# Patient Record
Sex: Female | Born: 1945 | Race: Black or African American | Hispanic: No | Marital: Married | Smoking: Never smoker
Health system: Southern US, Community
[De-identification: ages and names within clinical notes are randomized; demographics above are authoritative.]

## PROBLEM LIST (undated history)

## (undated) DIAGNOSIS — I129 Hypertensive chronic kidney disease with stage 1 through stage 4 chronic kidney disease, or unspecified chronic kidney disease: Secondary | ICD-10-CM

## (undated) DIAGNOSIS — E119 Type 2 diabetes mellitus without complications: Secondary | ICD-10-CM

## (undated) DIAGNOSIS — E7800 Pure hypercholesterolemia, unspecified: Secondary | ICD-10-CM

## (undated) DIAGNOSIS — I10 Essential (primary) hypertension: Secondary | ICD-10-CM

## (undated) DIAGNOSIS — I8390 Asymptomatic varicose veins of unspecified lower extremity: Secondary | ICD-10-CM

## (undated) DIAGNOSIS — H8110 Benign paroxysmal vertigo, unspecified ear: Secondary | ICD-10-CM

## (undated) DIAGNOSIS — M1990 Unspecified osteoarthritis, unspecified site: Secondary | ICD-10-CM

## (undated) DIAGNOSIS — Z973 Presence of spectacles and contact lenses: Secondary | ICD-10-CM

## (undated) DIAGNOSIS — E785 Hyperlipidemia, unspecified: Secondary | ICD-10-CM

## (undated) DIAGNOSIS — N183 Chronic kidney disease, stage 3 unspecified: Secondary | ICD-10-CM

## (undated) DIAGNOSIS — N189 Chronic kidney disease, unspecified: Secondary | ICD-10-CM

## (undated) HISTORY — DX: Pure hypercholesterolemia, unspecified: E78.00

## (undated) HISTORY — DX: Essential (primary) hypertension: I10

## (undated) HISTORY — DX: Unspecified osteoarthritis, unspecified site: M19.90

## (undated) HISTORY — DX: Benign paroxysmal vertigo, unspecified ear: H81.10

## (undated) HISTORY — PX: VAGINAL HYSTERECTOMY: SUR661

## (undated) HISTORY — PX: ABDOMINAL HYSTERECTOMY: SHX81

## (undated) HISTORY — DX: Chronic kidney disease, unspecified: N18.9

## (undated) HISTORY — DX: Type 2 diabetes mellitus without complications: E11.9

## (undated) HISTORY — DX: Asymptomatic varicose veins of unspecified lower extremity: I83.90

---

## 1997-10-28 ENCOUNTER — Other Ambulatory Visit: Admission: RE | Admit: 1997-10-28 | Discharge: 1997-10-28 | Payer: Self-pay | Admitting: Gynecology

## 1999-06-30 ENCOUNTER — Encounter: Payer: Self-pay | Admitting: Gynecology

## 1999-06-30 ENCOUNTER — Encounter: Admission: RE | Admit: 1999-06-30 | Discharge: 1999-06-30 | Payer: Self-pay | Admitting: Gynecology

## 2002-08-20 ENCOUNTER — Emergency Department (HOSPITAL_COMMUNITY): Admission: EM | Admit: 2002-08-20 | Discharge: 2002-08-20 | Payer: Self-pay | Admitting: *Deleted

## 2002-08-20 ENCOUNTER — Encounter: Payer: Self-pay | Admitting: *Deleted

## 2005-01-05 ENCOUNTER — Encounter: Admission: RE | Admit: 2005-01-05 | Discharge: 2005-01-05 | Payer: Self-pay | Admitting: Internal Medicine

## 2005-01-14 ENCOUNTER — Encounter: Admission: RE | Admit: 2005-01-14 | Discharge: 2005-01-14 | Payer: Self-pay | Admitting: Internal Medicine

## 2012-09-01 DIAGNOSIS — M25559 Pain in unspecified hip: Secondary | ICD-10-CM | POA: Diagnosis not present

## 2012-09-01 DIAGNOSIS — Z79899 Other long term (current) drug therapy: Secondary | ICD-10-CM | POA: Diagnosis not present

## 2012-09-01 DIAGNOSIS — E559 Vitamin D deficiency, unspecified: Secondary | ICD-10-CM | POA: Diagnosis not present

## 2012-09-01 DIAGNOSIS — I10 Essential (primary) hypertension: Secondary | ICD-10-CM | POA: Diagnosis not present

## 2012-10-13 DIAGNOSIS — Z1231 Encounter for screening mammogram for malignant neoplasm of breast: Secondary | ICD-10-CM | POA: Diagnosis not present

## 2013-01-08 DIAGNOSIS — I10 Essential (primary) hypertension: Secondary | ICD-10-CM | POA: Diagnosis not present

## 2013-01-08 DIAGNOSIS — R209 Unspecified disturbances of skin sensation: Secondary | ICD-10-CM | POA: Diagnosis not present

## 2013-05-24 DIAGNOSIS — R42 Dizziness and giddiness: Secondary | ICD-10-CM | POA: Diagnosis not present

## 2013-05-24 DIAGNOSIS — E7800 Pure hypercholesterolemia, unspecified: Secondary | ICD-10-CM | POA: Diagnosis not present

## 2013-05-24 DIAGNOSIS — E559 Vitamin D deficiency, unspecified: Secondary | ICD-10-CM | POA: Diagnosis not present

## 2013-05-24 DIAGNOSIS — Z0000 Encounter for general adult medical examination without abnormal findings: Secondary | ICD-10-CM | POA: Diagnosis not present

## 2013-05-24 DIAGNOSIS — I10 Essential (primary) hypertension: Secondary | ICD-10-CM | POA: Diagnosis not present

## 2013-05-25 DIAGNOSIS — I10 Essential (primary) hypertension: Secondary | ICD-10-CM | POA: Diagnosis not present

## 2013-05-25 DIAGNOSIS — E7800 Pure hypercholesterolemia, unspecified: Secondary | ICD-10-CM | POA: Diagnosis not present

## 2013-05-25 DIAGNOSIS — Z006 Encounter for examination for normal comparison and control in clinical research program: Secondary | ICD-10-CM | POA: Diagnosis not present

## 2013-10-15 DIAGNOSIS — I129 Hypertensive chronic kidney disease with stage 1 through stage 4 chronic kidney disease, or unspecified chronic kidney disease: Secondary | ICD-10-CM | POA: Diagnosis not present

## 2013-10-15 DIAGNOSIS — M25559 Pain in unspecified hip: Secondary | ICD-10-CM | POA: Diagnosis not present

## 2013-10-15 DIAGNOSIS — E1129 Type 2 diabetes mellitus with other diabetic kidney complication: Secondary | ICD-10-CM | POA: Diagnosis not present

## 2013-10-15 DIAGNOSIS — N058 Unspecified nephritic syndrome with other morphologic changes: Secondary | ICD-10-CM | POA: Diagnosis not present

## 2013-10-15 DIAGNOSIS — N183 Chronic kidney disease, stage 3 unspecified: Secondary | ICD-10-CM | POA: Diagnosis not present

## 2013-10-16 DIAGNOSIS — Z1231 Encounter for screening mammogram for malignant neoplasm of breast: Secondary | ICD-10-CM | POA: Diagnosis not present

## 2013-11-06 DIAGNOSIS — M1610 Unilateral primary osteoarthritis, unspecified hip: Secondary | ICD-10-CM | POA: Diagnosis not present

## 2013-11-06 DIAGNOSIS — M169 Osteoarthritis of hip, unspecified: Secondary | ICD-10-CM | POA: Diagnosis not present

## 2013-11-13 DIAGNOSIS — E7800 Pure hypercholesterolemia, unspecified: Secondary | ICD-10-CM | POA: Diagnosis not present

## 2013-11-13 DIAGNOSIS — Z79899 Other long term (current) drug therapy: Secondary | ICD-10-CM | POA: Diagnosis not present

## 2013-11-19 DIAGNOSIS — R42 Dizziness and giddiness: Secondary | ICD-10-CM | POA: Diagnosis not present

## 2013-11-19 DIAGNOSIS — H9319 Tinnitus, unspecified ear: Secondary | ICD-10-CM | POA: Diagnosis not present

## 2013-11-19 DIAGNOSIS — H8110 Benign paroxysmal vertigo, unspecified ear: Secondary | ICD-10-CM | POA: Diagnosis not present

## 2014-02-14 DIAGNOSIS — N183 Chronic kidney disease, stage 3 unspecified: Secondary | ICD-10-CM | POA: Diagnosis not present

## 2014-02-14 DIAGNOSIS — E1129 Type 2 diabetes mellitus with other diabetic kidney complication: Secondary | ICD-10-CM | POA: Diagnosis not present

## 2014-02-14 DIAGNOSIS — N058 Unspecified nephritic syndrome with other morphologic changes: Secondary | ICD-10-CM | POA: Diagnosis not present

## 2014-02-14 DIAGNOSIS — E1165 Type 2 diabetes mellitus with hyperglycemia: Secondary | ICD-10-CM | POA: Diagnosis not present

## 2014-02-14 DIAGNOSIS — I129 Hypertensive chronic kidney disease with stage 1 through stage 4 chronic kidney disease, or unspecified chronic kidney disease: Secondary | ICD-10-CM | POA: Diagnosis not present

## 2014-05-15 DIAGNOSIS — E876 Hypokalemia: Secondary | ICD-10-CM | POA: Diagnosis not present

## 2014-07-08 DIAGNOSIS — M25561 Pain in right knee: Secondary | ICD-10-CM | POA: Diagnosis not present

## 2014-07-08 DIAGNOSIS — E1121 Type 2 diabetes mellitus with diabetic nephropathy: Secondary | ICD-10-CM | POA: Diagnosis not present

## 2014-07-08 DIAGNOSIS — N390 Urinary tract infection, site not specified: Secondary | ICD-10-CM | POA: Diagnosis not present

## 2014-07-08 DIAGNOSIS — I129 Hypertensive chronic kidney disease with stage 1 through stage 4 chronic kidney disease, or unspecified chronic kidney disease: Secondary | ICD-10-CM | POA: Diagnosis not present

## 2014-07-08 DIAGNOSIS — Z13820 Encounter for screening for osteoporosis: Secondary | ICD-10-CM | POA: Diagnosis not present

## 2014-07-08 DIAGNOSIS — Z0000 Encounter for general adult medical examination without abnormal findings: Secondary | ICD-10-CM | POA: Diagnosis not present

## 2014-07-08 DIAGNOSIS — N183 Chronic kidney disease, stage 3 (moderate): Secondary | ICD-10-CM | POA: Diagnosis not present

## 2014-07-08 DIAGNOSIS — M85852 Other specified disorders of bone density and structure, left thigh: Secondary | ICD-10-CM | POA: Diagnosis not present

## 2014-10-07 DIAGNOSIS — E1122 Type 2 diabetes mellitus with diabetic chronic kidney disease: Secondary | ICD-10-CM | POA: Diagnosis not present

## 2014-10-07 DIAGNOSIS — N08 Glomerular disorders in diseases classified elsewhere: Secondary | ICD-10-CM | POA: Diagnosis not present

## 2014-10-07 DIAGNOSIS — Z23 Encounter for immunization: Secondary | ICD-10-CM | POA: Diagnosis not present

## 2014-10-07 DIAGNOSIS — N183 Chronic kidney disease, stage 3 (moderate): Secondary | ICD-10-CM | POA: Diagnosis not present

## 2014-10-07 DIAGNOSIS — I129 Hypertensive chronic kidney disease with stage 1 through stage 4 chronic kidney disease, or unspecified chronic kidney disease: Secondary | ICD-10-CM | POA: Diagnosis not present

## 2014-10-18 DIAGNOSIS — Z1231 Encounter for screening mammogram for malignant neoplasm of breast: Secondary | ICD-10-CM | POA: Diagnosis not present

## 2015-02-05 DIAGNOSIS — E1122 Type 2 diabetes mellitus with diabetic chronic kidney disease: Secondary | ICD-10-CM | POA: Diagnosis not present

## 2015-02-05 DIAGNOSIS — I129 Hypertensive chronic kidney disease with stage 1 through stage 4 chronic kidney disease, or unspecified chronic kidney disease: Secondary | ICD-10-CM | POA: Diagnosis not present

## 2015-02-05 DIAGNOSIS — N183 Chronic kidney disease, stage 3 (moderate): Secondary | ICD-10-CM | POA: Diagnosis not present

## 2015-02-05 DIAGNOSIS — N08 Glomerular disorders in diseases classified elsewhere: Secondary | ICD-10-CM | POA: Diagnosis not present

## 2015-02-19 DIAGNOSIS — M1990 Unspecified osteoarthritis, unspecified site: Secondary | ICD-10-CM | POA: Diagnosis not present

## 2015-02-20 ENCOUNTER — Other Ambulatory Visit: Payer: Self-pay | Admitting: Internal Medicine

## 2015-02-20 DIAGNOSIS — R200 Anesthesia of skin: Secondary | ICD-10-CM

## 2015-02-26 ENCOUNTER — Other Ambulatory Visit: Payer: Self-pay | Admitting: *Deleted

## 2015-02-26 DIAGNOSIS — I83813 Varicose veins of bilateral lower extremities with pain: Secondary | ICD-10-CM

## 2015-02-27 ENCOUNTER — Encounter: Payer: Self-pay | Admitting: Vascular Surgery

## 2015-04-21 ENCOUNTER — Encounter: Payer: Self-pay | Admitting: Vascular Surgery

## 2015-04-23 ENCOUNTER — Ambulatory Visit (HOSPITAL_COMMUNITY)
Admission: RE | Admit: 2015-04-23 | Discharge: 2015-04-23 | Disposition: A | Discharge status: Home / Self Care | Payer: Medicare Other | Source: Ambulatory Visit | Attending: Vascular Surgery | Admitting: Vascular Surgery

## 2015-04-23 ENCOUNTER — Ambulatory Visit (INDEPENDENT_AMBULATORY_CARE_PROVIDER_SITE_OTHER): Payer: Medicare Other | Admitting: Vascular Surgery

## 2015-04-23 ENCOUNTER — Encounter: Payer: Self-pay | Admitting: Vascular Surgery

## 2015-04-23 VITALS — BP 121/70 | HR 61 | Temp 98.3°F | Resp 16 | Ht 62.0 in | Wt 163.0 lb

## 2015-04-23 DIAGNOSIS — E119 Type 2 diabetes mellitus without complications: Secondary | ICD-10-CM | POA: Insufficient documentation

## 2015-04-23 DIAGNOSIS — N189 Chronic kidney disease, unspecified: Secondary | ICD-10-CM | POA: Diagnosis not present

## 2015-04-23 DIAGNOSIS — E7800 Pure hypercholesterolemia, unspecified: Secondary | ICD-10-CM | POA: Diagnosis not present

## 2015-04-23 DIAGNOSIS — I129 Hypertensive chronic kidney disease with stage 1 through stage 4 chronic kidney disease, or unspecified chronic kidney disease: Secondary | ICD-10-CM | POA: Diagnosis not present

## 2015-04-23 DIAGNOSIS — I8390 Asymptomatic varicose veins of unspecified lower extremity: Secondary | ICD-10-CM

## 2015-04-23 DIAGNOSIS — M25562 Pain in left knee: Secondary | ICD-10-CM | POA: Diagnosis not present

## 2015-04-23 DIAGNOSIS — I83813 Varicose veins of bilateral lower extremities with pain: Secondary | ICD-10-CM | POA: Diagnosis not present

## 2015-04-23 DIAGNOSIS — M25561 Pain in right knee: Secondary | ICD-10-CM | POA: Diagnosis not present

## 2015-04-23 HISTORY — DX: Asymptomatic varicose veins of unspecified lower extremity: I83.90

## 2015-04-23 NOTE — Progress Notes (Signed)
[facility] [place]  Patient name: Betty Arroyo Lex MRN: [id number] DOB: 1945/08/01 Sex: female  REASON FOR CONSULT: Bilateral leg pain. Referred by Dr. Dorothyann Pengobyn Sanders.   HPI: Betty Arroyo [place] is a 69 y.o. female, who has had pain in both lower extremities for a couple of years now. She describes mostly pain around her right knee and on the lateral aspect of her right knee and also some in the right hip. She has similar symptoms on the left side but more significantly on the right side. She does have some spider veins in her medial distal right thigh and giving these findings was sent for vascular evaluation.  She denies any history of claudication, rest pain, or nonhealing ulcers. No previous history of DVT or phlebitis.  I have reviewed her records that were sent with her from [facility]. She does have a history of diabetes and hypertension. She also has stage III chronic kidney disease.   Labs show an HDL of 45 with an LDL of 131. This was on 02/05/2015. GFR is greater than 60.   Past Medical History  Diagnosis Date  . Hypertension   . Hypercholesteremia   . Chronic kidney disease   . Diabetes mellitus without complication (HCC)   . Arthritis   . Vertigo, benign paroxysmal   . 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 Diabetes Mother   . Hyperlipidemia Mother   . Hypertension Mother   . Varicose Veins Mother   . Hyperlipidemia Brother   . Hypertension Brother     SOCIAL HISTORY: Social History   Social History  . Marital Status: Married    Spouse Name: N/A  . Number of Children: N/A  . Years of Education: N/A   Occupational History  . Not on file.   Social History Main Topics  . Smoking status: Never Smoker   . Smokeless tobacco: Never Used  . Alcohol Use: No  . Drug Use: No  . Sexual Activity: No   Other Topics Concern  . Not on file   Social History  Narrative    No Known Allergies  Current Outpatient Prescriptions  Medication Sig Dispense Refill  . carvedilol (COREG) 6.25 MG tablet Take 6.25 mg by mouth 2 (two) times daily with a meal.    . Cholecalciferol 50000 UNITS capsule Take 50,000 Units by mouth daily.    . rosuvastatin (CRESTOR) 40 MG tablet Take 40 mg by mouth daily.    . saxagliptin HCl (ONGLYZA) 2.5 MG TABS tablet Take 2.5 mg by mouth daily.    . traMADol (ULTRAM) 50 MG tablet as needed.  0  . valsartan-hydrochlorothiazide (DIOVAN-HCT) 320-25 MG per tablet Take 1 tablet by mouth daily.     No current facility-administered medications for this visit.    REVIEW OF SYSTEMS:  [X]  denotes positive finding, [ ]  denotes negative finding Cardiac  Comments:  Chest pain or chest pressure:    Shortness of breath upon exertion:    Short of breath when lying flat:    Irregular heart rhythm:        Vascular    Pain in calf, thigh, or hip brought on by ambulation:    Pain in feet at night that wakes you up from your sleep:     Blood clot in your veins:    Leg swelling:  X  Pulmonary    Oxygen at home:    Productive cough:     Wheezing:         Neurologic    Sudden weakness in arms or legs:     Sudden numbness in arms or legs:     Sudden onset of difficulty speaking or slurred speech:    Temporary loss of vision in one eye:     Problems with dizziness:         Gastrointestinal    Blood in stool:     Vomited blood:         Genitourinary    Burning when urinating:     Blood in urine:        Psychiatric    Major depression:         Hematologic    Bleeding problems:    Problems with blood clotting too easily:        Skin    Rashes or ulcers:        Constitutional    Fever or chills:      PHYSICAL EXAM: Filed Vitals:   04/23/15 1002  BP: 121/70  Pulse: 61  Temp: 98.3 F (36.8 C)  TempSrc: Oral  Resp: 16  Height:  (1.575 m)  Weight: 163 lb (73.936 kg)  SpO2: 97%    GENERAL: The  patient is a well-nourished female, in no acute distress. The vital signs are documented above. CARDIAC: There is a regular rate and rhythm.  VASCULAR: I do not do detect carotid bruits. She has palpable femoral pulses dorsalis pedis pulses bilaterally. She has biphasic dorsalis pedis and posterior tibial signals with the Doppler bilaterally. PULMONARY: There is good air exchange bilaterally without wheezing or rales. ABDOMEN: Soft and non-tender with normal pitched bowel sounds.  MUSCULOSKELETAL: There are no major deformities or cyanosis. NEUROLOGIC: No focal weakness or paresthesias are detected. SKIN: There are no ulcers or rashes noted. She does have some telangiectasias along the distal medial aspect of her right thigh. PSYCHIATRIC: The patient has a normal affect.  DATA:  LOWER EXTREMITY VENOUS DUPLEX: I have independently interpreted her lower extremity venous duplex scan.  On the right side, there is no evidence of DVT. There is no evidence of deep vein reflux or superficial vein reflux.  On the left side, there is no evidence of DVT. There is no evidence of deep vein reflux or superficial vein reflux.  MEDICAL ISSUES:  LEG PAIN: Her pain is mostly on the right side and is mostly in the right knee and hip. I think this is related to arthritis. She has been evaluated for this and apparently was told that she has arthritis. I reassured her that she has palpable pedal pulses and normal Doppler signals and therefore has no evidence of significant arterial insufficiency. Fortunately she is not a smoker. In addition, her venous duplex scan shows no evidence of significant venous disease. There is no DVT, deep vein reflux, or reflux in the superficial system. I have explained that she could undergo sclerotherapy for her telangiectasias but this would not help her knee pain on the right. I'll be happy to see her back at any time if any vascular issues arise.   Waverly Ferrari [facility] [place] Beeper: [phone]

## 2015-04-24 DIAGNOSIS — E1122 Type 2 diabetes mellitus with diabetic chronic kidney disease: Secondary | ICD-10-CM | POA: Diagnosis not present

## 2015-04-24 DIAGNOSIS — I129 Hypertensive chronic kidney disease with stage 1 through stage 4 chronic kidney disease, or unspecified chronic kidney disease: Secondary | ICD-10-CM | POA: Diagnosis not present

## 2015-04-24 DIAGNOSIS — N08 Glomerular disorders in diseases classified elsewhere: Secondary | ICD-10-CM | POA: Diagnosis not present

## 2015-04-24 DIAGNOSIS — N183 Chronic kidney disease, stage 3 (moderate): Secondary | ICD-10-CM | POA: Diagnosis not present

## 2015-08-14 DIAGNOSIS — E1122 Type 2 diabetes mellitus with diabetic chronic kidney disease: Secondary | ICD-10-CM | POA: Diagnosis not present

## 2015-08-14 DIAGNOSIS — E559 Vitamin D deficiency, unspecified: Secondary | ICD-10-CM | POA: Diagnosis not present

## 2015-08-14 DIAGNOSIS — N183 Chronic kidney disease, stage 3 (moderate): Secondary | ICD-10-CM | POA: Diagnosis not present

## 2015-08-14 DIAGNOSIS — I129 Hypertensive chronic kidney disease with stage 1 through stage 4 chronic kidney disease, or unspecified chronic kidney disease: Secondary | ICD-10-CM | POA: Diagnosis not present

## 2015-08-14 DIAGNOSIS — N08 Glomerular disorders in diseases classified elsewhere: Secondary | ICD-10-CM | POA: Diagnosis not present

## 2015-08-14 DIAGNOSIS — Z0000 Encounter for general adult medical examination without abnormal findings: Secondary | ICD-10-CM | POA: Diagnosis not present

## 2015-10-20 DIAGNOSIS — Z1231 Encounter for screening mammogram for malignant neoplasm of breast: Secondary | ICD-10-CM | POA: Diagnosis not present

## 2015-11-19 DIAGNOSIS — N182 Chronic kidney disease, stage 2 (mild): Secondary | ICD-10-CM | POA: Diagnosis not present

## 2015-11-19 DIAGNOSIS — I129 Hypertensive chronic kidney disease with stage 1 through stage 4 chronic kidney disease, or unspecified chronic kidney disease: Secondary | ICD-10-CM | POA: Diagnosis not present

## 2015-11-19 DIAGNOSIS — E1122 Type 2 diabetes mellitus with diabetic chronic kidney disease: Secondary | ICD-10-CM | POA: Diagnosis not present

## 2015-11-19 DIAGNOSIS — N08 Glomerular disorders in diseases classified elsewhere: Secondary | ICD-10-CM | POA: Diagnosis not present

## 2015-12-12 DIAGNOSIS — M25561 Pain in right knee: Secondary | ICD-10-CM | POA: Diagnosis not present

## 2015-12-12 DIAGNOSIS — M25562 Pain in left knee: Secondary | ICD-10-CM | POA: Diagnosis not present

## 2015-12-12 DIAGNOSIS — M1712 Unilateral primary osteoarthritis, left knee: Secondary | ICD-10-CM | POA: Diagnosis not present

## 2015-12-12 DIAGNOSIS — M1711 Unilateral primary osteoarthritis, right knee: Secondary | ICD-10-CM | POA: Diagnosis not present

## 2016-01-02 ENCOUNTER — Ambulatory Visit (INDEPENDENT_AMBULATORY_CARE_PROVIDER_SITE_OTHER): Payer: Medicare Other

## 2016-01-02 ENCOUNTER — Ambulatory Visit (HOSPITAL_COMMUNITY)
Admission: EM | Admit: 2016-01-02 | Discharge: 2016-01-02 | Disposition: A | Discharge status: Home / Self Care | Payer: Medicare Other | Attending: Internal Medicine | Admitting: Internal Medicine

## 2016-01-02 ENCOUNTER — Encounter (HOSPITAL_COMMUNITY): Payer: Self-pay | Admitting: Emergency Medicine

## 2016-01-02 DIAGNOSIS — S62635A Displaced fracture of distal phalanx of left ring finger, initial encounter for closed fracture: Secondary | ICD-10-CM | POA: Diagnosis not present

## 2016-01-02 DIAGNOSIS — S62609A Fracture of unspecified phalanx of unspecified finger, initial encounter for closed fracture: Secondary | ICD-10-CM

## 2016-01-02 NOTE — ED Notes (Signed)
PT jammed her left ring finger last night. PT reports it is throbbing and she feels like her nail is going to fall off. PT is diabetic and her PCP wanted her to get it looked at.

## 2016-01-02 NOTE — ED Provider Notes (Signed)
CSN: [id number]     Arrival date & time 01/02/16  1804 History   First MD Initiated Contact with Patient 01/02/16 1937     Chief Complaint  Patient presents with  . Finger Injury   (Consider location/radiation/quality/duration/timing/severity/associated sxs/prior Treatment) HPI Comments: Patient presents with pain to left ring finger tip. She jammed it in the bed trying to make it. Feels like nail is loose. Mild swelling in the pad and painful at tip.   The history is provided by the patient.    Past Medical History  Diagnosis Date  . Hypertension   . Hypercholesteremia   . Chronic kidney disease   . Diabetes mellitus without complication (HCC)   . Arthritis   . Vertigo, benign paroxysmal   . 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 Abdominal hysterectomy     Family History  Problem Relation Age of Onset  . Diabetes Mother   . Hyperlipidemia Mother   . Hypertension Mother   . Varicose Veins Mother   . Hyperlipidemia Brother   . Hypertension Brother    Social History  Substance Use Topics  . Smoking status: Never Smoker   . Smokeless tobacco: Never Used  . Alcohol Use: No   OB History    No data available     Review of Systems  Allergies  Review of patient's allergies indicates no known allergies.  Home Medications   Prior to Admission medications   Medication Sig Start Date End Date Taking? Authorizing Provider  carvedilol (COREG) 6.25 MG tablet Take 6.25 mg by mouth 2 (two) times daily with a meal.    Historical Provider, MD  Cholecalciferol 50000 UNITS capsule Take 50,000 Units by mouth daily.    Historical Provider, MD  rosuvastatin (CRESTOR) 40 MG tablet Take 40 mg by mouth daily.    Historical Provider, MD  saxagliptin HCl (ONGLYZA) 2.5 MG TABS tablet Take 2.5 mg by mouth daily.    Historical Provider, MD  traMADol (ULTRAM) 50 MG tablet as needed. 02/19/15   Historical Provider, MD   valsartan-hydrochlorothiazide (DIOVAN-HCT) 320-25 MG per tablet Take 1 tablet by mouth daily.    Historical Provider, MD   Meds Ordered and Administered this Visit  Medications - No data to display  BP 141/62 mmHg  Pulse 56  Temp(Src) 98.4 F (36.9 C) (Oral)  Resp 16  SpO2 100% No data found.   Physical Exam  Constitutional: She is oriented to person, place, and time. She appears well-developed and well-nourished. No distress.  Musculoskeletal: She exhibits edema and tenderness.  Left ring finger with mild swelling in the finger pad, tenderness along the tuft. No nail injury and nail is mildly loose. Pain with ROM of the DIP. Full and non painful ROM of the PIP and MCP.  Neurological: She is alert and oriented to person, place, and time.  Skin: Skin is warm and dry. She is not diaphoretic.  Psychiatric: Her behavior is normal.  Nursing note and vitals reviewed.   ED Course  Procedures (including critical care time)  Labs Review Labs Reviewed - No data to display  Imaging Review Dg Hand Complete Left  01/02/2016  CLINICAL DATA:  Jammed ring finger in sheets trying to make bed. Nail is loose and painful. EXAM: LEFT HAND - COMPLETE 3+ VIEW COMPARISON:  None. FINDINGS: Three views study shows a transverse fracture through the mid aspect of the ring finger distal phalanx. No  other acute bony abnormality is evident. IMPRESSION: Transverse fracture involving the distal phalanx of the ring finger. Electronically Signed   By: Kennith Center M.D.   On: 01/02/2016 20:01     Visual Acuity Review  Right Eye Distance:   Left Eye Distance:   Bilateral Distance:    Right Eye Near:   Left Eye Near:    Bilateral Near:         MDM   1. Finger fracture, left, closed, initial encounter    Non-displaced. Applied stack splint for protection. May take off to shower. Keep in place up to 2 weeks or as needed. Suspect this will do fine, if issues then instructed to f/u with Ortho.     Riki Sheer, PA-C 01/02/16 2128

## 2016-01-02 NOTE — Discharge Instructions (Signed)
You have a transverse finger fracture. Should heal nicely without problem. Keep the splint in place with use, bedtime and for 2 weeks to ensure healing. Then if better may just use if needed for comfort and for protection. May take OTC Advil or Tylenol if needed for pain. F/U with Ortho if worsens.   Finger Fracture Fractures of fingers are breaks in the bones of the fingers. There are many types of fractures. There are different ways of treating these fractures. Your health care provider will discuss the best way to treat your fracture. CAUSES Traumatic injury is the main cause of broken fingers. These include:  Injuries while playing sports.  Workplace injuries.  Falls. RISK FACTORS Activities that can increase your risk of finger fractures include:  Sports.  Workplace activities that involve machinery.  A condition called osteoporosis, which can make your bones less dense and cause them to fracture more easily. SIGNS AND SYMPTOMS The main symptoms of a broken finger are pain and swelling within 15 minutes after the injury. Other symptoms include:  Bruising of your finger.  Stiffness of your finger.  Numbness of your finger.  Exposed bones (compound fracture) if the fracture is severe. DIAGNOSIS  The best way to diagnose a broken bone is with X-ray imaging. Additionally, your health care provider will use this X-ray image to evaluate the position of the broken finger bones.  TREATMENT  Finger fractures can be treated with:   Nonreduction--This means the bones are in place. The finger is splinted without changing the positions of the bone pieces. The splint is usually left on for about a week to 10 days. This will depend on your fracture and what your health care provider thinks.  Closed reduction--The bones are put back into position without using surgery. The finger is then splinted.  Open reduction and internal fixation--The fracture site is opened. Then the bone pieces are  fixed into place with pins or some type of hardware. This is seldom required. It depends on the severity of the fracture. HOME CARE INSTRUCTIONS   Follow your health care provider's instructions regarding activities, exercises, and physical therapy.  Only take over-the-counter or prescription medicines for pain, discomfort, or fever as directed by your health care provider. SEEK MEDICAL CARE IF: You have pain or swelling that limits the motion or use of your fingers. SEEK IMMEDIATE MEDICAL CARE IF:  Your finger becomes numb. MAKE SURE YOU:   Understand these instructions.  Will watch your condition.  Will get help right away if you are not doing well or get worse.   This information is not intended to replace advice given to you by your health care provider. Make sure you discuss any questions you have with your health care provider.   Document Released: 09/19/2000 Document Revised: 03/28/2013 Document Reviewed: 01/17/2013 Elsevier Interactive Patient Education [employer]2016 Elsevier Inc.

## 2016-02-05 DIAGNOSIS — I129 Hypertensive chronic kidney disease with stage 1 through stage 4 chronic kidney disease, or unspecified chronic kidney disease: Secondary | ICD-10-CM | POA: Diagnosis not present

## 2016-02-05 DIAGNOSIS — N08 Glomerular disorders in diseases classified elsewhere: Secondary | ICD-10-CM | POA: Diagnosis not present

## 2016-02-05 DIAGNOSIS — N182 Chronic kidney disease, stage 2 (mild): Secondary | ICD-10-CM | POA: Diagnosis not present

## 2016-02-05 DIAGNOSIS — E1122 Type 2 diabetes mellitus with diabetic chronic kidney disease: Secondary | ICD-10-CM | POA: Diagnosis not present

## 2016-04-01 DIAGNOSIS — M25561 Pain in right knee: Secondary | ICD-10-CM | POA: Diagnosis not present

## 2016-04-01 DIAGNOSIS — M25562 Pain in left knee: Secondary | ICD-10-CM | POA: Diagnosis not present

## 2016-04-01 DIAGNOSIS — M170 Bilateral primary osteoarthritis of knee: Secondary | ICD-10-CM | POA: Diagnosis not present

## 2016-04-01 DIAGNOSIS — R262 Difficulty in walking, not elsewhere classified: Secondary | ICD-10-CM | POA: Diagnosis not present

## 2016-04-08 DIAGNOSIS — M25562 Pain in left knee: Secondary | ICD-10-CM | POA: Diagnosis not present

## 2016-04-08 DIAGNOSIS — M170 Bilateral primary osteoarthritis of knee: Secondary | ICD-10-CM | POA: Diagnosis not present

## 2016-04-08 DIAGNOSIS — M25561 Pain in right knee: Secondary | ICD-10-CM | POA: Diagnosis not present

## 2016-04-08 DIAGNOSIS — R262 Difficulty in walking, not elsewhere classified: Secondary | ICD-10-CM | POA: Diagnosis not present

## 2016-04-13 DIAGNOSIS — M1712 Unilateral primary osteoarthritis, left knee: Secondary | ICD-10-CM | POA: Diagnosis not present

## 2016-04-13 DIAGNOSIS — M25562 Pain in left knee: Secondary | ICD-10-CM | POA: Diagnosis not present

## 2016-04-15 DIAGNOSIS — M25562 Pain in left knee: Secondary | ICD-10-CM | POA: Diagnosis not present

## 2016-04-15 DIAGNOSIS — M1712 Unilateral primary osteoarthritis, left knee: Secondary | ICD-10-CM | POA: Diagnosis not present

## 2016-04-20 DIAGNOSIS — M1712 Unilateral primary osteoarthritis, left knee: Secondary | ICD-10-CM | POA: Diagnosis not present

## 2016-04-20 DIAGNOSIS — M25562 Pain in left knee: Secondary | ICD-10-CM | POA: Diagnosis not present

## 2016-04-22 DIAGNOSIS — M1711 Unilateral primary osteoarthritis, right knee: Secondary | ICD-10-CM | POA: Diagnosis not present

## 2016-04-22 DIAGNOSIS — M25561 Pain in right knee: Secondary | ICD-10-CM | POA: Diagnosis not present

## 2016-04-27 DIAGNOSIS — M25562 Pain in left knee: Secondary | ICD-10-CM | POA: Diagnosis not present

## 2016-04-27 DIAGNOSIS — M1712 Unilateral primary osteoarthritis, left knee: Secondary | ICD-10-CM | POA: Diagnosis not present

## 2016-04-29 DIAGNOSIS — M25561 Pain in right knee: Secondary | ICD-10-CM | POA: Diagnosis not present

## 2016-04-29 DIAGNOSIS — M1711 Unilateral primary osteoarthritis, right knee: Secondary | ICD-10-CM | POA: Diagnosis not present

## 2016-05-04 DIAGNOSIS — M170 Bilateral primary osteoarthritis of knee: Secondary | ICD-10-CM | POA: Diagnosis not present

## 2016-05-04 DIAGNOSIS — M25561 Pain in right knee: Secondary | ICD-10-CM | POA: Diagnosis not present

## 2016-05-04 DIAGNOSIS — M25562 Pain in left knee: Secondary | ICD-10-CM | POA: Diagnosis not present

## 2016-05-27 DIAGNOSIS — N08 Glomerular disorders in diseases classified elsewhere: Secondary | ICD-10-CM | POA: Diagnosis not present

## 2016-05-27 DIAGNOSIS — E1122 Type 2 diabetes mellitus with diabetic chronic kidney disease: Secondary | ICD-10-CM | POA: Diagnosis not present

## 2016-05-27 DIAGNOSIS — I129 Hypertensive chronic kidney disease with stage 1 through stage 4 chronic kidney disease, or unspecified chronic kidney disease: Secondary | ICD-10-CM | POA: Diagnosis not present

## 2016-05-27 DIAGNOSIS — N182 Chronic kidney disease, stage 2 (mild): Secondary | ICD-10-CM | POA: Diagnosis not present

## 2017-06-21 HISTORY — PX: COLONOSCOPY: SHX174

## 2017-12-26 ENCOUNTER — Other Ambulatory Visit: Payer: Self-pay | Admitting: Internal Medicine

## 2017-12-26 DIAGNOSIS — N183 Chronic kidney disease, stage 3 unspecified: Secondary | ICD-10-CM

## 2017-12-26 DIAGNOSIS — R809 Proteinuria, unspecified: Secondary | ICD-10-CM

## 2017-12-26 DIAGNOSIS — N179 Acute kidney failure, unspecified: Secondary | ICD-10-CM

## 2018-01-03 ENCOUNTER — Ambulatory Visit
Admission: RE | Admit: 2018-01-03 | Discharge: 2018-01-03 | Disposition: A | Discharge status: Home / Self Care | Payer: Medicare Other | Source: Ambulatory Visit | Attending: Internal Medicine | Admitting: Internal Medicine

## 2018-01-03 DIAGNOSIS — N183 Chronic kidney disease, stage 3 unspecified: Secondary | ICD-10-CM

## 2018-01-03 DIAGNOSIS — N179 Acute kidney failure, unspecified: Secondary | ICD-10-CM

## 2018-01-03 DIAGNOSIS — R809 Proteinuria, unspecified: Secondary | ICD-10-CM

## 2018-01-09 DIAGNOSIS — N183 Chronic kidney disease, stage 3 (moderate): Secondary | ICD-10-CM | POA: Diagnosis not present

## 2018-03-15 DIAGNOSIS — N183 Chronic kidney disease, stage 3 (moderate): Secondary | ICD-10-CM | POA: Diagnosis not present

## 2018-03-15 DIAGNOSIS — N08 Glomerular disorders in diseases classified elsewhere: Secondary | ICD-10-CM

## 2018-03-15 DIAGNOSIS — E1122 Type 2 diabetes mellitus with diabetic chronic kidney disease: Secondary | ICD-10-CM | POA: Diagnosis not present

## 2018-03-15 DIAGNOSIS — I129 Hypertensive chronic kidney disease with stage 1 through stage 4 chronic kidney disease, or unspecified chronic kidney disease: Secondary | ICD-10-CM | POA: Diagnosis not present

## 2018-03-15 DIAGNOSIS — Z79899 Other long term (current) drug therapy: Secondary | ICD-10-CM

## 2018-03-15 DIAGNOSIS — Z86010 Personal history of colonic polyps: Secondary | ICD-10-CM

## 2018-03-27 ENCOUNTER — Other Ambulatory Visit: Payer: Self-pay

## 2018-03-27 MED ORDER — ROSUVASTATIN CALCIUM 40 MG PO TABS: 40.0000 mg | ORAL_TABLET | Freq: Every day | ORAL | 0 refills | Status: DC

## 2018-03-31 ENCOUNTER — Other Ambulatory Visit: Payer: Self-pay

## 2018-03-31 MED ORDER — ROSUVASTATIN CALCIUM 40 MG PO TABS: 40.0000 mg | ORAL_TABLET | Freq: Every day | ORAL | 0 refills | Status: DC

## 2018-04-06 ENCOUNTER — Other Ambulatory Visit: Payer: Self-pay

## 2018-04-06 NOTE — Patient Outreach (Signed)
[facility]) Care Management  04/06/2018  ENYLA LISBON 02-07-1946 [id number]   Medication Adherence call to Mrs. Julieanna Geraci left a message for patient to call back patient is due on  Telmisartan/ HCTZ 80/25 mg under [facility] Ins.   Lillia Abed CPhT Pharmacy Technician [facility] Management Direct Dial [phone]  Fax [phone] [email or web address]

## 2018-04-16 ENCOUNTER — Other Ambulatory Visit: Payer: Self-pay | Admitting: Internal Medicine

## 2018-05-03 ENCOUNTER — Other Ambulatory Visit: Payer: Self-pay

## 2018-05-03 ENCOUNTER — Telehealth: Payer: Self-pay

## 2018-05-03 MED ORDER — MECLIZINE HCL 25 MG PO TABS: 25.0000 mg | ORAL_TABLET | Freq: Three times a day (TID) | ORAL | 0 refills | Status: DC | PRN

## 2018-05-03 NOTE — Telephone Encounter (Signed)
The pt was notified that Dr. Allyne GeeSanders sent in her refill of meclizine for the pt's vertigo.

## 2018-06-21 ENCOUNTER — Other Ambulatory Visit: Payer: Self-pay | Admitting: Internal Medicine

## 2018-07-19 ENCOUNTER — Ambulatory Visit: Payer: Self-pay | Admitting: Internal Medicine

## 2018-07-24 ENCOUNTER — Encounter: Payer: Self-pay | Admitting: Internal Medicine

## 2018-07-24 ENCOUNTER — Ambulatory Visit: Payer: Medicare Other | Admitting: Internal Medicine

## 2018-07-24 VITALS — BP 128/74 | HR 64 | Temp 97.7°F | Ht 61.0 in | Wt 157.2 lb

## 2018-07-24 DIAGNOSIS — N760 Acute vaginitis: Secondary | ICD-10-CM

## 2018-07-24 DIAGNOSIS — E7800 Pure hypercholesterolemia, unspecified: Secondary | ICD-10-CM | POA: Diagnosis not present

## 2018-07-24 DIAGNOSIS — N183 Chronic kidney disease, stage 3 unspecified: Secondary | ICD-10-CM | POA: Insufficient documentation

## 2018-07-24 DIAGNOSIS — Z23 Encounter for immunization: Secondary | ICD-10-CM | POA: Diagnosis not present

## 2018-07-24 DIAGNOSIS — I129 Hypertensive chronic kidney disease with stage 1 through stage 4 chronic kidney disease, or unspecified chronic kidney disease: Secondary | ICD-10-CM | POA: Diagnosis not present

## 2018-07-24 DIAGNOSIS — E1122 Type 2 diabetes mellitus with diabetic chronic kidney disease: Secondary | ICD-10-CM

## 2018-07-24 DIAGNOSIS — Z6829 Body mass index (BMI) 29.0-29.9, adult: Secondary | ICD-10-CM

## 2018-07-24 NOTE — Progress Notes (Signed)
Subjective:     Patient ID: Betty Arroyo , female    DOB: 1946-05-25 , 73 y.o.   MRN: [id number]   Chief Complaint  Patient presents with  . Diabetes  . Hypertension    HPI  Diabetes  She presents for her follow-up diabetic visit. She has type 2 diabetes mellitus. Her disease course has been stable. There are no hypoglycemic associated symptoms. Pertinent negatives for diabetes include no blurred vision, no chest pain and no fatigue. There are no hypoglycemic complications. Diabetic complications include nephropathy. Risk factors for coronary artery disease include diabetes mellitus, dyslipidemia, hypertension and post-menopausal. Her weight is stable. Her breakfast blood glucose is taken between 8-9 am. Her breakfast blood glucose range is generally 90-110 mg/dl. Her dinner blood glucose is taken between 5-6 pm. Her dinner blood glucose range is generally 130-140 mg/dl. An ACE inhibitor/angiotensin II receptor blocker is being taken.  Hypertension  This is a chronic problem. The current episode started more than 1 year ago. The problem has been gradually improving since onset. The problem is controlled. Pertinent negatives include no blurred vision or chest pain.   She reports compliance with meds.   Past Medical History:  Diagnosis Date  . Arthritis   . Chronic kidney disease   . Diabetes mellitus without complication (HCC)   . Hypercholesteremia   . Hypertension   . 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 Vertigo, benign paroxysmal      Family History  Problem Relation Age of Onset  . Diabetes Mother   . Hyperlipidemia Mother   . Hypertension Mother   . Varicose Veins Mother   . Hyperlipidemia Brother   . Hypertension Brother      Current Outpatient Medications:  .  carvedilol (COREG) 6.25 MG tablet, TAKE 2 TABLETS BY MOUTH TWICE DAILY WITH FOOD, Disp: 120 tablet, Rfl: 0 .  Cholecalciferol (VITAMIN D3) 50 MCG (2000 UT) capsule, Take 2,000 Units by mouth daily.,  Disp: , Rfl:  .  JANUVIA 100 MG tablet, Take 100 mg by mouth 3 (three) times a week. , Disp: , Rfl:  .  telmisartan-hydrochlorothiazide (MICARDIS HCT) 80-25 MG tablet, Take 1 tablet by mouth daily., Disp: , Rfl:  .  meclizine (ANTIVERT) 25 MG tablet, Take 1 tablet (25 mg total) by mouth 3 (three) times daily as needed for dizziness. (Patient not taking: Reported on 07/24/2018), Disp: 30 tablet, Rfl: 0 .  traMADol (ULTRAM) 50 MG tablet, as needed., Disp: , Rfl: 0   No Known Allergies   Review of Systems  Constitutional: Negative.  Negative for fatigue.  Eyes: Negative for blurred vision.  Respiratory: Negative.   Cardiovascular: Negative.  Negative for chest pain.  Gastrointestinal: Negative.   Genitourinary: Positive for vaginal discharge (she c/o white, odorless vaginal d/c. not sure what may have triggered her sx. first noticed a couple of weeks ago. no vaginal itching).  Neurological: Negative.   Psychiatric/Behavioral: Negative.      Today's Vitals   07/24/18 1144  BP: 128/74  Pulse: 64  Temp: 97.7 F (36.5 C)  TempSrc: Oral  Weight: 157 lb 3.2 oz (71.3 kg)  Height: 5\' 1"  (1.549 m)   Body mass index is 29.7 kg/m.   Objective:  Physical Exam Vitals signs and nursing note reviewed.  Constitutional:      Appearance: Normal appearance.  HENT:     Head: Normocephalic and atraumatic.  Cardiovascular:     Rate and Rhythm: Normal rate and regular rhythm.  Heart sounds: Normal heart sounds.  Pulmonary:     Effort: Pulmonary effort is normal.     Breath sounds: Normal breath sounds.  Genitourinary:    General: Normal vulva.     Exam position: Lithotomy position.     Vagina: Vaginal discharge present.     Uterus: Absent.      Comments: Whitish, cottage cheese d/c present. No odor.  Skin:    General: Skin is warm.  Neurological:     General: No focal deficit present.     Mental Status: She is alert and oriented to person, place, and time.  Psychiatric:        Mood  and Affect: Mood normal.         Assessment And Plan:     1. Type 2 diabetes mellitus with stage 3 chronic kidney disease, without long-term current use of insulin (HCC)  She reports recently having bloodwork drawn at her nephrologist's office. I will request these records. She reports hba1c was 6.1. She will rto in June 2020 for a physical examination. She was advised that I will check CBC, CMET, hba1c and LIPID at that time.   2. Parenchymal renal hypertension, stage 1 through stage 4 or unspecified chronic kidney disease  Well controlled. She will continue with current meds. She is encouraged to avoid adding salt to her foods.  3. Pure hypercholesterolemia  I will check fasting lipid panel at her next visit. She is encouraged to avoid fried foods and to exercise no less than 150 minutes per week.  4. Acute vaginitis  I will check Nuswab today. I will make further recommendations once her labs are available for review.   - NuSwab Vaginitis Plus (VG+)  5. Body mass index (BMI) of 29.0 to 29.9 in adult  She is encouraged to exercise no less than five days weekly for at least 30 minutes.  She should strive for BMI less than 27 to decrease cardiac risk.   6. Flu vaccine need   - Flu vaccine HIGH DOSE PF (Fluzone High dose)        [name], MD

## 2018-07-27 ENCOUNTER — Other Ambulatory Visit: Payer: Self-pay

## 2018-07-27 ENCOUNTER — Telehealth: Payer: Self-pay

## 2018-07-27 LAB — NUSWAB VAGINITIS PLUS (VG+)
Candida albicans, NAA: POSITIVE — AB
Candida glabrata, NAA: NEGATIVE
Chlamydia trachomatis, NAA: NEGATIVE
Neisseria gonorrhoeae, NAA: NEGATIVE
Trich vag by NAA: NEGATIVE

## 2018-07-27 MED ORDER — FLUCONAZOLE 150 MG PO TABS: 150.0000 mg | ORAL_TABLET | Freq: Once | ORAL | 0 refills | Status: AC

## 2018-07-27 NOTE — Telephone Encounter (Signed)
Left a message for the pt to call back for lab results. 

## 2018-07-27 NOTE — Telephone Encounter (Signed)
-----   Message from Dorothyann Peng, MD sent at 07/27/2018  7:44 AM EST ----- She has a yeast infection. Kw-pls send in diflucan 150mg  x 1. Advise her to stop chol meds for now. Resume on Sunday.

## 2018-07-31 ENCOUNTER — Other Ambulatory Visit: Payer: Self-pay | Admitting: Internal Medicine

## 2018-08-01 ENCOUNTER — Other Ambulatory Visit: Payer: Self-pay | Admitting: Internal Medicine

## 2018-08-24 ENCOUNTER — Encounter: Payer: Self-pay | Admitting: Internal Medicine

## 2018-08-25 ENCOUNTER — Other Ambulatory Visit: Payer: Self-pay

## 2018-08-25 NOTE — Patient Outreach (Signed)
[facility]) Care Management  08/25/2018  Betty Arroyo 19-Mar-1946 [id number]   Medication Adherence call to Mrs. Leafy Half HIPPA Compliant Voice message left with a call back number.Telephone call to Patient regarding Medication Adherence. [name] is showing past due under [facility] Ins. On Jarnuvia 100 mg.   Lillia Abed CPhT Pharmacy Technician [facility] Care Management Direct Dial [phone]  Fax [phone] [email or web address]

## 2018-10-05 ENCOUNTER — Telehealth: Payer: Self-pay

## 2018-10-05 NOTE — Telephone Encounter (Signed)
Patient called stating she is having some wrist and thumb pain wants to know what should she do.  RETURNED HER CALL AND SCHEDULED APPT. YRL,RMA

## 2018-10-09 ENCOUNTER — Telehealth: Payer: Self-pay

## 2018-10-09 NOTE — Telephone Encounter (Signed)
Patient called to cancel her appointment she stated her thumb and wrist felt better. YRL,RMA

## 2018-10-10 ENCOUNTER — Ambulatory Visit: Payer: Self-pay | Admitting: Internal Medicine

## 2018-12-01 LAB — HM MAMMOGRAPHY: HM Mammogram: NORMAL (ref 0–4)

## 2018-12-06 ENCOUNTER — Encounter: Payer: Self-pay | Admitting: Internal Medicine

## 2018-12-06 ENCOUNTER — Ambulatory Visit (INDEPENDENT_AMBULATORY_CARE_PROVIDER_SITE_OTHER): Payer: Medicare Other | Admitting: Internal Medicine

## 2018-12-06 ENCOUNTER — Ambulatory Visit: Payer: Medicare Other

## 2018-12-06 ENCOUNTER — Other Ambulatory Visit: Payer: Self-pay

## 2018-12-06 VITALS — BP 142/78 | HR 76 | Temp 98.0°F | Ht 61.0 in | Wt 159.8 lb

## 2018-12-06 DIAGNOSIS — Z0000 Encounter for general adult medical examination without abnormal findings: Secondary | ICD-10-CM | POA: Diagnosis not present

## 2018-12-06 DIAGNOSIS — N183 Chronic kidney disease, stage 3 (moderate): Secondary | ICD-10-CM | POA: Diagnosis not present

## 2018-12-06 DIAGNOSIS — E1122 Type 2 diabetes mellitus with diabetic chronic kidney disease: Secondary | ICD-10-CM

## 2018-12-06 DIAGNOSIS — I129 Hypertensive chronic kidney disease with stage 1 through stage 4 chronic kidney disease, or unspecified chronic kidney disease: Secondary | ICD-10-CM | POA: Diagnosis not present

## 2018-12-06 LAB — POCT URINALYSIS DIPSTICK
Bilirubin, UA: NEGATIVE
Glucose, UA: NEGATIVE
Ketones, UA: NEGATIVE
Leukocytes, UA: NEGATIVE
Nitrite, UA: NEGATIVE
Protein, UA: POSITIVE — AB
Spec Grav, UA: 1.02 (ref 1.010–1.025)
Urobilinogen, UA: 0.2 E.U./dL
pH, UA: 5.5 (ref 5.0–8.0)

## 2018-12-06 LAB — POCT UA - MICROALBUMIN
Creatinine, POC: 300 mg/dL
Microalbumin Ur, POC: 80 mg/L

## 2018-12-06 NOTE — Progress Notes (Signed)
Subjective:   Betty Arroyo is a 73 y.o. female who presents for Medicare Annual (Subsequent) preventive examination.  Review of Systems:  n/a Cardiac Risk Factors include: advanced age (>3155men, 36>65 women);diabetes mellitus;hypertension;sedentary lifestyle;obesity (BMI >30kg/m2)     Objective:     Vitals: BP (!) 142/78 (BP Location: Left Arm, Patient Position: Sitting, Cuff Size: Normal)   Pulse 76   Temp 98 F (36.7 C) (Oral)   Ht 5\' 1"  (1.549 m)   Wt 159 lb 12.8 oz (72.5 kg)   BMI 30.19 kg/m   Body mass index is 30.19 kg/m.  Advanced Directives 12/06/2018 04/23/2015  Does Patient Have a Medical Advance Directive? Yes Yes  Type of Estate agentAdvance Directive Healthcare Power of [place];Living will Healthcare Power of [place];Living will  Does patient want to make changes to medical advance directive? - No - Patient declined  Copy of Healthcare Power of Attorney in Chart? No - copy requested No - copy requested    Tobacco Social History   Tobacco Use  Smoking Status Never Smoker  Smokeless Tobacco Never Used     Counseling given: Not Answered   Clinical Intake:  Pre-visit preparation completed: Yes  Pain : No/denies pain Pain Score: 0-No pain     Nutritional Status: BMI > 30  Obese Nutritional Risks: None Diabetes: Yes CBG done?: No Did pt. bring in CBG monitor from home?: No  How often do you need to have someone help you when you read instructions, pamphlets, or other written materials from your doctor or pharmacy?: 1 - Never What is the last grade level you completed in school?: master's degree  Interpreter Needed?: No  Information entered by :: NAllen LPN  Past Medical History:  Diagnosis Date  . Arthritis   . Chronic kidney disease   . Diabetes mellitus without complication (HCC)   . Hypercholesteremia   . Hypertension   . 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 Vertigo, benign paroxysmal    Past Surgical History:  Procedure Laterality  Date  . ABDOMINAL HYSTERECTOMY    . TONSILLECTOMY  1984   Family History  Problem Relation Age of Onset  . Diabetes Mother   . Hyperlipidemia Mother   . Hypertension Mother   . Varicose Veins Mother   . Hyperlipidemia Brother   . Hypertension Brother    Social History   Socioeconomic History  . Marital status: Married    Spouse name: Not on file  . Number of children: Not on file  . Years of education: Not on file  . Highest education level: Not on file  Occupational History  . Occupation: retired  Engineer, productionocial Needs  . Financial resource strain: Not hard at all  . Food insecurity    Worry: Never true    Inability: Never true  . Transportation needs    Medical: No    Non-medical: No  Tobacco Use  . Smoking status: Never Smoker  . Smokeless tobacco: Never Used  Substance and Sexual Activity  . Alcohol use: No  . Drug use: No  . Sexual activity: Yes    Birth control/protection: Post-menopausal  Lifestyle  . Physical activity    Days per week: 0 days    Minutes per session: 0 min  . Stress: Not at all  Relationships  . Social Musicianconnections    Talks on phone: Not on file    Gets together: Not on file    Attends religious service: Not on file  Active member of club or organization: Not on file    Attends meetings of clubs or organizations: Not on file    Relationship status: Not on file  Other Topics Concern  . Not on file  Social History Narrative  . Not on file    Outpatient Encounter Medications as of 12/06/2018  Medication Sig  . carvedilol (COREG) 6.25 MG tablet TAKE 2 TABLETS BY MOUTH TWICE DAILY WITH FOOD  . Cholecalciferol (VITAMIN D3) 50 MCG (2000 UT) capsule Take 2,000 Units by mouth daily.  Marland Kitchen JANUVIA 100 MG tablet Take 100 mg by mouth 3 (three) times a week.   . meclizine (ANTIVERT) 25 MG tablet Take 1 tablet (25 mg total) by mouth 3 (three) times daily as needed for dizziness. (Patient not taking: Reported on 07/24/2018)  . telmisartan-hydrochlorothiazide  (MICARDIS HCT) 80-25 MG tablet Take 1 tablet by mouth daily.  . traMADol (ULTRAM) 50 MG tablet as needed.   No facility-administered encounter medications on file as of 12/06/2018.     Activities of Daily Living In your present state of health, do you have any difficulty performing the following activities: 12/06/2018  Hearing? N  Vision? N  Difficulty concentrating or making decisions? N  Dressing or bathing? N  Doing errands, shopping? N  Preparing Food and eating ? N  Using the Toilet? N  In the past six months, have you accidently leaked urine? N  Do you have problems with loss of bowel control? N  Managing your Medications? N  Managing your Finances? N  Housekeeping or managing your Housekeeping? N  Some recent data might be hidden    Patient Care Team: Glendale Chard, MD as PCP - General (Internal Medicine)    Assessment:   This is a routine wellness examination for [name].  Exercise Activities and Dietary recommendations Current Exercise Habits: The patient does not participate in regular exercise at present  Goals    . Patient Stated     Wants to get back to exercising       Fall Risk Fall Risk  12/06/2018 07/24/2018  Falls in the past year? 0 0  Risk for fall due to : Medication side effect -  Follow up Education provided;Falls prevention discussed -   Is the patient's home free of loose throw rugs in walkways, pet beds, electrical cords, etc?   yes      Grab bars in the bathroom? no      Handrails on the stairs?   yes      Adequate lighting?   yes  Timed Get Up and Go performed: n/a  Depression Screen PHQ 2/9 Scores 12/06/2018 07/24/2018  PHQ - 2 Score 0 0  PHQ- 9 Score 0 -     Cognitive Function     6CIT Screen 12/06/2018  What Year? 0 points  What month? 0 points  What time? 0 points  Count back from 20 0 points  Months in reverse 0 points  Repeat phrase 0 points  Total Score 0    Immunization History  Administered Date(s) Administered  .  Influenza, High Dose Seasonal PF 07/24/2018  . Pneumococcal Conjugate-13 10/07/2014    Qualifies for Shingles Vaccine? yes  Screening Tests Health Maintenance  Topic Date Due  . OPHTHALMOLOGY EXAM  09/07/1955  . PNA vac Low Risk Adult (1 of 2 - PCV13) 09/07/2012  . INFLUENZA VACCINE  01/20/2019  . HEMOGLOBIN A1C  06/07/2019  . FOOT EXAM  12/06/2019  . MAMMOGRAM  11/30/2020  .  TETANUS/TDAP  05/04/2022  . COLONOSCOPY  04/14/2028  . DEXA SCAN  Completed  . Hepatitis C Screening  Completed    Cancer Screenings: Lung: Low Dose CT Chest recommended if Age 9-80 years, 30 pack-year currently smoking OR have quit w/in 15years. Patient does not qualify. Breast:  Up to date on Mammogram? Yes   Up to date of Bone Density/Dexa? Yes Colorectal: up to date  Additional Screenings: : Hepatitis C Screening: ordered     Plan:    Wants to get back to exercise.   I have personally reviewed and noted the following in the patient's chart:   . Medical and social history . Use of alcohol, tobacco or illicit drugs  . Current medications and supplements . Functional ability and status . Nutritional status . Physical activity . Advanced directives . List of other physicians . Hospitalizations, surgeries, and ER visits in previous 12 months . Vitals . Screenings to include cognitive, depression, and falls . Referrals and appointments  In addition, I have reviewed and discussed with patient certain preventive protocols, quality metrics, and best practice recommendations. A written personalized care plan for preventive services as well as general preventive health recommendations were provided to patient.     Barb Merinoickeah E Davon Abdelaziz, LPN  [id number]/17/2020

## 2018-12-06 NOTE — Patient Instructions (Signed)
Betty Arroyo , Thank you for taking time to come for your Medicare Wellness Visit. I appreciate your ongoing commitment to your health goals. Please review the following plan we discussed and let me know if I can assist you in the future.   Screening recommendations/referrals: Colonoscopy: 03/2018 Mammogram: 11/2018 Bone Density: 02/2017 Recommended yearly ophthalmology/optometry visit for glaucoma screening and checkup Recommended yearly dental visit for hygiene and checkup  Vaccinations: Influenza vaccine: 07/2018 Pneumococcal vaccine: 09/2014 Tdap vaccine: 04/2012 Shingles vaccine: discussed    Advanced directives: Please bring a copy of your POA (Power of [place]) and/or Living Will to your next appointment.   Conditions/risks identified: obesity  Next appointment: 03/01/2019 at 11:15   Preventive Care 32 Years and Older, Female Preventive care refers to lifestyle choices and visits with your health care provider that can promote health and wellness. What does preventive care include?  A yearly physical exam. This is also called an annual well check.  Dental exams once or twice a year.  Routine eye exams. Ask your health care provider how often you should have your eyes checked.  Personal lifestyle choices, including:  Daily care of your teeth and gums.  Regular physical activity.  Eating a healthy diet.  Avoiding tobacco and drug use.  Limiting alcohol use.  Practicing safe sex.  Taking low-dose aspirin every day.  Taking vitamin and mineral supplements as recommended by your health care provider. What happens during an annual well check? The services and screenings done by your health care provider during your annual well check will depend on your age, overall health, lifestyle risk factors, and family history of disease. Counseling  Your health care provider may ask you questions about your:  Alcohol use.  Tobacco use.  Drug use.  Emotional well-being.   Home and relationship well-being.  Sexual activity.  Eating habits.  History of falls.  Memory and ability to understand (cognition).  Work and work Statistician.  Reproductive health. Screening  You may have the following tests or measurements:  Height, weight, and BMI.  Blood pressure.  Lipid and cholesterol levels. These may be checked every 5 years, or more frequently if you are over 87 years old.  Skin check.  Lung cancer screening. You may have this screening every year starting at age 59 if you have a 30-pack-year history of smoking and currently smoke or have quit within the past 15 years.  Fecal occult blood test (FOBT) of the stool. You may have this test every year starting at age 64.  Flexible sigmoidoscopy or colonoscopy. You may have a sigmoidoscopy every 5 years or a colonoscopy every 10 years starting at age 43.  Hepatitis C blood test.  Hepatitis B blood test.  Sexually transmitted disease (STD) testing.  Diabetes screening. This is done by checking your blood sugar (glucose) after you have not eaten for a while (fasting). You may have this done every 1-3 years.  Bone density scan. This is done to screen for osteoporosis. You may have this done starting at age 20.  Mammogram. This may be done every 1-2 years. Talk to your health care provider about how often you should have regular mammograms. Talk with your health care provider about your test results, treatment options, and if necessary, the need for more tests. Vaccines  Your health care provider may recommend certain vaccines, such as:  Influenza vaccine. This is recommended every year.  Tetanus, diphtheria, and acellular pertussis (Tdap, Td) vaccine. You may need a Td booster every  10 years.  Zoster vaccine. You may need this after age 70.  Pneumococcal 13-valent conjugate (PCV13) vaccine. One dose is recommended after age 67.  Pneumococcal polysaccharide (PPSV23) vaccine. One dose is  recommended after age 6. Talk to your health care provider about which screenings and vaccines you need and how often you need them. This information is not intended to replace advice given to you by your health care provider. Make sure you discuss any questions you have with your health care provider. Document Released: 07/04/2015 Document Revised: 02/25/2016 Document Reviewed: 04/08/2015 Elsevier Interactive Patient Education  2017 [place] Prevention in the Home Falls can cause injuries. They can happen to people of all ages. There are many things you can do to make your home safe and to help prevent falls. What can I do on the outside of my home?  Regularly fix the edges of walkways and driveways and fix any cracks.  Remove anything that might make you trip as you walk through a door, such as a raised step or threshold.  Trim any bushes or trees on the path to your home.  Use bright outdoor lighting.  Clear any walking paths of anything that might make someone trip, such as rocks or tools.  Regularly check to see if handrails are loose or broken. Make sure that both sides of any steps have handrails.  Any raised decks and porches should have guardrails on the edges.  Have any leaves, snow, or ice cleared regularly.  Use sand or salt on walking paths during winter.  Clean up any spills in your garage right away. This includes oil or grease spills. What can I do in the bathroom?  Use night lights.  Install grab bars by the toilet and in the tub and shower. Do not use towel bars as grab bars.  Use non-skid mats or decals in the tub or shower.  If you need to sit down in the shower, use a plastic, non-slip stool.  Keep the floor dry. Clean up any water that spills on the floor as soon as it happens.  Remove soap buildup in the tub or shower regularly.  Attach bath mats securely with double-sided non-slip rug tape.  Do not have throw rugs and other things on  the floor that can make you trip. What can I do in the bedroom?  Use night lights.  Make sure that you have a light by your bed that is easy to reach.  Do not use any sheets or blankets that are too big for your bed. They should not hang down onto the floor.  Have a firm chair that has side arms. You can use this for support while you get dressed.  Do not have throw rugs and other things on the floor that can make you trip. What can I do in the kitchen?  Clean up any spills right away.  Avoid walking on wet floors.  Keep items that you use a lot in easy-to-reach places.  If you need to reach something above you, use a strong step stool that has a grab bar.  Keep electrical cords out of the way.  Do not use floor polish or wax that makes floors slippery. If you must use wax, use non-skid floor wax.  Do not have throw rugs and other things on the floor that can make you trip. What can I do with my stairs?  Do not leave any items on the stairs.  Make sure  that there are handrails on both sides of the stairs and use them. Fix handrails that are broken or loose. Make sure that handrails are as long as the stairways.  Check any carpeting to make sure that it is firmly attached to the stairs. Fix any carpet that is loose or worn.  Avoid having throw rugs at the top or bottom of the stairs. If you do have throw rugs, attach them to the floor with carpet tape.  Make sure that you have a light switch at the top of the stairs and the bottom of the stairs. If you do not have them, ask someone to add them for you. What else can I do to help prevent falls?  Wear shoes that:  Do not have high heels.  Have rubber bottoms.  Are comfortable and fit you well.  Are closed at the toe. Do not wear sandals.  If you use a stepladder:  Make sure that it is fully opened. Do not climb a closed stepladder.  Make sure that both sides of the stepladder are locked into place.  Ask someone to  hold it for you, if possible.  Clearly mark and make sure that you can see:  Any grab bars or handrails.  First and last steps.  Where the edge of each step is.  Use tools that help you move around (mobility aids) if they are needed. These include:  Canes.  Walkers.  Scooters.  Crutches.  Turn on the lights when you go into a dark area. Replace any light bulbs as soon as they burn out.  Set up your furniture so you have a clear path. Avoid moving your furniture around.  If any of your floors are uneven, fix them.  If there are any pets around you, be aware of where they are.  Review your medicines with your doctor. Some medicines can make you feel dizzy. This can increase your chance of falling. Ask your doctor what other things that you can do to help prevent falls. This information is not intended to replace advice given to you by your health care provider. Make sure you discuss any questions you have with your health care provider. Document Released: 04/03/2009 Document Revised: 11/13/2015 Document Reviewed: 07/12/2014 Elsevier Interactive Patient Education  2017 [employer].

## 2018-12-06 NOTE — Patient Instructions (Signed)

## 2018-12-07 LAB — CMP14+EGFR
ALT: 15 IU/L (ref 0–32)
AST: 16 IU/L (ref 0–40)
Albumin/Globulin Ratio: 1.9 (ref 1.2–2.2)
Albumin: 4.7 g/dL (ref 3.7–4.7)
Alkaline Phosphatase: 109 IU/L (ref 39–117)
BUN/Creatinine Ratio: 16 (ref 12–28)
BUN: 17 mg/dL (ref 8–27)
Bilirubin Total: 0.7 mg/dL (ref 0.0–1.2)
CO2: 26 mmol/L (ref 20–29)
Calcium: 10.2 mg/dL (ref 8.7–10.3)
Chloride: 99 mmol/L (ref 96–106)
Creatinine, Ser: 1.08 mg/dL — ABNORMAL HIGH (ref 0.57–1.00)
GFR calc Af Amer: 60 mL/min/{1.73_m2} (ref >59)
GFR calc non Af Amer: 52 mL/min/{1.73_m2} — ABNORMAL LOW (ref >59)
Globulin, Total: 2.5 g/dL (ref 1.5–4.5)
Glucose: 105 mg/dL — ABNORMAL HIGH (ref 65–99)
Potassium: 4 mmol/L (ref 3.5–5.2)
Sodium: 142 mmol/L (ref 134–144)
Total Protein: 7.2 g/dL (ref 6.0–8.5)

## 2018-12-07 LAB — CBC
Hematocrit: 45.6 % (ref 34.0–46.6)
Hemoglobin: 14.7 g/dL (ref 11.1–15.9)
MCH: 28.5 pg (ref 26.6–33.0)
MCHC: 32.2 g/dL (ref 31.5–35.7)
MCV: 89 fL (ref 79–97)
Platelets: 251 10*3/uL (ref 150–450)
RBC: 5.15 x10E6/uL (ref 3.77–5.28)
RDW: 13.9 % (ref 11.7–15.4)
WBC: 5.4 10*3/uL (ref 3.4–10.8)

## 2018-12-07 LAB — LIPID PANEL
Chol/HDL Ratio: 4.8 ratio — ABNORMAL HIGH (ref 0.0–4.4)
Cholesterol, Total: 219 mg/dL — ABNORMAL HIGH (ref 100–199)
HDL: 46 mg/dL (ref >39)
LDL Calculated: 151 mg/dL — ABNORMAL HIGH (ref 0–99)
Triglycerides: 108 mg/dL (ref 0–149)
VLDL Cholesterol Cal: 22 mg/dL (ref 5–40)

## 2018-12-07 LAB — HEMOGLOBIN A1C
Est. average glucose Bld gHb Est-mCnc: 137 mg/dL
Hgb A1c MFr Bld: 6.4 % — ABNORMAL HIGH (ref 4.8–5.6)

## 2018-12-08 ENCOUNTER — Telehealth: Payer: Self-pay

## 2018-12-08 NOTE — Telephone Encounter (Signed)
Left the patient a message to call back for lab results. 

## 2018-12-08 NOTE — Telephone Encounter (Signed)
-----   Message from Glendale Chard, MD sent at 12/08/2018  8:59 AM EDT ----- Blood count is nl. Liver and kidney fxn are stable. Your LDL, bad chol is 151. Ideally, this should be less than 70 as a diabetic. I know that you have not tolerated crestor taken daily. Are you willing to take once weekly - so we can decrease plaque burden in arteries - but without causing you to have bad muscle aches?

## 2018-12-09 NOTE — Progress Notes (Signed)
Subjective:     Patient ID: Betty Arroyo , female    DOB: Jul 19, 1945 , 73 y.o.   MRN: [id number]   Chief Complaint  Patient presents with  . Annual Exam  . Diabetes  . Hypertension    HPI  She is here today for a full physical examination.  She denies having any specific concerns at this time.   Diabetes She presents for her follow-up diabetic visit. She has type 2 diabetes mellitus. Her disease course has been stable. There are no hypoglycemic associated symptoms. Pertinent negatives for diabetes include no blurred vision, no chest pain and no fatigue. There are no hypoglycemic complications. Diabetic complications include nephropathy. Risk factors for coronary artery disease include diabetes mellitus, dyslipidemia, hypertension and post-menopausal. Current diabetic treatment includes oral agent (monotherapy). Her weight is stable. She is following a diabetic diet. She participates in exercise intermittently. Her breakfast blood glucose is taken between 8-9 am. Her breakfast blood glucose range is generally 90-110 mg/dl. Her dinner blood glucose is taken between 5-6 pm. Her dinner blood glucose range is generally 130-140 mg/dl. An ACE inhibitor/angiotensin II receptor blocker is being taken. Eye exam is current.  Hypertension This is a chronic problem. The current episode started more than 1 year ago. The problem has been gradually improving since onset. The problem is controlled. Pertinent negatives include no blurred vision or chest pain. Past treatments include ACE inhibitors, diuretics and angiotensin blockers.     Past Medical History:  Diagnosis Date  . Arthritis   . Chronic kidney disease   . Diabetes mellitus without complication ([place])   . Hypercholesteremia   . Hypertension   . 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 Vertigo, benign paroxysmal      Family History  Problem Relation Age of Onset  . Diabetes Mother   . Hyperlipidemia Mother   . Hypertension Mother    . Varicose Veins Mother   . Hyperlipidemia Brother   . Hypertension Brother      Current Outpatient Medications:  .  carvedilol (COREG) 6.25 MG tablet, TAKE 2 TABLETS BY MOUTH TWICE DAILY WITH FOOD, Disp: 120 tablet, Rfl: 0 .  Cholecalciferol (VITAMIN D3) 50 MCG (2000 UT) capsule, Take 2,000 Units by mouth daily., Disp: , Rfl:  .  JANUVIA 100 MG tablet, Take 100 mg by mouth 3 (three) times a week. , Disp: , Rfl:  .  meclizine (ANTIVERT) 25 MG tablet, Take 1 tablet (25 mg total) by mouth 3 (three) times daily as needed for dizziness. (Patient not taking: Reported on 07/24/2018), Disp: 30 tablet, Rfl: 0 .  telmisartan-hydrochlorothiazide (MICARDIS HCT) 80-25 MG tablet, Take 1 tablet by mouth daily., Disp: , Rfl:  .  traMADol (ULTRAM) 50 MG tablet, as needed., Disp: , Rfl: 0   No Known Allergies    The patient states she uses post menopausal status for birth control. Last LMP was No LMP recorded. Patient has had a hysterectomy.. Negative for Dysmenorrhea _0 @. Negative for: breast discharge, breast lump(s), breast pain and breast self exam. Associated symptoms include abnormal vaginal bleeding. Pertinent negatives include abnormal bleeding (hematology), anxiety, decreased libido, depression, difficulty falling sleep, dyspareunia, history of infertility, nocturia, sexual dysfunction, sleep disturbances, urinary incontinence, urinary urgency, vaginal discharge and vaginal itching. Diet regular.The patient states her exercise level is  intermittent.  . The patient's tobacco use is:  Social History   Tobacco Use  Smoking Status Never Smoker  Smokeless Tobacco Never Used  . She has  been exposed to passive smoke. The patient's alcohol use is:  Social History   Substance and Sexual Activity  Alcohol Use No  .   Review of Systems  Constitutional: Negative.  Negative for fatigue.  HENT: Negative.   Eyes: Negative.  Negative for blurred vision.  Respiratory: Negative.    Cardiovascular: Negative.  Negative for chest pain.  Gastrointestinal: Negative.   Endocrine: Negative.   Genitourinary: Negative.   Musculoskeletal: Negative.   Skin: Negative.   Allergic/Immunologic: Negative.   Neurological: Negative.   Psychiatric/Behavioral: Negative.      Today's Vitals   12/06/18 1053  BP: (!) 142/78  Pulse: 76  Temp: 98 F (36.7 C)  TempSrc: Oral  Weight: 159 lb 12.8 oz (72.5 kg)  Height: 5' 1" (1.549 m)  PainSc: 0-No pain   Body mass index is 30.19 kg/m.   Objective:  Physical Exam Vitals signs and nursing note reviewed.  Constitutional:      Appearance: Normal appearance. She is obese.  HENT:     Head: Normocephalic and atraumatic.     Right Ear: Tympanic membrane, ear canal and external ear normal.     Left Ear: Tympanic membrane, ear canal and external ear normal.     Nose: Nose normal.     Mouth/Throat:     Mouth: Mucous membranes are moist.     Pharynx: Oropharynx is clear.  Eyes:     Extraocular Movements: Extraocular movements intact.     Conjunctiva/sclera: Conjunctivae normal.     Pupils: Pupils are equal, round, and reactive to light.  Neck:     Musculoskeletal: Normal range of motion and neck supple.  Cardiovascular:     Rate and Rhythm: Normal rate and regular rhythm.     Pulses: Normal pulses.          Dorsalis pedis pulses are 2+ on the right side and 2+ on the left side.     Heart sounds: Normal heart sounds.  Pulmonary:     Effort: Pulmonary effort is normal.     Breath sounds: Normal breath sounds.  Chest:     Breasts: Tanner Score is 5.        Right: Normal. No swelling, bleeding, inverted nipple, mass, nipple discharge or skin change.        Left: Normal. No swelling, bleeding, inverted nipple, mass, nipple discharge or skin change.  Abdominal:     General: Abdomen is flat. Bowel sounds are normal.     Palpations: Abdomen is soft.  Genitourinary:    Comments: deferred Musculoskeletal: Normal range of motion.      Right foot: Bunion present.     Left foot: Bunion present.  Feet:     Right foot:     Protective Sensation: 5 sites tested. 5 sites sensed.     Skin integrity: Skin integrity normal.     Toenail Condition: Right toenails are normal.     Left foot:     Protective Sensation: 5 sites tested. 5 sites sensed.     Skin integrity: Skin integrity normal.     Toenail Condition: Left toenails are normal.  Skin:    General: Skin is warm and dry.  Neurological:     General: No focal deficit present.     Mental Status: She is alert and oriented to person, place, and time.  Psychiatric:        Mood and Affect: Mood normal.        Behavior: Behavior normal.           Assessment And Plan:     1. Routine general medical examination at health care facility  A full exam was performed. Importance of monthly self breast exams was discussed with the patient.  PATIENT HAS BEEN ADVISED TO GET 30-45 MINUTES REGULAR EXERCISE NO LESS THAN FOUR TO FIVE DAYS PER WEEK - BOTH WEIGHTBEARING EXERCISES AND AEROBIC ARE RECOMMENDED.  SHE IS ADVISED TO FOLLOW A HEALTHY DIET WITH AT LEAST SIX FRUITS/VEGGIES PER DAY, DECREASE INTAKE OF RED MEAT, AND TO INCREASE FISH INTAKE TO TWO DAYS PER WEEK.  MEATS/FISH SHOULD NOT BE FRIED, BAKED OR BROILED IS PREFERABLE.  I SUGGEST WEARING SPF 50 SUNSCREEN ON EXPOSED PARTS AND ESPECIALLY WHEN IN THE DIRECT SUNLIGHT FOR AN EXTENDED PERIOD OF TIME.  PLEASE AVOID FAST FOOD RESTAURANTS AND INCREASE YOUR WATER INTAKE.   2. Type 2 diabetes mellitus with stage 3 chronic kidney disease, without long-term current use of insulin ([facility])  Diabetic foot exam was performed.  I DISCUSSED WITH THE PATIENT AT LENGTH REGARDING THE GOALS OF GLYCEMIC CONTROL AND POSSIBLE LONG-TERM COMPLICATIONS.  I  ALSO STRESSED THE IMPORTANCE OF COMPLIANCE WITH HOME GLUCOSE MONITORING, DIETARY RESTRICTIONS INCLUDING AVOIDANCE OF SUGARY DRINKS/PROCESSED FOODS,  ALONG WITH REGULAR EXERCISE.  I  ALSO STRESSED THE  IMPORTANCE OF ANNUAL EYE EXAMS, SELF FOOT CARE AND COMPLIANCE WITH OFFICE VISITS.  - CMP14+EGFR - CBC - Lipid panel - Hemoglobin A1c  3. Parenchymal renal hypertension, stage 1 through stage 4 or unspecified chronic kidney disease  Fair control. She will continue with current meds for now. She is encouraged to avoid adding salt to her foods.  I will re-evaluate at her next visit. She is aware optimal bp is less than 120/80.  She is encouraged to avoid packaged foods as well which tend to be high in sodium. EKG performed, no new changes noted.   - EKG 12-Lead        Robyn N Sanders, MD    THE PATIENT IS ENCOURAGED TO PRACTICE SOCIAL DISTANCING DUE TO THE COVID-19 PANDEMIC.   

## 2018-12-11 LAB — HM DIABETES EYE EXAM

## 2018-12-14 ENCOUNTER — Other Ambulatory Visit: Payer: Self-pay | Admitting: Internal Medicine

## 2018-12-15 ENCOUNTER — Other Ambulatory Visit: Payer: Self-pay | Admitting: Internal Medicine

## 2018-12-18 ENCOUNTER — Encounter: Payer: Self-pay | Admitting: Internal Medicine

## 2018-12-19 ENCOUNTER — Telehealth: Payer: Self-pay

## 2018-12-19 NOTE — Telephone Encounter (Signed)
The pt called and wanted to check to see if she is going to start the Crestor once weekly.

## 2018-12-19 NOTE — Telephone Encounter (Signed)
Yes. She can start now. Okay to send in refill

## 2018-12-21 ENCOUNTER — Other Ambulatory Visit: Payer: Self-pay | Admitting: Orthopedic Surgery

## 2018-12-27 ENCOUNTER — Other Ambulatory Visit: Payer: Self-pay

## 2018-12-27 MED ORDER — ROSUVASTATIN CALCIUM 40 MG PO TABS: ORAL_TABLET | ORAL | 1 refills | Status: DC

## 2018-12-27 NOTE — Telephone Encounter (Signed)
The pt was notified that her once weekly crestor has been faxed to her pharmacy.

## 2018-12-28 ENCOUNTER — Telehealth: Payer: Self-pay

## 2018-12-28 NOTE — Telephone Encounter (Signed)
The pt called and said that she is already taking Crestor weekly and that she has Rosuvastatin that she takes once a week already.  The pt said she didn't know that Crestor was the same thing.  I told the pt that it wasn't active because she has said that she didn't take it.  The pt said that she wasn't familiar and that she now knows because both names are on the recent prescription that she got from the pharmacy.  I told the pt that I would let Dr. Baird Cancer know.    Notes recorded by Michelle Nasuti, CMA on 12/08/2018 at 11:18 AM EDT  The pt said yes she is willing to take crestor once weekly.  ------   Notes recorded by Glendale Chard, MD on 12/08/2018 at 8:59 AM EDT  a1c is 6.5 which is great!  ------   Notes recorded by Glendale Chard, MD on 12/08/2018 at 8:59 AM EDT  Blood count is nl. Liver and kidney fxn are stable. Your LDL, bad chol is 151. Ideally, this should be less than 70 as a diabetic. I know that you have not tolerated crestor taken daily. Are you willing to take once weekly - so we can decrease plaque burden in arteries - but without causing you to have bad muscle aches?

## 2018-12-30 IMAGING — US US RENAL
1 series · 14 of 25 positions shown · non-contrast
Comparison: None.

CLINICAL DATA: 72-year-old female with chronic kidney disease and
acute kidney insufficiency. Proteinuria. Diabetes and hypertension.
Initial encounter.

EXAM:
RENAL / URINARY TRACT ULTRASOUND COMPLETE

[Series 1: us renal · 0.20mm/px · 14 of 38 slices shown]
[im 1/38]
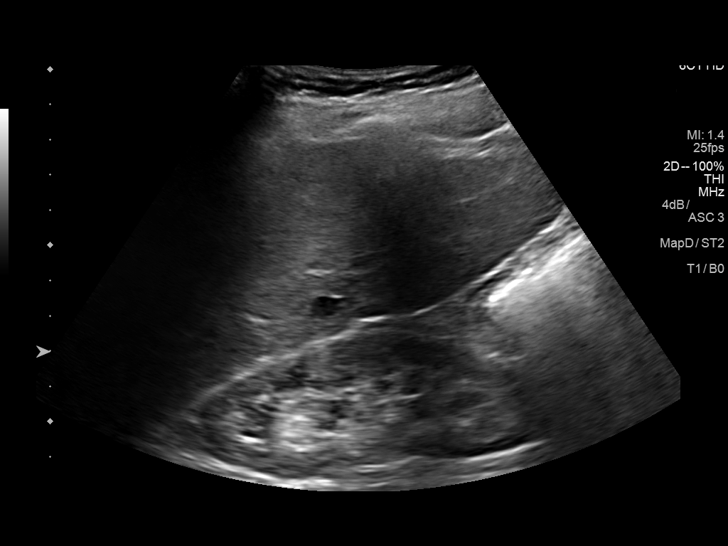
[im 4/38]
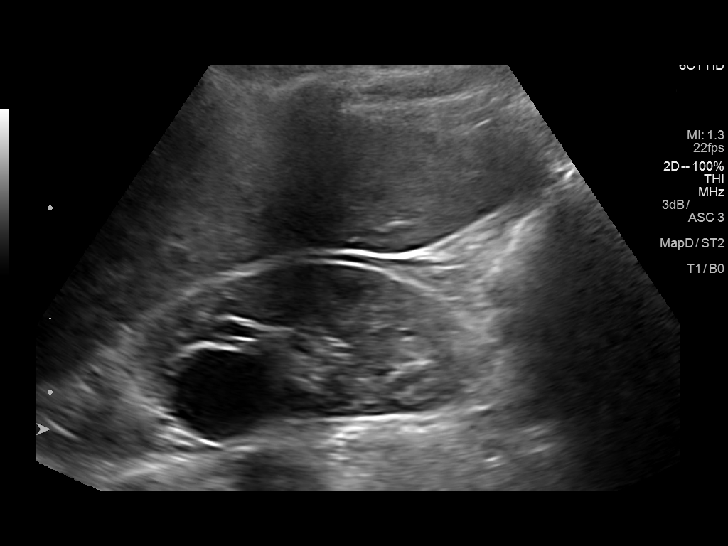
[im 7/38]
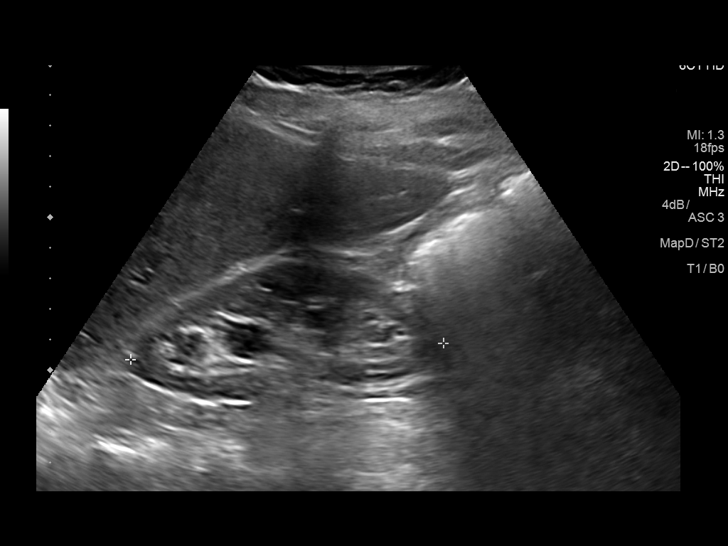
[im 10/38]
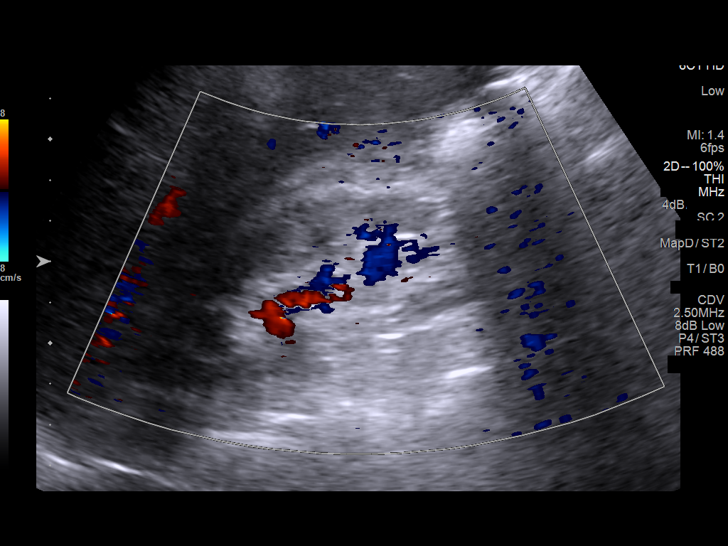
[im 13/38]
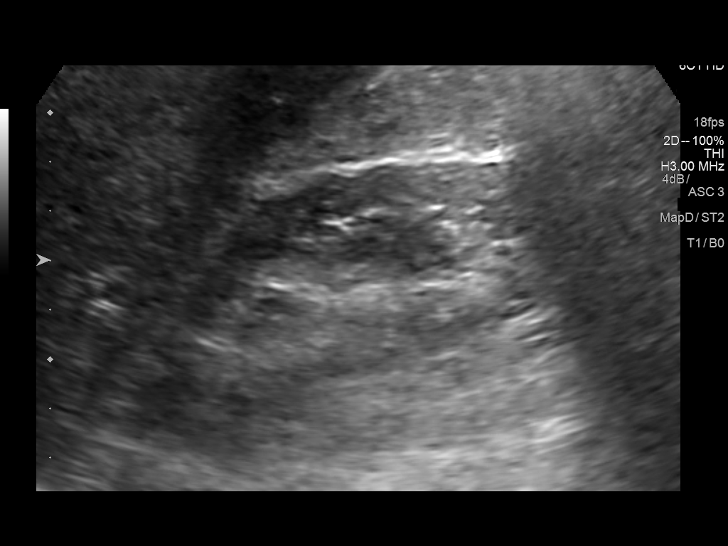
[im 14/38]
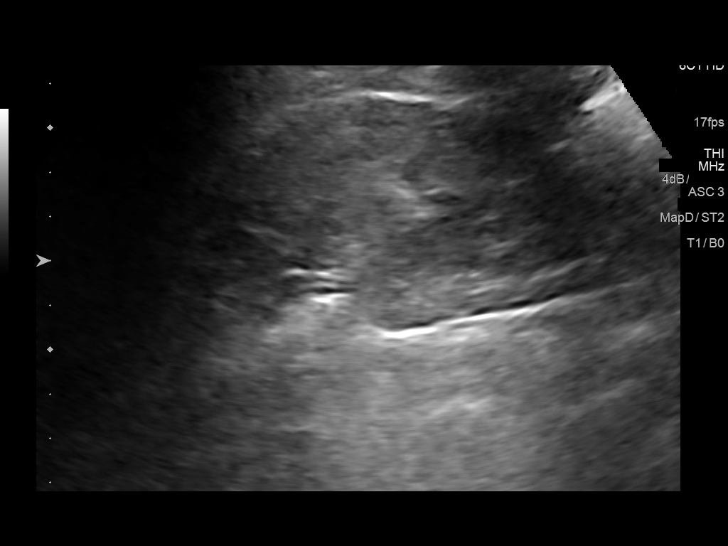
[im 17/38]
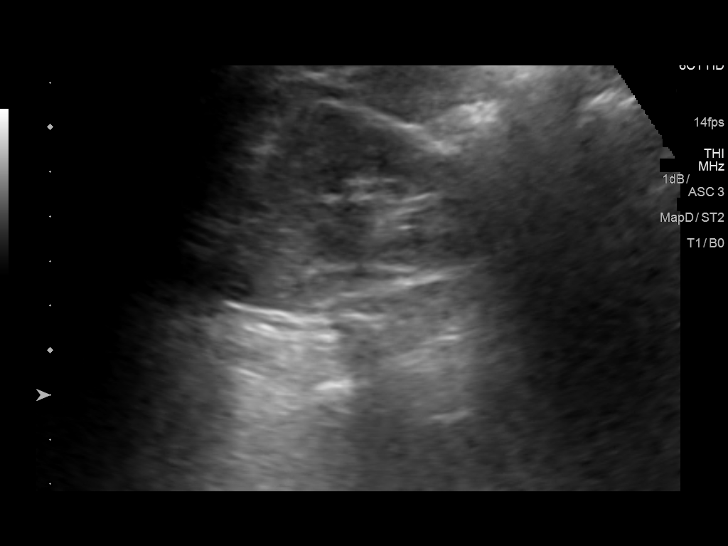
[im 21/38]
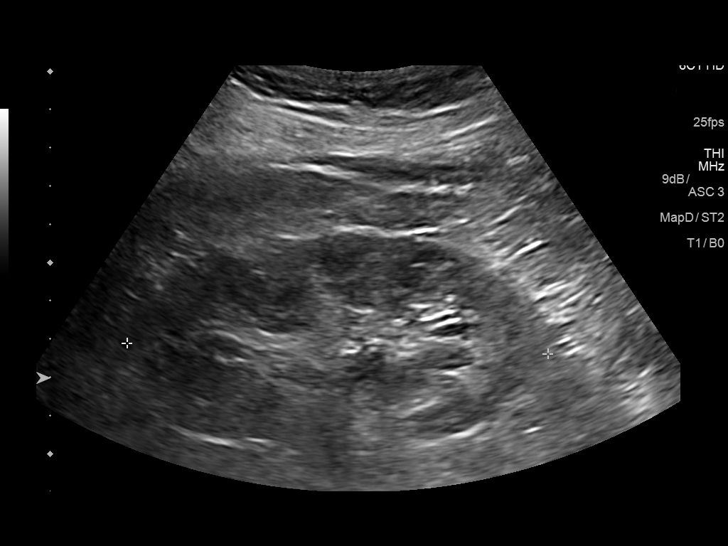
[im 24/38]
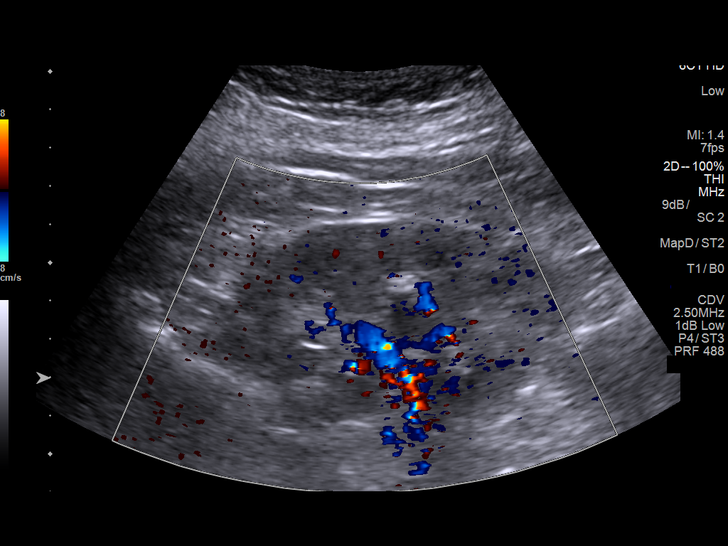
[im 25/38]
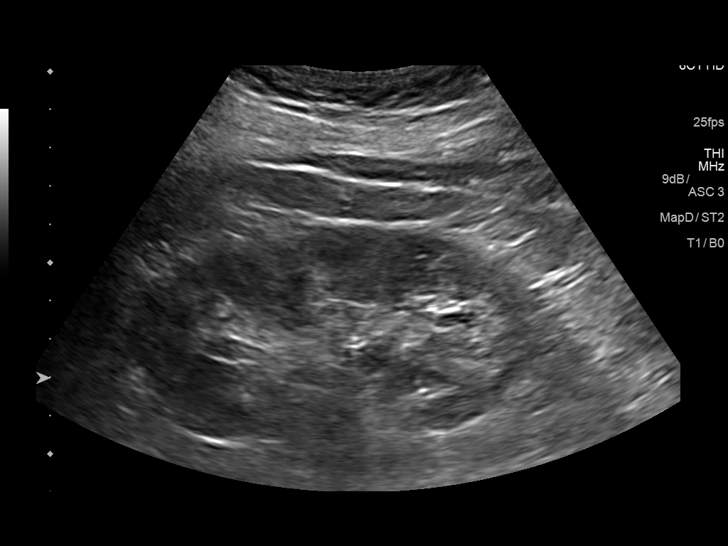
[im 28/38]
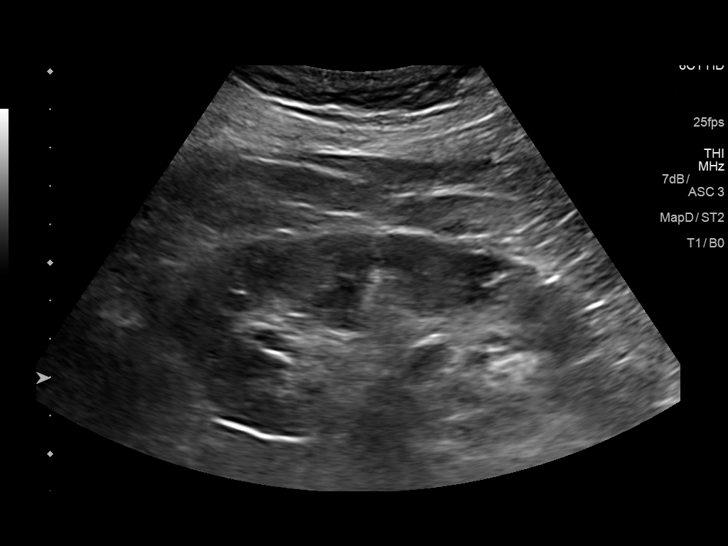
[im 31/38]
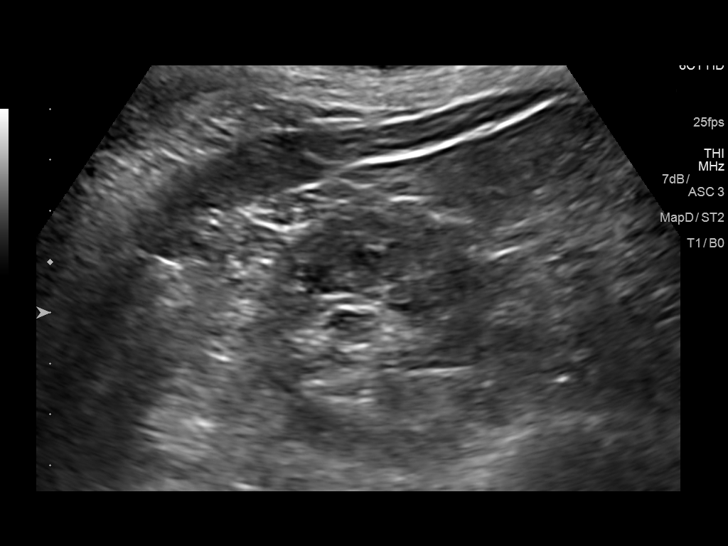
[im 34/38]
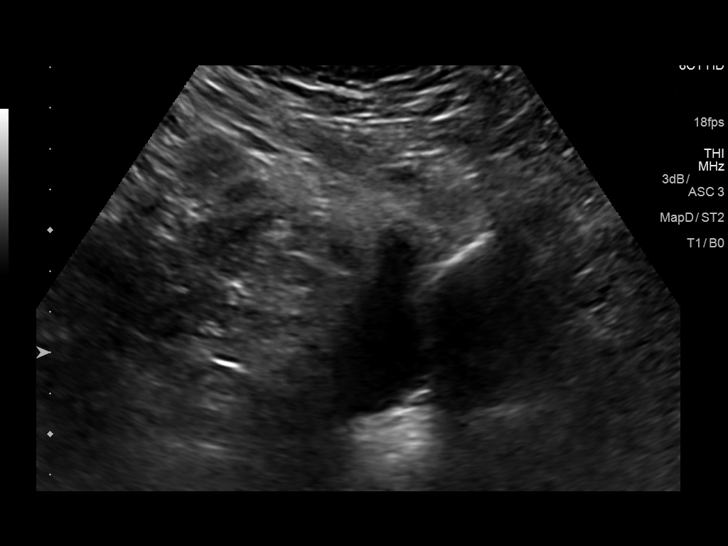
[im 38/38]
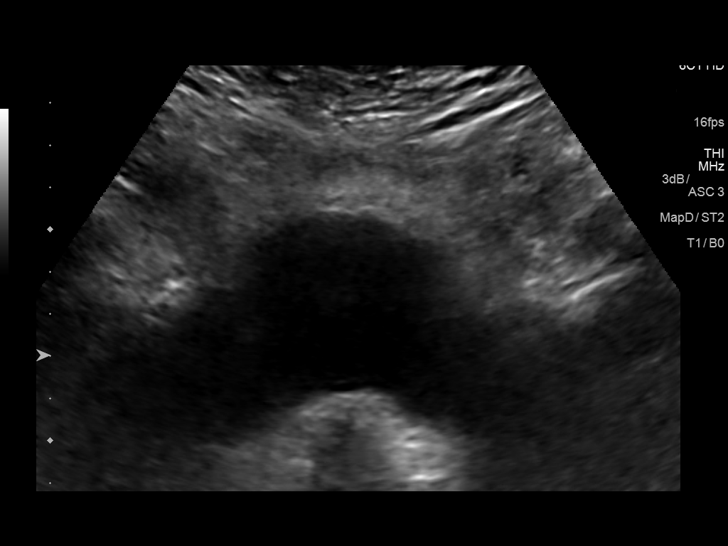

[14 of 25 positions shown; findings below may reference images not displayed]

FINDINGS: Right Kidney:

Length: 10.2 cm.. Increased echogenicity. No hydronephrosis. Upper
pole 2.9 x 2.5 x 2.3 cm cyst.

Left Kidney:

Length: 11.1 cm.. Increased echogenicity. No hydronephrosis or mass.

Bladder:

Partially decompressed.. Appears normal for degree of bladder
distention.
IMPRESSION: Increased renal parenchyma echogenicity consistent with changes of
medical renal disease.

No hydronephrosis.

Right upper pole 2.9 cm cyst.

## 2019-01-02 ENCOUNTER — Other Ambulatory Visit: Payer: Self-pay | Admitting: Internal Medicine

## 2019-01-23 ENCOUNTER — Telehealth: Payer: Self-pay

## 2019-01-23 NOTE — Telephone Encounter (Signed)
The pt was asked if she has ever had a stress test before and the pt said no.  The pt was asked because the pt needs surgical clearance for left total knee arthroscopy.

## 2019-01-24 ENCOUNTER — Other Ambulatory Visit: Payer: Self-pay

## 2019-01-24 ENCOUNTER — Telehealth: Payer: Self-pay

## 2019-01-24 DIAGNOSIS — Z01818 Encounter for other preprocedural examination: Secondary | ICD-10-CM

## 2019-01-24 NOTE — Telephone Encounter (Signed)
The patient was notified that Dr. Baird Cancer is referring the pt for a stress test because the pt needs surgical clearance for total left knee arthroscopy.

## 2019-01-25 ENCOUNTER — Other Ambulatory Visit: Payer: Self-pay

## 2019-01-25 ENCOUNTER — Ambulatory Visit (INDEPENDENT_AMBULATORY_CARE_PROVIDER_SITE_OTHER): Payer: Medicare Other | Admitting: Nurse Practitioner

## 2019-01-25 ENCOUNTER — Encounter: Payer: Self-pay | Admitting: Nurse Practitioner

## 2019-01-25 VITALS — BP 132/80 | HR 68 | Temp 98.3°F | Ht 60.6 in | Wt 161.8 lb

## 2019-01-25 DIAGNOSIS — Z01818 Encounter for other preprocedural examination: Secondary | ICD-10-CM | POA: Diagnosis not present

## 2019-01-25 DIAGNOSIS — G8929 Other chronic pain: Secondary | ICD-10-CM

## 2019-01-25 DIAGNOSIS — E119 Type 2 diabetes mellitus without complications: Secondary | ICD-10-CM

## 2019-01-25 DIAGNOSIS — M25562 Pain in left knee: Secondary | ICD-10-CM

## 2019-01-25 NOTE — Progress Notes (Signed)
Subjective:     Patient ID: Betty Arroyo , female    DOB: 11-Oct-1945 , 73 y.o.   MRN: [id number]   Chief Complaint  Patient presents with  . SURGICAL CLEARANCE    patient states she is supposed to have total knee surgery on the 28th of this month    HPI  She is here for surgical clearance of her left knee.  Scheduled for Aug 28th.  Was having pain to her knee which she says she is no longer having.  Denies having any falls. Last blood sugar was 120.  She is able to walk without difficulty.  She was having severe pain at night.    Diabetes She presents for her follow-up diabetic visit. She has type 2 diabetes mellitus. Pertinent negatives for hypoglycemia include no dizziness or headaches.     Past Medical History:  Diagnosis Date  . Arthritis   . Chronic kidney disease   . Diabetes mellitus without complication ([place])   . Hypercholesteremia   . Hypertension   . 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 Vertigo, benign paroxysmal      Family History  Problem Relation Age of Onset  . Diabetes Mother   . Hyperlipidemia Mother   . Hypertension Mother   . Varicose Veins Mother   . Hyperlipidemia Brother   . Hypertension Brother      Current Outpatient Medications:  .  carvedilol (COREG) 6.25 MG tablet, TAKE 2 TABLETS BY MOUTH TWICE DAILY WITH FOOD, Disp: 120 tablet, Rfl: 1 .  Cholecalciferol (VITAMIN D3) 50 MCG (2000 UT) capsule, Take 2,000 Units by mouth daily., Disp: , Rfl:  .  JANUVIA 100 MG tablet, Take 100 mg by mouth 3 (three) times a week. , Disp: , Rfl:  .  meclizine (ANTIVERT) 25 MG tablet, Take 1 tablet (25 mg total) by mouth 3 (three) times daily as needed for dizziness. (Patient taking differently: Take 25 mg by mouth as needed for dizziness. ), Disp: 30 tablet, Rfl: 0 .  rosuvastatin (CRESTOR) 40 MG tablet, Take 1 tablet by mouth once daily, Disp: 90 tablet, Rfl: 0 .  traMADol (ULTRAM) 50 MG tablet, as needed., Disp: , Rfl: 0   No Known Allergies    Review of Systems  Constitutional: Negative.   Respiratory: Negative.   Cardiovascular: Negative.   Musculoskeletal: Positive for joint swelling.       No current pain  Neurological: Negative.  Negative for dizziness and headaches.     Today's Vitals   01/25/19 1459  BP: 132/80  Pulse: 68  Temp: 98.3 F (36.8 C)  TempSrc: Oral  Weight: 161 lb 12.8 oz (73.4 kg)  Height: 5' 0.6" (1.539 m)  PainSc: 0-No pain   Body mass index is 30.98 kg/m.   Objective:  Physical Exam Vitals signs reviewed.  Constitutional:      Appearance: Normal appearance.  Cardiovascular:     Rate and Rhythm: Normal rate and regular rhythm.     Pulses: Normal pulses.     Heart sounds: Normal heart sounds. No murmur.  Pulmonary:     Effort: Pulmonary effort is normal.     Breath sounds: Normal breath sounds.  Musculoskeletal: Normal range of motion.        General: No swelling or tenderness.     Comments: Negative drawer test  Skin:    General: Skin is warm and dry.     Capillary Refill: Capillary refill takes less than 2 seconds.  Neurological:     General: No focal deficit present.     Mental Status: She is alert and oriented to person, place, and time.  Psychiatric:        Mood and Affect: Mood normal.        Behavior: Behavior normal.        Thought Content: Thought content normal.        Judgment: Judgment normal.         Assessment And Plan:     1. Pre-operative clearance  She is cleared medically for knee surgery if she is still interested  She is planning to see the cardiologist for a stress test  2. Chronic pain of left knee  Reports her knee is better today  She is planning to have surgery of her left knee   Arnette FeltsJanece Tidus Upchurch, FNP    THE PATIENT IS ENCOURAGED TO PRACTICE SOCIAL DISTANCING DUE TO THE COVID-19 PANDEMIC.

## 2019-02-09 ENCOUNTER — Encounter (HOSPITAL_COMMUNITY): Payer: Self-pay

## 2019-02-09 NOTE — Patient Instructions (Addendum)
DUE TO COVID-19 ONLY ONE VISITOR IS ALLOWED TO COME WITH YOU AND STAY IN THE WAITING ROOM ONLY DURING PRE OP AND PROCEDURE. THE ONE VISITOR MAY VISIT WITH YOU IN YOUR PRIVATE ROOM DURING VISITING HOURS ONLY!!   COVID SWAB TESTING MUST BE COMPLETED ON: Today, Immediately following pre op appointment. [address], [place] [facility] pre surgical testing line (Must self quarantine after testing. Follow instructions on handout.)             Your procedure is scheduled on: Friday, Aug. 28, 2020   [facility]    Report to admitting at 7:00 AM   Call this number if you have problems the morning of surgery [phone]   Do not eat food:After Midnight.   May have liquids until 6:30AM day of surgery   CLEAR LIQUID DIET  Foods Allowed                                                                     Foods Excluded  Water, Black Coffee and tea, regular and decaf                             liquids that you cannot  Plain Jell-O in any flavor  (No red)                                           see through such as: Fruit ices (not with fruit pulp)                                     milk, soups, orange juice  Iced Popsicles (No red)                                    All solid food Carbonated beverages, regular and diet                                    Apple juices Sports drinks like Gatorade (No red) Lightly seasoned clear broth or consume(fat free) Sugar, honey syrup  Sample Menu Breakfast                                Lunch                                     Supper Cranberry juice                    Beef broth                            Chicken broth Jell-O  Grape juice                           Apple juice Coffee or tea                        Jell-O                                      Popsicle                                                Coffee or tea                         Coffee or tea   Complete one G2 drink the morning of surgery at 6:30AM the day of surgery.   Brush your teeth the morning of surgery.   Do NOT smoke after Midnight   Take these medicines the morning of surgery with A SIP OF WATER: Carvedilol, Rosuvastatin  DO NOT TAKE ANY DIABETIC MEDICATIONS DAY OF YOUR SURGERY                               You may not have any metal on your body including hair pins, jewelry, and body piercings             Do not wear make-up, lotions, powders, perfumes/cologne, or deodorant             Do not wear nail polish.  Do not shave  48 hours prior to surgery.               Do not bring valuables to the hospital. [facility] IS NOT             RESPONSIBLE   FOR VALUABLES.   Contacts, dentures or bridgework may not be worn into surgery.   Bring small overnight bag day of surgery.    Special Instructions: Bring a copy of your healthcare power of attorney and living will documents         the day of surgery if you haven't scanned them in before.              Please read over the following fact sheets you were given:  How to Manage Your Diabetes Before and After Surgery  Why is it important to control my blood sugar before and after surgery? . Improving blood sugar levels before and after surgery helps healing and can limit problems. . A way of improving blood sugar control is eating a healthy diet by: o  Eating less sugar and carbohydrates o  Increasing activity/exercise o  Talking with your doctor about reaching your blood sugar goals . High blood sugars (greater than 180 mg/dL) can raise your risk of infections and slow your recovery, so you will need to focus on controlling your diabetes during the weeks before surgery. . Make sure that the doctor who takes care of your diabetes knows about your planned surgery including the date and location.  How do I manage my blood sugar before surgery? . Check your blood sugar at least 4 times a day,  starting 2  days before surgery, to make sure that the level is not too high or low. o Check your blood sugar the morning of your surgery when you wake up and every 2 hours until you get to the Short Stay unit. . If your blood sugar is less than 70 mg/dL, you will need to treat for low blood sugar: o Do not take insulin. o Treat a low blood sugar (less than 70 mg/dL) with  cup of clear juice (cranberry or apple), 4 glucose tablets, OR glucose gel. o Recheck blood sugar in 15 minutes after treatment (to make sure it is greater than 70 mg/dL). If your blood sugar is not greater than 70 mg/dL on recheck, call [phone] for further instructions. . Report your blood sugar to the short stay nurse when you get to Short Stay.  . If you are admitted to the hospital after surgery: o Your blood sugar will be checked by the staff and you will probably be given insulin after surgery (instead of oral diabetes medicines) to make sure you have good blood sugar levels. o The goal for blood sugar control after surgery is 80-180 mg/dL.  [facility] - Preparing for Surgery Before surgery, you can play an important role.  Because skin is not sterile, your skin needs to be as free of germs as possible.  You can reduce the number of germs on your skin by washing with CHG (chlorahexidine gluconate) soap before surgery.  CHG is an antiseptic cleaner which kills germs and bonds with the skin to continue killing germs even after washing. Please DO NOT use if you have an allergy to CHG or antibacterial soaps.  If your skin becomes reddened/irritated stop using the CHG and inform your nurse when you arrive at Short Stay. Do not shave (including legs and underarms) for at least 48 hours prior to the first CHG shower.  You may shave your face/neck.  Please follow these instructions carefully:  1.  Shower with CHG Soap the night before surgery and the  morning of surgery.  2.  If you choose to wash your hair, wash your hair first as  usual with your normal  shampoo.  3.  After you shampoo, rinse your hair and body thoroughly to remove the shampoo.                             4.  Use CHG as you would any other liquid soap.  You can apply chg directly to the skin and wash.  Gently with a scrungie or clean washcloth.  5.  Apply the CHG Soap to your body ONLY FROM THE NECK DOWN.   Do   not use on face/ open                           Wound or open sores. Avoid contact with eyes, ears mouth and   genitals (private parts).                       Wash face,  Genitals (private parts) with your normal soap.             6.  Wash thoroughly, paying special attention to the area where your    surgery  will be performed.  7.  Thoroughly rinse your body with warm water from the neck down.  8.  DO NOT  shower/wash with your normal soap after using and rinsing off the CHG Soap.                9.  Pat yourself dry with a clean towel.            10.  Wear clean pajamas.            11.  Place clean sheets on your bed the night of your first shower and do not  sleep with pets. Day of Surgery : Do not apply any lotions/deodorants the morning of surgery.  Please wear clean clothes to the hospital/surgery center.  FAILURE TO FOLLOW THESE INSTRUCTIONS MAY RESULT IN THE CANCELLATION OF YOUR SURGERY  PATIENT SIGNATURE_________________________________  NURSE SIGNATURE__________________________________  ________________________________________________________________________   Rogelia MireIncentive Spirometer  An incentive spirometer is a tool that can help keep your lungs clear and active. This tool measures how well you are filling your lungs with each breath. Taking long deep breaths may help reverse or decrease the chance of developing breathing (pulmonary) problems (especially infection) following:  A long period of time when you are unable to move or be active. BEFORE THE PROCEDURE   If the spirometer includes an indicator to show your best effort,  your nurse or respiratory therapist will set it to a desired goal.  If possible, sit up straight or lean slightly forward. Try not to slouch.  Hold the incentive spirometer in an upright position. INSTRUCTIONS FOR USE  1. Sit on the edge of your bed if possible, or sit up as far as you can in bed or on a chair. 2. Hold the incentive spirometer in an upright position. 3. Breathe out normally. 4. Place the mouthpiece in your mouth and seal your lips tightly around it. 5. Breathe in slowly and as deeply as possible, raising the piston or the ball toward the top of the column. 6. Hold your breath for 3-5 seconds or for as long as possible. Allow the piston or ball to fall to the bottom of the column. 7. Remove the mouthpiece from your mouth and breathe out normally. 8. Rest for a few seconds and repeat Steps 1 through 7 at least 10 times every 1-2 hours when you are awake. Take your time and take a few normal breaths between deep breaths. 9. The spirometer may include an indicator to show your best effort. Use the indicator as a goal to work toward during each repetition. 10. After each set of 10 deep breaths, practice coughing to be sure your lungs are clear. If you have an incision (the cut made at the time of surgery), support your incision when coughing by placing a pillow or rolled up towels firmly against it. Once you are able to get out of bed, walk around indoors and cough well. You may stop using the incentive spirometer when instructed by your caregiver.  RISKS AND COMPLICATIONS  Take your time so you do not get dizzy or light-headed.  If you are in pain, you may need to take or ask for pain medication before doing incentive spirometry. It is harder to take a deep breath if you are having pain. AFTER USE  Rest and breathe slowly and easily.  It can be helpful to keep track of a log of your progress. Your caregiver can provide you with a simple table to help with this. If you are  using the spirometer at home, follow these instructions: SEEK MEDICAL CARE IF:   You are having difficultly  using the spirometer.  You have trouble using the spirometer as often as instructed.  Your pain medication is not giving enough relief while using the spirometer.  You develop fever of 100.5 F (38.1 C) or higher. SEEK IMMEDIATE MEDICAL CARE IF:   You cough up bloody sputum that had not been present before.  You develop fever of 102 F (38.9 C) or greater.  You develop worsening pain at or near the incision site. MAKE SURE YOU:   Understand these instructions.  Will watch your condition.  Will get help right away if you are not doing well or get worse. Document Released: 10/18/2006 Document Revised: 08/30/2011 Document Reviewed: 12/19/2006 [facility] Patient Information 2014 [place], [place].   ________________________________________________________________________

## 2019-02-13 ENCOUNTER — Encounter (HOSPITAL_COMMUNITY)
Admission: RE | Admit: 2019-02-13 | Discharge: 2019-02-13 | Disposition: A | Discharge status: Home / Self Care | Payer: Medicare Other | Source: Ambulatory Visit | Attending: Orthopedic Surgery | Admitting: Orthopedic Surgery

## 2019-02-13 ENCOUNTER — Other Ambulatory Visit (HOSPITAL_COMMUNITY)
Admission: RE | Admit: 2019-02-13 | Discharge: 2019-02-13 | Disposition: A | Discharge status: Home / Self Care | Payer: Medicare Other | Source: Ambulatory Visit | Attending: Orthopedic Surgery | Admitting: Orthopedic Surgery

## 2019-02-13 ENCOUNTER — Ambulatory Visit (HOSPITAL_COMMUNITY)
Admission: RE | Admit: 2019-02-13 | Discharge: 2019-02-13 | Disposition: A | Discharge status: Home / Self Care | Payer: Medicare Other | Source: Ambulatory Visit | Attending: Orthopedic Surgery | Admitting: Orthopedic Surgery

## 2019-02-13 ENCOUNTER — Encounter (HOSPITAL_COMMUNITY): Payer: Self-pay

## 2019-02-13 ENCOUNTER — Other Ambulatory Visit: Payer: Self-pay

## 2019-02-13 DIAGNOSIS — M1712 Unilateral primary osteoarthritis, left knee: Secondary | ICD-10-CM | POA: Diagnosis not present

## 2019-02-13 DIAGNOSIS — E1122 Type 2 diabetes mellitus with diabetic chronic kidney disease: Secondary | ICD-10-CM | POA: Diagnosis not present

## 2019-02-13 DIAGNOSIS — N183 Chronic kidney disease, stage 3 (moderate): Secondary | ICD-10-CM | POA: Diagnosis not present

## 2019-02-13 DIAGNOSIS — Z01818 Encounter for other preprocedural examination: Secondary | ICD-10-CM | POA: Insufficient documentation

## 2019-02-13 DIAGNOSIS — E7800 Pure hypercholesterolemia, unspecified: Secondary | ICD-10-CM | POA: Insufficient documentation

## 2019-02-13 DIAGNOSIS — Z7984 Long term (current) use of oral hypoglycemic drugs: Secondary | ICD-10-CM | POA: Insufficient documentation

## 2019-02-13 DIAGNOSIS — Z20828 Contact with and (suspected) exposure to other viral communicable diseases: Secondary | ICD-10-CM | POA: Insufficient documentation

## 2019-02-13 DIAGNOSIS — Z79899 Other long term (current) drug therapy: Secondary | ICD-10-CM | POA: Insufficient documentation

## 2019-02-13 DIAGNOSIS — Z01811 Encounter for preprocedural respiratory examination: Secondary | ICD-10-CM

## 2019-02-13 DIAGNOSIS — I129 Hypertensive chronic kidney disease with stage 1 through stage 4 chronic kidney disease, or unspecified chronic kidney disease: Secondary | ICD-10-CM | POA: Diagnosis not present

## 2019-02-13 LAB — CBC WITH DIFFERENTIAL/PLATELET
Abs Immature Granulocytes: 0.01 10*3/uL (ref 0.00–0.07)
Basophils Absolute: 0 10*3/uL (ref 0.0–0.1)
Basophils Relative: 0 %
Eosinophils Absolute: 0.1 10*3/uL (ref 0.0–0.5)
Eosinophils Relative: 2 %
HCT: 44.2 % (ref 36.0–46.0)
Hemoglobin: 14.1 g/dL (ref 12.0–15.0)
Immature Granulocytes: 0 %
Lymphocytes Relative: 22 %
Lymphs Abs: 1.3 10*3/uL (ref 0.7–4.0)
MCH: 28.5 pg (ref 26.0–34.0)
MCHC: 31.9 g/dL (ref 30.0–36.0)
MCV: 89.3 fL (ref 80.0–100.0)
Monocytes Absolute: 0.6 10*3/uL (ref 0.1–1.0)
Monocytes Relative: 11 %
Neutro Abs: 3.7 10*3/uL (ref 1.7–7.7)
Neutrophils Relative %: 65 %
Platelets: 224 10*3/uL (ref 150–400)
RBC: 4.95 MIL/uL (ref 3.87–5.11)
RDW: 14.3 % (ref 11.5–15.5)
WBC: 5.8 10*3/uL (ref 4.0–10.5)
nRBC: 0 % (ref 0.0–0.2)

## 2019-02-13 LAB — SARS CORONAVIRUS 2 (TAT 6-24 HRS): SARS Coronavirus 2: NEGATIVE

## 2019-02-13 LAB — COMPREHENSIVE METABOLIC PANEL
ALT: 16 U/L (ref 0–44)
AST: 15 U/L (ref 15–41)
Albumin: 4.1 g/dL (ref 3.5–5.0)
Alkaline Phosphatase: 93 U/L (ref 38–126)
Anion gap: 8 (ref 5–15)
BUN: 22 mg/dL (ref 8–23)
CO2: 28 mmol/L (ref 22–32)
Calcium: 9.5 mg/dL (ref 8.9–10.3)
Chloride: 107 mmol/L (ref 98–111)
Creatinine, Ser: 1.16 mg/dL — ABNORMAL HIGH (ref 0.44–1.00)
GFR calc Af Amer: 55 mL/min — ABNORMAL LOW (ref >60)
GFR calc non Af Amer: 47 mL/min — ABNORMAL LOW (ref >60)
Glucose, Bld: 97 mg/dL (ref 70–99)
Potassium: 3.7 mmol/L (ref 3.5–5.1)
Sodium: 143 mmol/L (ref 135–145)
Total Bilirubin: 0.8 mg/dL (ref 0.3–1.2)
Total Protein: 7.7 g/dL (ref 6.5–8.1)

## 2019-02-13 LAB — APTT: aPTT: 26 seconds (ref 24–36)

## 2019-02-13 LAB — URINALYSIS, ROUTINE W REFLEX MICROSCOPIC
Bilirubin Urine: NEGATIVE
Glucose, UA: NEGATIVE mg/dL
Hgb urine dipstick: NEGATIVE
Ketones, ur: NEGATIVE mg/dL
Leukocytes,Ua: NEGATIVE
Nitrite: NEGATIVE
Protein, ur: 100 mg/dL — AB
Specific Gravity, Urine: 1.023 (ref 1.005–1.030)
pH: 5 (ref 5.0–8.0)

## 2019-02-13 LAB — SURGICAL PCR SCREEN
MRSA, PCR: NEGATIVE
Staphylococcus aureus: NEGATIVE

## 2019-02-13 LAB — GLUCOSE, CAPILLARY: Glucose-Capillary: 94 mg/dL (ref 70–99)

## 2019-02-13 LAB — PROTIME-INR
INR: 0.9 (ref 0.8–1.2)
Prothrombin Time: 12.2 seconds (ref 11.4–15.2)

## 2019-02-13 NOTE — Progress Notes (Signed)
SPOKE W/  Yeslin     SCREENING SYMPTOMS OF COVID 19:   COUGH--NO  RUNNY NOSE--- NO  SORE THROAT---NO  NASAL CONGESTION----NO  SNEEZING----NO  SHORTNESS OF BREATH---NO  DIFFICULTY BREATHING---NO  TEMP >100.0 -----NO  UNEXPLAINED BODY ACHES------NO  CHILLS -------- NO  HEADACHES ---------NO  LOSS OF SMELL/ TASTE --------NO    HAVE YOU OR ANY FAMILY MEMBER TRAVELLED PAST 14 DAYS OUT OF THE   COUNTY---NO STATE----NO COUNTRY----NO  HAVE YOU OR ANY FAMILY MEMBER BEEN EXPOSED TO ANYONE WITH COVID 19? NO    

## 2019-02-15 MED ORDER — BUPIVACAINE LIPOSOME 1.3 % IJ SUSP
20.0000 mL | Freq: Once | INTRAMUSCULAR | Status: DC
  Filled 2019-02-15: qty 20

## 2019-02-16 ENCOUNTER — Encounter (HOSPITAL_COMMUNITY)
Admission: RE | Disposition: A | Discharge status: Home Health | Payer: Self-pay | Source: Home / Self Care | Attending: Orthopedic Surgery

## 2019-02-16 ENCOUNTER — Ambulatory Visit (HOSPITAL_COMMUNITY): Payer: Medicare Other | Admitting: Anesthesiology

## 2019-02-16 ENCOUNTER — Other Ambulatory Visit: Payer: Self-pay

## 2019-02-16 ENCOUNTER — Ambulatory Visit (HOSPITAL_COMMUNITY): Payer: Medicare Other | Admitting: Physician Assistant

## 2019-02-16 ENCOUNTER — Encounter (HOSPITAL_COMMUNITY): Payer: Self-pay | Admitting: *Deleted

## 2019-02-16 ENCOUNTER — Ambulatory Visit (HOSPITAL_COMMUNITY)
Admission: RE | Admit: 2019-02-16 | Discharge: 2019-02-17 | Disposition: A | Discharge status: Home Health | Payer: Medicare Other | Attending: Orthopedic Surgery | Admitting: Orthopedic Surgery

## 2019-02-16 DIAGNOSIS — E7800 Pure hypercholesterolemia, unspecified: Secondary | ICD-10-CM | POA: Insufficient documentation

## 2019-02-16 DIAGNOSIS — E669 Obesity, unspecified: Secondary | ICD-10-CM | POA: Insufficient documentation

## 2019-02-16 DIAGNOSIS — Z8249 Family history of ischemic heart disease and other diseases of the circulatory system: Secondary | ICD-10-CM | POA: Insufficient documentation

## 2019-02-16 DIAGNOSIS — I129 Hypertensive chronic kidney disease with stage 1 through stage 4 chronic kidney disease, or unspecified chronic kidney disease: Secondary | ICD-10-CM | POA: Insufficient documentation

## 2019-02-16 DIAGNOSIS — Z833 Family history of diabetes mellitus: Secondary | ICD-10-CM | POA: Diagnosis not present

## 2019-02-16 DIAGNOSIS — I8393 Asymptomatic varicose veins of bilateral lower extremities: Secondary | ICD-10-CM | POA: Diagnosis not present

## 2019-02-16 DIAGNOSIS — M1712 Unilateral primary osteoarthritis, left knee: Secondary | ICD-10-CM | POA: Diagnosis present

## 2019-02-16 DIAGNOSIS — M170 Bilateral primary osteoarthritis of knee: Secondary | ICD-10-CM | POA: Insufficient documentation

## 2019-02-16 DIAGNOSIS — H8110 Benign paroxysmal vertigo, unspecified ear: Secondary | ICD-10-CM | POA: Diagnosis not present

## 2019-02-16 DIAGNOSIS — N183 Chronic kidney disease, stage 3 (moderate): Secondary | ICD-10-CM | POA: Diagnosis not present

## 2019-02-16 DIAGNOSIS — Z6830 Body mass index (BMI) 30.0-30.9, adult: Secondary | ICD-10-CM | POA: Diagnosis not present

## 2019-02-16 DIAGNOSIS — Z7984 Long term (current) use of oral hypoglycemic drugs: Secondary | ICD-10-CM | POA: Insufficient documentation

## 2019-02-16 DIAGNOSIS — Z90710 Acquired absence of both cervix and uterus: Secondary | ICD-10-CM | POA: Diagnosis not present

## 2019-02-16 DIAGNOSIS — E1122 Type 2 diabetes mellitus with diabetic chronic kidney disease: Secondary | ICD-10-CM | POA: Insufficient documentation

## 2019-02-16 DIAGNOSIS — Z79899 Other long term (current) drug therapy: Secondary | ICD-10-CM | POA: Insufficient documentation

## 2019-02-16 HISTORY — PX: TOTAL KNEE ARTHROPLASTY: SHX125

## 2019-02-16 LAB — GLUCOSE, CAPILLARY
Glucose-Capillary: 149 mg/dL — ABNORMAL HIGH (ref 70–99)
Glucose-Capillary: 124 mg/dL — ABNORMAL HIGH (ref 70–99)
Glucose-Capillary: 160 mg/dL — ABNORMAL HIGH (ref 70–99)
Glucose-Capillary: 149 mg/dL — ABNORMAL HIGH (ref 70–99)

## 2019-02-16 SURGERY — ARTHROPLASTY, KNEE, TOTAL
Anesthesia: Spinal | Site: Knee | Laterality: Left

## 2019-02-16 MED ORDER — DEXAMETHASONE SODIUM PHOSPHATE 10 MG/ML IJ SOLN
10.0000 mg | Freq: Two times a day (BID) | INTRAMUSCULAR | Status: DC
  Administered 2019-02-17: 10 mg via INTRAVENOUS
  Filled 2019-02-16: qty 1

## 2019-02-16 MED ORDER — METHOCARBAMOL 500 MG PO TABS
500.0000 mg | ORAL_TABLET | Freq: Four times a day (QID) | ORAL | Status: DC | PRN
  Administered 2019-02-16 (×2): 500 mg via ORAL
  Filled 2019-02-16 (×2): qty 1

## 2019-02-16 MED ORDER — ONDANSETRON HCL 4 MG PO TABS: 4.0000 mg | ORAL_TABLET | Freq: Four times a day (QID) | ORAL | Status: DC | PRN

## 2019-02-16 MED ORDER — ACETAMINOPHEN 160 MG/5ML PO SOLN: 325.0000 mg | Freq: Once | ORAL | Status: DC | PRN

## 2019-02-16 MED ORDER — ACETAMINOPHEN 10 MG/ML IV SOLN: 1000.0000 mg | Freq: Once | INTRAVENOUS | Status: DC | PRN

## 2019-02-16 MED ORDER — ACETAMINOPHEN 325 MG PO TABS: 325.0000 mg | ORAL_TABLET | Freq: Once | ORAL | Status: DC | PRN

## 2019-02-16 MED ORDER — CEFAZOLIN SODIUM-DEXTROSE 2-4 GM/100ML-% IV SOLN
2.0000 g | INTRAVENOUS | Status: AC
  Administered 2019-02-16: 09:00:00 2 g via INTRAVENOUS
  Filled 2019-02-16: qty 100

## 2019-02-16 MED ORDER — POLYETHYLENE GLYCOL 3350 17 G PO PACK: 17.0000 g | PACK | Freq: Every day | ORAL | Status: DC | PRN

## 2019-02-16 MED ORDER — OXYCODONE-ACETAMINOPHEN 5-325 MG PO TABS: 1.0000 | ORAL_TABLET | Freq: Four times a day (QID) | ORAL | 0 refills | Status: DC | PRN

## 2019-02-16 MED ORDER — DIPHENHYDRAMINE HCL 12.5 MG/5ML PO ELIX: 12.5000 mg | ORAL_SOLUTION | ORAL | Status: DC | PRN

## 2019-02-16 MED ORDER — MIDAZOLAM HCL 2 MG/2ML IJ SOLN
1.0000 mg | Freq: Once | INTRAMUSCULAR | Status: DC
  Filled 2019-02-16: qty 2

## 2019-02-16 MED ORDER — ONDANSETRON HCL 4 MG/2ML IJ SOLN
INTRAMUSCULAR | Status: AC
  Filled 2019-02-16: qty 2

## 2019-02-16 MED ORDER — TRANEXAMIC ACID-NACL 1000-0.7 MG/100ML-% IV SOLN
1000.0000 mg | INTRAVENOUS | Status: AC
  Administered 2019-02-16: 09:00:00 1000 mg via INTRAVENOUS
  Filled 2019-02-16: qty 100

## 2019-02-16 MED ORDER — ALUM & MAG HYDROXIDE-SIMETH 200-200-20 MG/5ML PO SUSP: 30.0000 mL | ORAL | Status: DC | PRN

## 2019-02-16 MED ORDER — ACETAMINOPHEN 325 MG PO TABS: 325.0000 mg | ORAL_TABLET | Freq: Four times a day (QID) | ORAL | Status: DC | PRN

## 2019-02-16 MED ORDER — BUPIVACAINE-EPINEPHRINE 0.5% -1:200000 IJ SOLN
INTRAMUSCULAR | Status: DC | PRN
  Administered 2019-02-16: 30 mL

## 2019-02-16 MED ORDER — PHENYLEPHRINE HCL (PRESSORS) 10 MG/ML IV SOLN
INTRAVENOUS | Status: AC
  Filled 2019-02-16: qty 1

## 2019-02-16 MED ORDER — DOCUSATE SODIUM 100 MG PO CAPS
100.0000 mg | ORAL_CAPSULE | Freq: Two times a day (BID) | ORAL | Status: DC
  Administered 2019-02-16 – 2019-02-17 (×3): 100 mg via ORAL
  Filled 2019-02-16 (×3): qty 1

## 2019-02-16 MED ORDER — ASPIRIN EC 325 MG PO TBEC: 325.0000 mg | DELAYED_RELEASE_TABLET | Freq: Two times a day (BID) | ORAL | 0 refills | Status: DC

## 2019-02-16 MED ORDER — ONDANSETRON HCL 4 MG/2ML IJ SOLN: 4.0000 mg | Freq: Four times a day (QID) | INTRAMUSCULAR | Status: DC | PRN

## 2019-02-16 MED ORDER — BISACODYL 5 MG PO TBEC: 5.0000 mg | DELAYED_RELEASE_TABLET | Freq: Every day | ORAL | Status: DC | PRN

## 2019-02-16 MED ORDER — MIDAZOLAM HCL 5 MG/5ML IJ SOLN
INTRAMUSCULAR | Status: DC | PRN
  Administered 2019-02-16: 2 mg via INTRAVENOUS

## 2019-02-16 MED ORDER — HYDROMORPHONE HCL 1 MG/ML IJ SOLN: 0.5000 mg | INTRAMUSCULAR | Status: DC | PRN

## 2019-02-16 MED ORDER — CELECOXIB 200 MG PO CAPS
200.0000 mg | ORAL_CAPSULE | Freq: Two times a day (BID) | ORAL | Status: DC
  Administered 2019-02-16 – 2019-02-17 (×2): 200 mg via ORAL
  Filled 2019-02-16 (×2): qty 1

## 2019-02-16 MED ORDER — FENTANYL CITRATE (PF) 100 MCG/2ML IJ SOLN
50.0000 ug | Freq: Once | INTRAMUSCULAR | Status: AC
  Administered 2019-02-16: 25 ug via INTRAVENOUS
  Filled 2019-02-16: qty 2

## 2019-02-16 MED ORDER — TRANEXAMIC ACID-NACL 1000-0.7 MG/100ML-% IV SOLN
1000.0000 mg | Freq: Once | INTRAVENOUS | Status: AC
  Administered 2019-02-16: 13:00:00 1000 mg via INTRAVENOUS
  Filled 2019-02-16: qty 100

## 2019-02-16 MED ORDER — DEXAMETHASONE SODIUM PHOSPHATE 10 MG/ML IJ SOLN
INTRAMUSCULAR | Status: AC
  Filled 2019-02-16: qty 1

## 2019-02-16 MED ORDER — 0.9 % SODIUM CHLORIDE (POUR BTL) OPTIME
TOPICAL | Status: DC | PRN
  Administered 2019-02-16: 09:00:00 1000 mL

## 2019-02-16 MED ORDER — GABAPENTIN 300 MG PO CAPS
300.0000 mg | ORAL_CAPSULE | Freq: Two times a day (BID) | ORAL | Status: DC
  Administered 2019-02-16 – 2019-02-17 (×3): 300 mg via ORAL
  Filled 2019-02-16 (×3): qty 1

## 2019-02-16 MED ORDER — ONDANSETRON HCL 4 MG/2ML IJ SOLN
INTRAMUSCULAR | Status: DC | PRN
  Administered 2019-02-16: 4 mg via INTRAVENOUS

## 2019-02-16 MED ORDER — SODIUM CHLORIDE 0.9 % IV SOLN
INTRAVENOUS | Status: DC | PRN
  Administered 2019-02-16: 10:00:00 25 ug/min via INTRAVENOUS

## 2019-02-16 MED ORDER — SODIUM CHLORIDE 0.9 % IV SOLN
INTRAVENOUS | Status: DC
  Administered 2019-02-16: 13:00:00 100 mL/h via INTRAVENOUS
  Administered 2019-02-17: 01:00:00 via INTRAVENOUS

## 2019-02-16 MED ORDER — OXYCODONE HCL 5 MG PO TABS
5.0000 mg | ORAL_TABLET | ORAL | Status: DC | PRN
  Administered 2019-02-16: 13:00:00 5 mg via ORAL
  Administered 2019-02-16 – 2019-02-17 (×3): 10 mg via ORAL
  Filled 2019-02-16 (×3): qty 2
  Filled 2019-02-16: qty 1

## 2019-02-16 MED ORDER — PROPOFOL 500 MG/50ML IV EMUL
INTRAVENOUS | Status: DC | PRN
  Administered 2019-02-16: 20 mg via INTRAVENOUS

## 2019-02-16 MED ORDER — PROMETHAZINE HCL 25 MG/ML IJ SOLN: 6.2500 mg | INTRAMUSCULAR | Status: DC | PRN

## 2019-02-16 MED ORDER — ASPIRIN EC 325 MG PO TBEC
325.0000 mg | DELAYED_RELEASE_TABLET | Freq: Two times a day (BID) | ORAL | Status: DC
  Administered 2019-02-17: 09:00:00 325 mg via ORAL
  Filled 2019-02-16: qty 1

## 2019-02-16 MED ORDER — BUPIVACAINE IN DEXTROSE 0.75-8.25 % IT SOLN
INTRATHECAL | Status: DC | PRN
  Administered 2019-02-16: 1.6 mL via INTRATHECAL

## 2019-02-16 MED ORDER — SODIUM CHLORIDE 0.9 % IR SOLN
Status: DC | PRN
  Administered 2019-02-16: 1000 mL

## 2019-02-16 MED ORDER — WATER FOR IRRIGATION, STERILE IR SOLN
Status: DC | PRN
  Administered 2019-02-16: 2000 mL

## 2019-02-16 MED ORDER — LACTATED RINGERS IV SOLN: INTRAVENOUS | Status: DC

## 2019-02-16 MED ORDER — BUPIVACAINE LIPOSOME 1.3 % IJ SUSP
INTRAMUSCULAR | Status: DC | PRN
  Administered 2019-02-16: 20 mL

## 2019-02-16 MED ORDER — POVIDONE-IODINE 10 % EX SWAB
2.0000 "application " | Freq: Once | CUTANEOUS | Status: AC
  Administered 2019-02-16: 2 via TOPICAL

## 2019-02-16 MED ORDER — CEFAZOLIN SODIUM-DEXTROSE 2-4 GM/100ML-% IV SOLN
2.0000 g | Freq: Four times a day (QID) | INTRAVENOUS | Status: AC
  Administered 2019-02-16 (×2): 2 g via INTRAVENOUS
  Filled 2019-02-16 (×2): qty 100

## 2019-02-16 MED ORDER — DEXAMETHASONE SODIUM PHOSPHATE 10 MG/ML IJ SOLN
INTRAMUSCULAR | Status: DC | PRN
  Administered 2019-02-16: 10 mg via INTRAVENOUS

## 2019-02-16 MED ORDER — ROPIVACAINE HCL 7.5 MG/ML IJ SOLN
INTRAMUSCULAR | Status: DC | PRN
  Administered 2019-02-16: 20 mL via PERINEURAL

## 2019-02-16 MED ORDER — CARVEDILOL 12.5 MG PO TABS
12.5000 mg | ORAL_TABLET | Freq: Two times a day (BID) | ORAL | Status: DC
  Administered 2019-02-16 – 2019-02-17 (×2): 12.5 mg via ORAL
  Filled 2019-02-16 (×2): qty 1

## 2019-02-16 MED ORDER — BUPIVACAINE-EPINEPHRINE (PF) 0.5% -1:200000 IJ SOLN
INTRAMUSCULAR | Status: AC
  Filled 2019-02-16: qty 30

## 2019-02-16 MED ORDER — SODIUM CHLORIDE 0.9% FLUSH
INTRAVENOUS | Status: DC | PRN
  Administered 2019-02-16: 50 mL

## 2019-02-16 MED ORDER — TIZANIDINE HCL 2 MG PO TABS: 2.0000 mg | ORAL_TABLET | Freq: Three times a day (TID) | ORAL | 0 refills | Status: DC | PRN

## 2019-02-16 MED ORDER — DOCUSATE SODIUM 100 MG PO CAPS: 100.0000 mg | ORAL_CAPSULE | Freq: Two times a day (BID) | ORAL | 0 refills | Status: DC

## 2019-02-16 MED ORDER — MAGNESIUM CITRATE PO SOLN: 1.0000 | Freq: Once | ORAL | Status: DC | PRN

## 2019-02-16 MED ORDER — SODIUM CHLORIDE (PF) 0.9 % IJ SOLN
INTRAMUSCULAR | Status: AC
  Filled 2019-02-16: qty 50

## 2019-02-16 MED ORDER — INSULIN ASPART 100 UNIT/ML ~~LOC~~ SOLN
0.0000 [IU] | Freq: Three times a day (TID) | SUBCUTANEOUS | Status: DC
  Administered 2019-02-16: 20:00:00 3 [IU] via SUBCUTANEOUS

## 2019-02-16 MED ORDER — PROPOFOL 10 MG/ML IV BOLUS
INTRAVENOUS | Status: AC
  Filled 2019-02-16: qty 40

## 2019-02-16 MED ORDER — CHLORHEXIDINE GLUCONATE 4 % EX LIQD: 60.0000 mL | Freq: Once | CUTANEOUS | Status: DC

## 2019-02-16 MED ORDER — LACTATED RINGERS IV SOLN
INTRAVENOUS | Status: DC
  Administered 2019-02-16 (×2): via INTRAVENOUS

## 2019-02-16 MED ORDER — MEPERIDINE HCL 50 MG/ML IJ SOLN: 6.2500 mg | INTRAMUSCULAR | Status: DC | PRN

## 2019-02-16 MED ORDER — PROPOFOL 500 MG/50ML IV EMUL
INTRAVENOUS | Status: DC | PRN
  Administered 2019-02-16: 75 ug/kg/min via INTRAVENOUS

## 2019-02-16 MED ORDER — METHOCARBAMOL 500 MG IVPB - SIMPLE MED
500.0000 mg | Freq: Four times a day (QID) | INTRAVENOUS | Status: DC | PRN
  Filled 2019-02-16: qty 50

## 2019-02-16 MED ORDER — HYDROMORPHONE HCL 1 MG/ML IJ SOLN: 0.2500 mg | INTRAMUSCULAR | Status: DC | PRN

## 2019-02-16 MED ORDER — MIDAZOLAM HCL 2 MG/2ML IJ SOLN
INTRAMUSCULAR | Status: AC
  Filled 2019-02-16: qty 2

## 2019-02-16 MED ORDER — PROPOFOL 10 MG/ML IV BOLUS
INTRAVENOUS | Status: AC
  Filled 2019-02-16: qty 20

## 2019-02-16 MED ORDER — BUPIVACAINE HCL (PF) 0.25 % IJ SOLN
INTRAMUSCULAR | Status: AC
  Filled 2019-02-16: qty 30

## 2019-02-16 SURGICAL SUPPLY — 56 items
ATTUNE PS FEM LT SZ 4 CEM KNEE (Femur) ×2 IMPLANT
ATTUNE PSRP INSR SZ4 8 KNEE (Insert) ×1 IMPLANT
ATTUNE PSRP INSR SZ4 8MM KNEE (Insert) ×1 IMPLANT
BAG ZIPLOCK 12X15 (MISCELLANEOUS) ×3 IMPLANT
BASE TIBIAL ROT PLAT SZ 5 KNEE (Knees) IMPLANT
BENZOIN TINCTURE PRP APPL 2/3 (GAUZE/BANDAGES/DRESSINGS) ×3 IMPLANT
BLADE SAGITTAL 25.0X1.19X90 (BLADE) ×2 IMPLANT
BLADE SAGITTAL 25.0X1.19X90MM (BLADE) ×1
BLADE SAW SGTL 11.0X1.19X90.0M (BLADE) ×2 IMPLANT
BLADE SAW SGTL 13.0X1.19X90.0M (BLADE) ×3 IMPLANT
BLADE SURG SZ10 CARB STEEL (BLADE) ×6 IMPLANT
BNDG ELASTIC 6X5.8 VLCR STR LF (GAUZE/BANDAGES/DRESSINGS) ×3 IMPLANT
BOOTIES KNEE HIGH SLOAN (MISCELLANEOUS) ×3 IMPLANT
BOWL SMART MIX CTS (DISPOSABLE) ×3 IMPLANT
CEMENT HV SMART SET (Cement) ×6 IMPLANT
CLOSURE WOUND 1/2 X4 (GAUZE/BANDAGES/DRESSINGS) ×1
COVER SURGICAL LIGHT HANDLE (MISCELLANEOUS) ×3 IMPLANT
COVER WAND RF STERILE (DRAPES) IMPLANT
CUFF TOURN SGL QUICK 34 (TOURNIQUET CUFF) ×2
CUFF TRNQT CYL 34X4.125X (TOURNIQUET CUFF) ×1 IMPLANT
DECANTER SPIKE VIAL GLASS SM (MISCELLANEOUS) ×4 IMPLANT
DRAPE U-SHAPE 47X51 STRL (DRAPES) ×3 IMPLANT
DRSG AQUACEL AG ADV 3.5X10 (GAUZE/BANDAGES/DRESSINGS) ×3 IMPLANT
DURAPREP 26ML APPLICATOR (WOUND CARE) ×3 IMPLANT
ELECT REM PT RETURN 15FT ADLT (MISCELLANEOUS) ×3 IMPLANT
GLOVE BIOGEL PI IND STRL 8 (GLOVE) ×2 IMPLANT
GLOVE BIOGEL PI INDICATOR 8 (GLOVE) ×4
GLOVE ECLIPSE 7.5 STRL STRAW (GLOVE) ×6 IMPLANT
GOWN STRL REUS W/TWL XL LVL3 (GOWN DISPOSABLE) ×6 IMPLANT
HANDPIECE INTERPULSE COAX TIP (DISPOSABLE) ×2
HOLDER FOLEY CATH W/STRAP (MISCELLANEOUS) IMPLANT
HOOD PEEL AWAY FLYTE STAYCOOL (MISCELLANEOUS) ×9 IMPLANT
KIT TURNOVER KIT A (KITS) IMPLANT
MANIFOLD NEPTUNE II (INSTRUMENTS) ×3 IMPLANT
NEEDLE HYPO 22GX1.5 SAFETY (NEEDLE) ×3 IMPLANT
NS IRRIG 1000ML POUR BTL (IV SOLUTION) ×3 IMPLANT
PACK ICE MAXI GEL EZY WRAP (MISCELLANEOUS) ×3 IMPLANT
PACK TOTAL KNEE CUSTOM (KITS) ×3 IMPLANT
PADDING CAST ABS 6INX4YD NS (CAST SUPPLIES) ×2
PADDING CAST ABS COTTON 6X4 NS (CAST SUPPLIES) IMPLANT
PADDING CAST COTTON 6X4 STRL (CAST SUPPLIES) ×3 IMPLANT
PATELLA MEDIAL ATTUN 35MM KNEE (Knees) ×2 IMPLANT
PIN DRILL FIX HALF THREAD (BIT) ×2 IMPLANT
PIN STEINMAN FIXATION KNEE (PIN) ×2 IMPLANT
PROTECTOR NERVE ULNAR (MISCELLANEOUS) ×3 IMPLANT
SET HNDPC FAN SPRY TIP SCT (DISPOSABLE) ×1 IMPLANT
STRIP CLOSURE SKIN 1/2X4 (GAUZE/BANDAGES/DRESSINGS) ×1 IMPLANT
SUT MNCRL AB 3-0 PS2 18 (SUTURE) ×3 IMPLANT
SUT VIC AB 0 CT1 36 (SUTURE) ×3 IMPLANT
SUT VIC AB 1 CT1 36 (SUTURE) ×6 IMPLANT
SYR CONTROL 10ML LL (SYRINGE) ×6 IMPLANT
TIBIAL BASE ROT PLAT SZ 5 KNEE (Knees) ×3 IMPLANT
TRAY FOLEY MTR SLVR 14FR STAT (SET/KITS/TRAYS/PACK) ×2 IMPLANT
WATER STERILE IRR 1000ML POUR (IV SOLUTION) ×6 IMPLANT
WRAP KNEE MAXI GEL POST OP (GAUZE/BANDAGES/DRESSINGS) ×2 IMPLANT
YANKAUER SUCT BULB TIP 10FT TU (MISCELLANEOUS) ×3 IMPLANT

## 2019-02-16 NOTE — Anesthesia Postprocedure Evaluation (Signed)
Anesthesia Post Note  Patient: Betty Arroyo  Procedure(s) Performed: TOTAL KNEE ARTHROPLASTY (Left Knee)     Patient location during evaluation: PACU Anesthesia Type: Spinal Level of consciousness: oriented and awake and alert Pain management: pain level controlled Vital Signs Assessment: post-procedure vital signs reviewed and stable Respiratory status: spontaneous breathing, respiratory function stable and patient connected to nasal cannula oxygen Cardiovascular status: blood pressure returned to baseline and stable Postop Assessment: no headache, no backache, no apparent nausea or vomiting and spinal receding Anesthetic complications: no    Last Vitals:  Vitals:   02/16/19 1130 02/16/19 1140  BP: (!) 130/57 134/66  Pulse: (!) 49 (!) 48  Resp:  15  Temp: (!) 36.4 C (!) 36.3 C  SpO2: 96% 97%    Last Pain:  Vitals:   02/16/19 1140  TempSrc: Axillary  PainSc:                  Effie Berkshire

## 2019-02-16 NOTE — Anesthesia Procedure Notes (Signed)
Spinal  Patient location during procedure: OR Start time: 02/16/2019 8:48 AM End time: 02/16/2019 8:51 AM Staffing Anesthesiologist: Effie Berkshire, MD Resident/CRNA: Glory Buff, CRNA Performed: resident/CRNA  Preanesthetic Checklist Completed: patient identified, site marked, surgical consent, pre-op evaluation, timeout performed, IV checked, risks and benefits discussed and monitors and equipment checked Spinal Block Patient position: sitting Prep: DuraPrep Patient monitoring: heart rate, cardiac monitor, continuous pulse ox and blood pressure Approach: midline Location: L3-4 Injection technique: single-shot Needle Needle type: Pencan  Needle gauge: 24 G Needle length: 9 cm Assessment Sensory level: T4 Additional Notes Kit expiration date checked and verified.  Sterile prep and drape, skin local with 1% lidocaine, - heme, - paraesthesia, + CSF pre and post injection, patient tolerated procedure well.

## 2019-02-16 NOTE — Transfer of Care (Signed)
Immediate Anesthesia Transfer of Care Note  Patient: Betty Arroyo  Procedure(s) Performed: TOTAL KNEE ARTHROPLASTY (Left Knee)  Patient Location: PACU  Anesthesia Type:MAC and Spinal  Level of Consciousness: awake, alert  and oriented  Airway & Oxygen Therapy: Patient Spontanous Breathing and Patient connected to nasal cannula oxygen  Post-op Assessment: Report given to RN and Post -op Vital signs reviewed and stable  Post vital signs: Reviewed and stable  Last Vitals:  Vitals Value Taken Time  BP    Temp    Pulse    Resp    SpO2      Last Pain:  Vitals:   02/16/19 0722  TempSrc: Oral         Complications: No apparent anesthesia complications

## 2019-02-16 NOTE — Evaluation (Signed)
Physical Therapy Evaluation Patient Details Name: Betty Arroyo MRN: [id number] DOB: 19-May-1946 Today's Date: 02/16/2019   History of Present Illness  73 yo female s/p L TKR on 02/16/19. PMH includes HTN, DM, CKD III, vertigo.  Clinical Impression  Pt presents with L knee pain, decreased L knee ROM, increased time and effort to perform mobility tasks, and decreased activity tolerance. Pt to benefit from acute PT to address deficits. Pt ambulated 70 ft with min guard assist, verbal cuing for form and safety provided throughout. Pt educated on ankle pumps (20/hour) to perform this afternoon/evening to increase circulation, to pt's tolerance and limited by pain. PT to progress mobility as tolerated, and will continue to follow acutely.        Follow Up Recommendations Follow surgeon's recommendation for DC plan and follow-up therapies;Supervision for mobility/OOB    Equipment Recommendations  None recommended by PT(RW and 3in1 delivered to pt room during PT session)    Recommendations for Other Services       Precautions / Restrictions Precautions Precautions: Fall Restrictions Weight Bearing Restrictions: No Other Position/Activity Restrictions: WBAT      Mobility  Bed Mobility Overal bed mobility: Needs Assistance Bed Mobility: Supine to Sit     Supine to sit: Min guard;HOB elevated     General bed mobility comments: Min guard for safety, verbal cuing for sequencing.  Transfers Overall transfer level: Needs assistance Equipment used: Rolling walker (2 wheeled) Transfers: Sit to/from Stand Sit to Stand: Min guard;From elevated surface         General transfer comment: Min guard for safety, verbal cuing for hand placement when rising.  Ambulation/Gait Ambulation/Gait assistance: Min guard;Min assist;+2 safety/equipment Gait Distance (Feet): 70 Feet Assistive device: Rolling walker (2 wheeled) Gait Pattern/deviations: Step-to pattern;Decreased step length -  right;Decreased step length - left;Antalgic;Decreased weight shift to left;Decreased stance time - left Gait velocity: decr   General Gait Details: Min guard for safety. Verbal cuing for sequencing, placement in RW, turning with RW, decreased WB on LLE during ambulation to prevent buckling.  Stairs            Wheelchair Mobility    Modified Rankin (Stroke Patients Only)       Balance Overall balance assessment: Mild deficits observed, not formally tested                                           Pertinent Vitals/Pain Pain Assessment: 0-10 Pain Score: 3  Pain Location: L knee Pain Descriptors / Indicators: Sore Pain Intervention(s): Limited activity within patient's tolerance;Monitored during session;Premedicated before session;Repositioned;Ice applied    Home Living Family/patient expects to be discharged to:: Private residence Living Arrangements: Spouse/significant other Available Help at Discharge: Family Type of Home: House Home Access: Stairs to enter Entrance Stairs-Rails: Doctor, general practiceight;Left Entrance Stairs-Number of Steps: 7 Home Layout: Multi-level Home Equipment: Shower seat - built in      Prior Function Level of Independence: Independent               Hand Dominance   Dominant Hand: Right    Extremity/Trunk Assessment   Upper Extremity Assessment Upper Extremity Assessment: Overall WFL for tasks assessed    Lower Extremity Assessment Lower Extremity Assessment: Overall WFL for tasks assessed;LLE deficits/detail LLE Deficits / Details: suspected post-surgical weakness; able to perform ankle pumps, quad set, heel slide to 90*, SLR without lift assist or quad  lag LLE Sensation: WNL    Cervical / Trunk Assessment Cervical / Trunk Assessment: Normal  Communication   Communication: No difficulties  Cognition Arousal/Alertness: Awake/alert Behavior During Therapy: WFL for tasks assessed/performed Overall Cognitive Status: Within  Functional Limits for tasks assessed                                        General Comments      Exercises     Assessment/Plan    PT Assessment Patient needs continued PT services  PT Problem List Decreased strength;Decreased mobility;Decreased range of motion;Decreased activity tolerance;Decreased balance;Decreased knowledge of use of DME;Pain       PT Treatment Interventions DME instruction;Therapeutic activities;Gait training;Therapeutic exercise;Patient/family education;Balance training;Stair training;Functional mobility training    PT Goals (Current goals can be found in the Care Plan section)  Acute Rehab PT Goals Patient Stated Goal: go home tomorrow PT Goal Formulation: With patient Time For Goal Achievement: 02/23/19 Potential to Achieve Goals: Good    Frequency 7X/week   Barriers to discharge        Co-evaluation               AM-PAC PT "6 Clicks" Mobility  Outcome Measure Help needed turning from your back to your side while in a flat bed without using bedrails?: A Little Help needed moving from lying on your back to sitting on the side of a flat bed without using bedrails?: A Little Help needed moving to and from a bed to a chair (including a wheelchair)?: A Little Help needed standing up from a chair using your arms (e.g., wheelchair or bedside chair)?: A Little Help needed to walk in hospital room?: A Little Help needed climbing 3-5 steps with a railing? : A Lot 6 Click Score: 17    End of Session Equipment Utilized During Treatment: Gait belt Activity Tolerance: Patient tolerated treatment well Patient left: in chair;with family/visitor present;with call bell/phone within reach;with SCD's reapplied(pt verbalizes she will not get back to bed without pressing call button and waiting for assist) Nurse Communication: Mobility status PT Visit Diagnosis: Other abnormalities of gait and mobility (R26.89);Difficulty in walking, not  elsewhere classified (R26.2)    Time: 1500-1520 PT Time Calculation (min) (ACUTE ONLY): 20 min   Charges:   PT Evaluation $PT Eval Low Complexity: 1 Low          Darely Becknell Conception Chancy, PT Acute Rehabilitation Services Pager [phone]  Office [phone]  Roxine Caddy D Elonda Husky 02/16/2019, 3:46 PM

## 2019-02-16 NOTE — H&P (Signed)
TOTAL KNEE ADMISSION H&P  Patient is being admitted for left total knee arthroplasty.  Subjective:  Chief Complaint:left knee pain.  HPI: Betty Arroyo, 73 y.o. female, has a history of pain and functional disability in the left knee due to arthritis and has failed non-surgical conservative treatments for greater than 12 weeks to includeNSAID's and/or analgesics, corticosteriod injections, viscosupplementation injections, weight reduction as appropriate and activity modification.  Onset of symptoms was gradual, starting 4 years ago with gradually worsening course since that time. The patient noted no past surgery on the left knee(s).  Patient currently rates pain in the left knee(s) at 8 out of 10 with activity. Patient has night pain, worsening of pain with activity and weight bearing, pain that interferes with activities of daily living, pain with passive range of motion and joint swelling.  Patient has evidence of periarticular osteophytes and joint space narrowing by imaging studies. This patient has had failure of all reasonable conservative care. There is no active infection.  Patient Active Problem List   Diagnosis Date Noted  . 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 Type 2 diabetes mellitus with stage 3 chronic kidney disease, without long-term current use of insulin ([facility]) 07/24/2018  . Pure hypercholesterolemia 07/24/2018  . Body mass index (BMI) of 29.0 to 29.9 in adult 07/24/2018   Past Medical History:  Diagnosis Date  . Arthritis   . Chronic kidney disease   . Diabetes mellitus without complication ([place])   . Hypercholesteremia   . Hypertension   . 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 Vertigo, benign paroxysmal     Past Surgical History:  Procedure Laterality Date  . ABDOMINAL HYSTERECTOMY    . COLONOSCOPY  2019  . TONSILLECTOMY  1984    Current Facility-Administered Medications  Medication Dose Route Frequency Provider Last Rate Last Dose  .  bupivacaine liposome (EXPAREL) 1.3 % injection 266 mg  20 mL Other Once Dorna Leitz, MD       Current Outpatient Medications  Medication Sig Dispense Refill Last Dose  . acetaminophen (TYLENOL) 500 MG tablet Take 500 mg by mouth every 8 (eight) hours as needed for moderate pain.     . carvedilol (COREG) 6.25 MG tablet TAKE 2 TABLETS BY MOUTH TWICE DAILY WITH FOOD (Patient taking differently: Take 12.5 mg by mouth 2 (two) times daily with a meal. ) 120 tablet 1   . Cholecalciferol (VITAMIN D3) 50 MCG (2000 UT) capsule Take 2,000 Units by mouth daily.     Marland Kitchen JANUVIA 100 MG tablet Take 100 mg by mouth every Monday, Wednesday, and Friday.      . meclizine (ANTIVERT) 25 MG tablet Take 1 tablet (25 mg total) by mouth 3 (three) times daily as needed for dizziness. 30 tablet 0   . naproxen sodium (ALEVE) 220 MG tablet Take 440 mg by mouth 2 (two) times daily as needed (pain).     . rosuvastatin (CRESTOR) 40 MG tablet Take 1 tablet by mouth once daily 90 tablet 0   . vitamin C (ASCORBIC ACID) 500 MG tablet Take 500 mg by mouth daily.      No Known Allergies  Social History   Tobacco Use  . Smoking status: Never Smoker  . Smokeless tobacco: Never Used  Substance Use Topics  . Alcohol use: No    Family History  Problem Relation Age of Onset  . Diabetes Mother   . Hyperlipidemia Mother   . Hypertension Mother   . Varicose  Veins Mother   . Hyperlipidemia Brother   . Hypertension Brother      ROS ROS: I have reviewed the patient's review of systems thoroughly and there are no positive responses as relates to the HPI. Objective:  Physical Exam  Vital signs in last 24 hours:   Well-developed well-nourished patient in no acute distress. Alert and oriented x3 HEENT:within normal limits Cardiac: Regular rate and rhythm Pulmonary: Lungs clear to auscultation Abdomen: Soft and nontender.  Normal active bowel sounds  Musculoskeletal: (left knee: Painful range of motion.  Limited range of  motion.  No instability.  Trace effusion.  Neurovascular intact distally. Labs: Recent Results (from the past 2160 hour(s))  HM MAMMOGRAPHY     Status: None   Collection Time: 12/01/18 12:00 AM  Result Value Ref Range   HM Mammogram Self Reported Normal 0-4 Bi-Rad, Self Reported Normal  POCT UA - Microalbumin     Status: Abnormal   Collection Time: 12/06/18 11:37 AM  Result Value Ref Range   Microalbumin Ur, POC 80 mg/L   Creatinine, POC 300 mg/dL   Albumin/Creatinine Ratio, Urine, POC 30-300   POCT Urinalysis Dipstick (14481)     Status: Abnormal   Collection Time: 12/06/18 11:38 AM  Result Value Ref Range   Color, UA yellow    Clarity, UA clear    Glucose, UA Negative Negative   Bilirubin, UA negative    Ketones, UA negative    Spec Grav, UA 1.020 1.010 - 1.025   Blood, UA trace-lysed    pH, UA 5.5 5.0 - 8.0   Protein, UA Positive (A) Negative    Comment: trace   Urobilinogen, UA 0.2 0.2 or 1.0 E.U./dL   Nitrite, UA negative    Leukocytes, UA Negative Negative   Appearance clear    Odor none   CMP14+EGFR     Status: Abnormal   Collection Time: 12/06/18 11:41 AM  Result Value Ref Range   Glucose 105 (H) 65 - 99 mg/dL   BUN 17 8 - 27 mg/dL   Creatinine, Ser 1.08 (H) 0.57 - 1.00 mg/dL   GFR calc non Af Amer 52 (L) >59 mL/min/1.73   GFR calc Af Amer 60 >59 mL/min/1.73   BUN/Creatinine Ratio 16 12 - 28   Sodium 142 134 - 144 mmol/L   Potassium 4.0 3.5 - 5.2 mmol/L   Chloride 99 96 - 106 mmol/L   CO2 26 20 - 29 mmol/L   Calcium 10.2 8.7 - 10.3 mg/dL   Total Protein 7.2 6.0 - 8.5 g/dL   Albumin 4.7 3.7 - 4.7 g/dL   Globulin, Total 2.5 1.5 - 4.5 g/dL   Albumin/Globulin Ratio 1.9 1.2 - 2.2   Bilirubin Total 0.7 0.0 - 1.2 mg/dL   Alkaline Phosphatase 109 39 - 117 IU/L   AST 16 0 - 40 IU/L   ALT 15 0 - 32 IU/L  CBC     Status: None   Collection Time: 12/06/18 11:41 AM  Result Value Ref Range   WBC 5.4 3.4 - 10.8 x10E3/uL   RBC 5.15 3.77 - 5.28 x10E6/uL   Hemoglobin  14.7 11.1 - 15.9 g/dL   Hematocrit 45.6 34.0 - 46.6 %   MCV 89 79 - 97 fL   MCH 28.5 26.6 - 33.0 pg   MCHC 32.2 31.5 - 35.7 g/dL   RDW 13.9 11.7 - 15.4 %   Platelets 251 150 - 450 x10E3/uL  Lipid panel     Status: Abnormal  Collection Time: 12/06/18 11:41 AM  Result Value Ref Range   Cholesterol, Total 219 (H) 100 - 199 mg/dL   Triglycerides 108 0 - 149 mg/dL   HDL 46 >39 mg/dL   VLDL Cholesterol Cal 22 5 - 40 mg/dL   LDL Calculated 151 (H) 0 - 99 mg/dL   Chol/HDL Ratio 4.8 (H) 0.0 - 4.4 ratio    Comment:                                   T. Chol/HDL Ratio                                             Men  Women                               1/2 Avg.Risk  3.4    3.3                                   Avg.Risk  5.0    4.4                                2X Avg.Risk  9.6    7.1                                3X Avg.Risk 23.4   11.0   Hemoglobin A1c     Status: Abnormal   Collection Time: 12/06/18 11:41 AM  Result Value Ref Range   Hgb A1c MFr Bld 6.4 (H) 4.8 - 5.6 %    Comment:          Prediabetes: 5.7 - 6.4          Diabetes: >6.4          Glycemic control for adults with diabetes: <7.0    Est. average glucose Bld gHb Est-mCnc 137 mg/dL  HM DIABETES EYE EXAM     Status: None   Collection Time: 12/11/18 12:00 AM  Result Value Ref Range   HM Diabetic Eye Exam No Retinopathy No Retinopathy  Glucose, capillary     Status: None   Collection Time: 02/13/19  1:55 PM  Result Value Ref Range   Glucose-Capillary 94 70 - 99 mg/dL  APTT     Status: None   Collection Time: 02/13/19  2:25 PM  Result Value Ref Range   aPTT 26 24 - 36 seconds    Comment: Performed at [facility], [place] [address], [place], [place] [address]  CBC WITH DIFFERENTIAL     Status: None   Collection Time: 02/13/19  2:25 PM  Result Value Ref Range   WBC 5.8 4.0 - 10.5 K/uL   RBC 4.95 3.87 - 5.11 MIL/uL   Hemoglobin 14.1 12.0 - 15.0 g/dL   HCT 44.2 36.0 - 46.0 %   MCV 89.3 80.0 - 100.0 fL    MCH 28.5 26.0 - 34.0 pg   MCHC 31.9 30.0 - 36.0 g/dL   RDW 14.3 11.5 - 15.5 %   Platelets 224 150 - 400  K/uL   nRBC 0.0 0.0 - 0.2 %   Neutrophils Relative % 65 %   Neutro Abs 3.7 1.7 - 7.7 K/uL   Lymphocytes Relative 22 %   Lymphs Abs 1.3 0.7 - 4.0 K/uL   Monocytes Relative 11 %   Monocytes Absolute 0.6 0.1 - 1.0 K/uL   Eosinophils Relative 2 %   Eosinophils Absolute 0.1 0.0 - 0.5 K/uL   Basophils Relative 0 %   Basophils Absolute 0.0 0.0 - 0.1 K/uL   Immature Granulocytes 0 %   Abs Immature Granulocytes 0.01 0.00 - 0.07 K/uL    Comment: Performed at [facility], [place] [address] [place], [place] [address]  Comprehensive metabolic panel     Status: Abnormal   Collection Time: 02/13/19  2:25 PM  Result Value Ref Range   Sodium 143 135 - 145 mmol/L   Potassium 3.7 3.5 - 5.1 mmol/L   Chloride 107 98 - 111 mmol/L   CO2 28 22 - 32 mmol/L   Glucose, Bld 97 70 - 99 mg/dL   BUN 22 8 - 23 mg/dL   Creatinine, Ser 1.16 (H) 0.44 - 1.00 mg/dL   Calcium 9.5 8.9 - 10.3 mg/dL   Total Protein 7.7 6.5 - 8.1 g/dL   Albumin 4.1 3.5 - 5.0 g/dL   AST 15 15 - 41 U/L   ALT 16 0 - 44 U/L   Alkaline Phosphatase 93 38 - 126 U/L   Total Bilirubin 0.8 0.3 - 1.2 mg/dL   GFR calc non Af Amer 47 (L) >60 mL/min   GFR calc Af Amer 55 (L) >60 mL/min   Anion gap 8 5 - 15    Comment: Performed at [facility], [place] [address], [place], [place] [address]  Protime-INR     Status: None   Collection Time: 02/13/19  2:25 PM  Result Value Ref Range   Prothrombin Time 12.2 11.4 - 15.2 seconds   INR 0.9 0.8 - 1.2    Comment: (NOTE) INR goal varies based on device and disease states. Performed at [facility], [place] [address] [place], [place] [address]   Urinalysis, Routine w reflex microscopic     Status: Abnormal   Collection Time: 02/13/19  2:25 PM  Result Value Ref Range   Color, Urine YELLOW YELLOW   APPearance HAZY (A) CLEAR   Specific Gravity,  Urine 1.023 1.005 - 1.030   pH 5.0 5.0 - 8.0   Glucose, UA NEGATIVE NEGATIVE mg/dL   Hgb urine dipstick NEGATIVE NEGATIVE   Bilirubin Urine NEGATIVE NEGATIVE   Ketones, ur NEGATIVE NEGATIVE mg/dL   Protein, ur 100 (A) NEGATIVE mg/dL   Nitrite NEGATIVE NEGATIVE   Leukocytes,Ua NEGATIVE NEGATIVE   RBC / HPF 0-5 0 - 5 RBC/hpf   WBC, UA 0-5 0 - 5 WBC/hpf   Bacteria, UA RARE (A) NONE SEEN   Squamous Epithelial / LPF 6-10 0 - 5   Mucus PRESENT    Hyaline Casts, UA PRESENT     Comment: Performed at [facility], [place] [address] [place], [place] [address]  Surgical pcr screen     Status: None   Collection Time: 02/13/19  2:25 PM   Specimen: Nasal Mucosa; Nasal Swab  Result Value Ref Range   MRSA, PCR NEGATIVE NEGATIVE   Staphylococcus aureus NEGATIVE NEGATIVE    Comment: (NOTE) The Xpert SA Assay (FDA approved for NASAL specimens in patients 53 years of age and older), is  one component of a comprehensive surveillance program. It is not intended to diagnose infection nor to guide or monitor treatment. Performed at [facility], [place] [address] [place], [place] [address]   SARS CORONAVIRUS 2 (TAT 6-12 HRS) Nasal Swab Aptima Multi Swab     Status: None   Collection Time: 02/13/19  3:06 PM   Specimen: Aptima Multi Swab; Nasal Swab  Result Value Ref Range   SARS Coronavirus 2 NEGATIVE NEGATIVE    Comment: (NOTE) SARS-CoV-2 target nucleic acids are NOT DETECTED. The SARS-CoV-2 RNA is generally detectable in upper and lower respiratory specimens during the acute phase of infection. Negative results do not preclude SARS-CoV-2 infection, do not rule out co-infections with other pathogens, and should not be used as the sole basis for treatment or other patient management decisions. Negative results must be combined with clinical observations, patient history, and epidemiological information. The expected result is Negative. Fact Sheet for  Patients: SugarRoll.be Fact Sheet for Healthcare Providers: https://www.woods-mathews.com/ This test is not yet approved or cleared by the [place] FDA and  has been authorized for detection and/or diagnosis of SARS-CoV-2 by FDA under an Emergency Use Authorization (EUA). This EUA will remain  in effect (meaning this test can be used) for the duration of the COVID-19 declaration under Section 56 4(b)(1) of the Act, 21 U.S.C. section 360bbb-3(b)(1), unless the authorization is terminated or revoked sooner. Performed at [facility], [place] [address], [place], [place] [address]     Estimated body mass index is 30.61 kg/m as calculated from the following:   Height as of 02/13/19: 5' 1" (1.549 m).   Weight as of 02/13/19: 73.5 kg.   Imaging Review Plain radiographs demonstrate severe degenerative joint disease of the left knee(s). The overall alignment ismild varus. The bone quality appears to be fair for age and reported activity level.      Assessment/Plan:  End stage arthritis, left knee   The patient history, physical examination, clinical judgment of the provider and imaging studies are consistent with end stage degenerative joint disease of the left knee(s) and total knee arthroplasty is deemed medically necessary. The treatment options including medical management, injection therapy arthroscopy and arthroplasty were discussed at length. The risks and benefits of total knee arthroplasty were presented and reviewed. The risks due to aseptic loosening, infection, stiffness, patella tracking problems, thromboembolic complications and other imponderables were discussed. The patient acknowledged the explanation, agreed to proceed with the plan and consent was signed. Patient is being admitted for inpatient treatment for surgery, pain control, PT, OT, prophylactic antibiotics, VTE prophylaxis, progressive ambulation and ADL's and discharge  planning. The patient is planning to be discharged home with home health services     Patient's anticipated LOS is less than 2 midnights, meeting these requirements: - Younger than 41 - Lives within 1 hour of care - Has a competent adult at home to recover with post-op recover - NO history of  - Chronic pain requiring opiods  - Diabetes  - Coronary Artery Disease  - Heart failure  - Heart attack  - Stroke  - DVT/VTE  - Cardiac arrhythmia  - Respiratory Failure/COPD  - Renal failure  - Anemia  - Advanced Liver disease

## 2019-02-16 NOTE — Anesthesia Preprocedure Evaluation (Addendum)
Anesthesia Evaluation  Patient identified by MRN, date of birth, ID band Patient awake    Reviewed: Allergy & Precautions, NPO status , Patient's Chart, lab work & pertinent test results  Airway Mallampati: III  TM Distance: >3 FB Neck ROM: Full    Dental  (+) Teeth Intact, Dental Advisory Given   Pulmonary neg pulmonary ROS,    breath sounds clear to auscultation       Cardiovascular hypertension, Pt. on home beta blockers  Rhythm:Regular Rate:Normal     Neuro/Psych negative neurological ROS  negative psych ROS   GI/Hepatic negative GI ROS, Neg liver ROS,   Endo/Other  diabetes, Type 2, Oral Hypoglycemic Agents  Renal/GU Renal InsufficiencyRenal disease     Musculoskeletal  (+) Arthritis ,   Abdominal (+) + obese,   Peds  Hematology negative hematology ROS (+)   Anesthesia Other Findings   Reproductive/Obstetrics                            Anesthesia Physical Anesthesia Plan  ASA: II  Anesthesia Plan: Spinal   Post-op Pain Management:  Regional for Post-op pain   Induction: Intravenous  PONV Risk Score and Plan: 3 and Propofol infusion, Ondansetron and Dexamethasone  Airway Management Planned: Natural Airway and Simple Face Mask  Additional Equipment: None  Intra-op Plan:   Post-operative Plan:   Informed Consent: I have reviewed the patients History and Physical, chart, labs and discussed the procedure including the risks, benefits and alternatives for the proposed anesthesia with the patient or authorized representative who has indicated his/her understanding and acceptance.     Dental advisory given  Plan Discussed with: CRNA  Anesthesia Plan Comments:        Anesthesia Quick Evaluation

## 2019-02-16 NOTE — Discharge Instructions (Signed)

## 2019-02-16 NOTE — Anesthesia Procedure Notes (Signed)
Date/Time: 02/16/2019 8:43 AM Performed by: Glory Buff, CRNA Oxygen Delivery Method: Nasal cannula

## 2019-02-16 NOTE — Progress Notes (Signed)
Assisted Dr. Hollis with left, ultrasound guided, adductor canal block. Side rails up, monitors on throughout procedure. See vital signs in flow sheet. Tolerated Procedure well.  

## 2019-02-16 NOTE — Op Note (Signed)
PATIENT ID:      JAMELA CUMBO  MRN:     [id number] DOB/AGE:    71/71/1947 / 71 y.o.       OPERATIVE REPORT   DATE OF PROCEDURE:  02/16/2019      PREOPERATIVE DIAGNOSIS:   DEGENERATIVE JOINT DISEASE BILATERAL KNEES      Estimated body mass index is 30.61 kg/m as calculated from the following:   Height as of 02/13/19: 5\' 1"  (1.549 m).   Weight as of 02/13/19: 73.5 kg.                                                       POSTOPERATIVE DIAGNOSIS:   DEGENERATIVE JOINT DISEASE BILATERAL KNEES                                                                       PROCEDURE:  Procedure(s): TOTAL KNEE ARTHROPLASTY Using DepuyAttune RP implants #4 Femur, #5Tibia, 8 mm Attune RP bearing, 35 Patella    SURGEON: Alta Corning  ASSISTANT:   Gaspar Skeeters PA-C   (Present and scrubbed throughout the case, critical for assistance with exposure, retraction, instrumentation, and closure.)        ANESTHESIA: spinal, 20cc Exparel, 50cc 0.25% Marcaine EBL: min cc FLUID REPLACEMENT: unk cc crystaloid TOURNIQUET: DRAINS: None TRANEXAMIC ACID: 1gm IV, 2gm topical COMPLICATIONS:  None         INDICATIONS FOR PROCEDURE: The patient has  DEGENERATIVE JOINT DISEASE BILATERAL KNEES, varus deformities, XR shows bone on bone arthritis, lateral subluxation of tibia. Patient has failed all conservative measures including anti-inflammatory medicines, narcotics, attempts at exercise and weight loss, cortisone injections and viscosupplementation.  Risks and benefits of surgery have been discussed, questions answered.   DESCRIPTION OF PROCEDURE: The patient identified by armband, received  IV antibiotics, in the holding area at [facility]. Patient taken to the operating room, appropriate anesthetic monitors were attached, and spinal anesthesia was  induced. IV Tranexamic acid was given.Tourniquet applied high to the operative thigh. Lateral post and foot positioner applied to the table, the lower extremity was then  prepped and draped in usual sterile fashion from the toes to the tourniquet. Time-out procedure was performed. The skin and subcutaneous tissue along the incision was injected with 20 cc of a mixture of Exparel and Marcaine solution, using a 20-gauge by 1-1/2 inch needle. We began the operation, with the knee flexed 130 degrees, by making the anterior midline incision starting at handbreadth above the patella going over the patella 1 cm medial to and 4 cm distal to the tibial tubercle. Small bleeders in the skin and the subcutaneous tissue identified and cauterized. Transverse retinaculum was incised and reflected medially and a medial parapatellar arthrotomy was accomplished. the patella was everted and theprepatellar fat pad resected. The superficial medial collateral ligament was then elevated from anterior to posterior along the proximal flare of the tibia and anterior half of the menisci resected. The knee was hyperflexed exposing bone on bone arthritis. Peripheral and notch osteophytes as well as the cruciate ligaments were then  resected. We continued to work our way around posteriorly along the proximal tibia, and externally rotated the tibia subluxing it out from underneath the femur. A McHale PCL retractor was placed through the notch and a lateral Hohmann retractor placed, and we then entered the proximal tibia in line with the Depuy starter drill in line with the axis of the tibia followed by an intramedullary guide rod and 0-degree posterior slope cutting guide. The tibial cutting guide, 4 degree posterior sloped, was pinned into place allowing resection of 10 mm of bone medially and 2 mm of bone laterally. Satisfied with the tibial resection, we then entered the distal femur 2 mm anterior to the PCL origin with the intramedullary guide rod and applied the distal femoral cutting guide set at 9 mm, with 5 degrees of valgus. This was pinned along the epicondylar axis. At this point, the distal femoral cut  was accomplished without difficulty. We then sized for a #4 femoral component and pinned the guide in 3 degrees of external rotation. The chamfer cutting guide was pinned into place. The anterior, posterior, and chamfer cuts were accomplished without difficulty followed by the Attune RP box cutting guide and the box cut. We also removed posterior osteophytes from the posterior femoral condyles. The posterior capsule was injected with Exparel solution. The knee was brought into full extension. We checked our extension gap and fit a 8 mm bearing. Distracting in extension with a lamina spreader,  bleeders in the posterior capsule, Posterior medial and posterior lateral gutter were cauterized.  The transexamic acid-soaked sponge was then placed in the gap of the knee in extension. The knee was flexed 30. The posterior patella cut was accomplished with the 9.5 mm Attune cutting guide, sized for a 35mm dome, and the fixation pegs drilled.The knee was then once again hyperflexed exposing the proximal tibia. We sized for a # 5 tibial base plate, applied the smokestack and the conical reamer followed by the the Delta fin keel punch. We then hammered into place the Attune RP trial femoral component, drilled the lugs, inserted a  8 mm trial bearing, trial patellar button, and took the knee through range of motion from 0-130 degrees. Medial and lateral ligamentous stability was checked. No thumb pressure was required for patellar Tracking. The tourniquet was @45  min. All trial components were removed, mating surfaces irrigated with pulse lavage, and dried with suction and sponges. 10 cc of the Exparel solution was applied to the cancellus bone of the patella distal femur and proximal tibia.  After waiting 30 seconds, the bony surfaces were again, dried with sponges. A double batch of DePuy HV cement was mixed and applied to all bony metallic mating surfaces except for the posterior condyles of the femur itself. In order, we  hammered into place the tibial tray and removed excess cement, the femoral component and removed excess cement. The final Attune RP bearing was inserted, and the knee brought to full extension with compression. The patellar button was clamped into place, and excess cement removed. The knee was held at 30 flexion with compression, while the cement cured. The wound was irrigated out with normal saline solution pulse lavage. The rest of the Exparel was injected into the parapatellar arthrotomy, subcutaneous tissues, and periosteal tissues. The parapatellar arthrotomy was closed with running #1 Vicryl suture. The subcutaneous tissue with 0 and 2-0 undyed Vicryl suture, and the skin with running 3-0 SQ vicryl. An Aquacil and Ace wrap were applied. The patient was taken to  recovery room without difficulty.   Harvie Junior 02/16/2019, 10:11 AM

## 2019-02-16 NOTE — Anesthesia Procedure Notes (Signed)
Anesthesia Regional Block: Adductor canal block   Pre-Anesthetic Checklist: ,, timeout performed, Correct Patient, Correct Site, Correct Laterality, Correct Procedure, Correct Position, site marked, Risks and benefits discussed,  Surgical consent,  Pre-op evaluation,  At surgeon's request and post-op pain management  Laterality: Left  Prep: chloraprep       Needles:  Injection technique: Single-shot  Needle Type: Echogenic Stimulator Needle     Needle Length: 9cm  Needle Gauge: 21     Additional Needles:   Procedures:,,,, ultrasound used (permanent image in chart),,,,  Narrative:  Start time: 02/16/2019 8:00 AM End time: 02/16/2019 8:10 AM Injection made incrementally with aspirations every 5 mL.  Performed by: Personally  Anesthesiologist: Effie Berkshire, MD  Additional Notes: Patient tolerated the procedure well. Local anesthetic introduced in an incremental fashion under minimal resistance after negative aspirations. No paresthesias were elicited. After completion of the procedure, no acute issues were identified and patient continued to be monitored by RN.

## 2019-02-17 DIAGNOSIS — M170 Bilateral primary osteoarthritis of knee: Secondary | ICD-10-CM | POA: Diagnosis not present

## 2019-02-17 LAB — CBC
HCT: 39.7 % (ref 36.0–46.0)
Hemoglobin: 12.8 g/dL (ref 12.0–15.0)
MCH: 29 pg (ref 26.0–34.0)
MCHC: 32.2 g/dL (ref 30.0–36.0)
MCV: 90 fL (ref 80.0–100.0)
Platelets: 230 10*3/uL (ref 150–400)
RBC: 4.41 MIL/uL (ref 3.87–5.11)
RDW: 14.1 % (ref 11.5–15.5)
WBC: 12.6 10*3/uL — ABNORMAL HIGH (ref 4.0–10.5)
nRBC: 0 % (ref 0.0–0.2)

## 2019-02-17 LAB — GLUCOSE, CAPILLARY: Glucose-Capillary: 118 mg/dL — ABNORMAL HIGH (ref 70–99)

## 2019-02-17 NOTE — Progress Notes (Signed)
Physical Therapy Treatment Patient Details Name: ROCHELL PUETT MRN: [id number] DOB: December 17, 1945 Today's Date: 02/17/2019    History of Present Illness 73 yo female s/p L TKR on 02/16/19. PMH includes HTN, DM, CKD III, vertigo.    PT Comments    Pt is progressing well towards goals. She mobilized OOB with min guard to supervision level assist. Today's skilled session focused on gait training, stair training, and providing pt with HEP. She is expected to d/c home today.   Follow Up Recommendations  Follow surgeon's recommendation for DC plan and follow-up therapies;Supervision for mobility/OOB     Equipment Recommendations  None recommended by PT(RW and 3in1 delivered to pt room during PT session)    Recommendations for Other Services       Precautions / Restrictions Precautions Precautions: Fall Restrictions Weight Bearing Restrictions: No Other Position/Activity Restrictions: WBAT    Mobility  Bed Mobility Overal bed mobility: Modified Independent                Transfers Overall transfer level: Needs assistance Equipment used: Rolling walker (2 wheeled) Transfers: Sit to/from Stand Sit to Stand: Supervision         General transfer comment: Overall good technique. Supervision for safety.  Ambulation/Gait Ambulation/Gait assistance: Min guard;Supervision Gait Distance (Feet): 175 Feet Assistive device: Rolling walker (2 wheeled) Gait Pattern/deviations: Decreased step length - right;Decreased step length - left;Antalgic;Decreased weight shift to left;Decreased stance time - left;Step-to pattern;Step-through pattern Gait velocity: decr   General Gait Details: Initilly min guard for safety. Pt able to progress to supervision. Pt initilly ambulating with step to pattern, but progressed to step through with cues for equal step length.    Stairs Stairs: Yes Stairs assistance: Min guard Stair Management: No rails;Step to pattern;Backwards;With walker Number  of Stairs: 6 General stair comments: min guard for safety. Assist given only to brace RW as pt negotiated steps. Cue and demonstration provided for sequencing and technique.   Wheelchair Mobility    Modified Rankin (Stroke Patients Only)       Balance Overall balance assessment: Mild deficits observed, not formally tested                                          Cognition Arousal/Alertness: Awake/alert Behavior During Therapy: WFL for tasks assessed/performed Overall Cognitive Status: Within Functional Limits for tasks assessed                                        Exercises Total Joint Exercises Quad Sets: AROM;Left;5 reps;Supine Towel Squeeze: AROM;Left;5 reps;Supine Short Arc Quad: AROM;Left;5 reps;Supine Heel Slides: AROM;Left;10 reps;Supine;Seated Hip ABduction/ADduction: AROM;Left;5 reps;Supine Straight Leg Raises: AROM;Left;5 reps;Supine Long Arc Quad: AROM;Left;5 reps;Seated Knee Flexion: AROM;Left;5 reps;Seated(10 sec holds)    General Comments General comments (skin integrity, edema, etc.): assisted to restroom for facial hygiene      Pertinent Vitals/Pain Pain Assessment: No/denies pain    Home Living                      Prior Function            PT Goals (current goals can now be found in the care plan section) Acute Rehab PT Goals Patient Stated Goal: go home tomorrow PT Goal Formulation: With patient Time For Goal  Achievement: 02/23/19 Potential to Achieve Goals: Good Progress towards PT goals: Progressing toward goals    Frequency    7X/week      PT Plan Current plan remains appropriate    Co-evaluation              AM-PAC PT "6 Clicks" Mobility   Outcome Measure  Help needed turning from your back to your side while in a flat bed without using bedrails?: A Little Help needed moving from lying on your back to sitting on the side of a flat bed without using bedrails?: A Little Help  needed moving to and from a bed to a chair (including a wheelchair)?: A Little Help needed standing up from a chair using your arms (e.g., wheelchair or bedside chair)?: A Little Help needed to walk in hospital room?: A Little Help needed climbing 3-5 steps with a railing? : A Little 6 Click Score: 18    End of Session Equipment Utilized During Treatment: Gait belt Activity Tolerance: Patient tolerated treatment well Patient left: in chair;with call bell/phone within reach Nurse Communication: Mobility status PT Visit Diagnosis: Other abnormalities of gait and mobility (R26.89);Difficulty in walking, not elsewhere classified (R26.2)     Time: [id number] PT Time Calculation (min) (ACUTE ONLY): 30 min  Charges:  $Gait Training: 8-22 mins $Therapeutic Exercise: 8-22 mins                     Kallie LocksHannah Haile Bosler, [place]PTA Pager [phone] Acute Rehab   Sheral ApleyHannah E Ahnya Akre 02/17/2019, 12:57 PM

## 2019-02-17 NOTE — Discharge Summary (Signed)
Patient ID: Betty Arroyo MRN: [id number] DOB/AGE: 1945-11-22 73 y.o.  Admit date: 02/16/2019 Discharge date: 02/17/2019  Admission Diagnoses:  Principal Problem:   Primary osteoarthritis of left knee   Discharge Diagnoses:  Same  Past Medical History:  Diagnosis Date  . Arthritis   . Chronic kidney disease   . Diabetes mellitus without complication (HCC)   . Hypercholesteremia   . Hypertension   . 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 Vertigo, benign paroxysmal     Surgeries: Procedure(s): TOTAL KNEE ARTHROPLASTY on 02/16/2019   Consultants:   Discharged Condition: Improved  Hospital Course: Betty Arroyo is an 73 y.o. female who was admitted 02/16/2019 for operative treatment ofPrimary osteoarthritis of left knee. Patient has severe unremitting pain that affects sleep, daily activities, and work/hobbies. After pre-op clearance the patient was taken to the operating room on 02/16/2019 and underwent  Procedure(s): TOTAL KNEE ARTHROPLASTY.    Patient is doing well this morning.  Pain is controlled.  No complaints.  No shortness of breath.  Suitable for discharge home once clears physical therapy.   Exam of the left lower extremity demonstrates dressing in place.  No strikethrough.  No pain with squeezing of the calf.  Able to dorsiflex and plantarflex the ankle.  Sensation intact dorsally and plantarly about the foot.  Extremity is warm and well-perfused.   Patient was given perioperative antibiotics:  Anti-infectives (From admission, onward)   Start     Dose/Rate Route Frequency Ordered Stop   02/16/19 1500  ceFAZolin (ANCEF) IVPB 2g/100 mL premix     2 g 200 mL/hr over 30 Minutes Intravenous Every 6 hours 02/16/19 1159 02/16/19 2036   02/16/19 0715  ceFAZolin (ANCEF) IVPB 2g/100 mL premix     2 g 200 mL/hr over 30 Minutes Intravenous On call to O.R. 02/16/19 [id number] 02/16/19 [id number]       Patient was given sequential compression devices, early ambulation, and  chemoprophylaxis to prevent DVT.  Patient benefited maximally from hospital stay and there were no complications.    Recent vital signs:  Patient Vitals for the past 24 hrs:  BP Temp Temp src Pulse Resp SpO2 Height Weight  02/17/19 0531 (!) 131/56 (!) 97.4 F (36.3 C) Oral (!) 57 20 99 % - -  02/17/19 0214 (!) 142/58 97.6 F (36.4 C) Oral (!) 55 16 95 % - -  02/16/19 2224 135/61 97.6 F (36.4 C) Oral (!) 51 20 99 % - -  02/16/19 1741 (!) 151/60 97.8 F (36.6 C) - 60 16 100 % - -  02/16/19 1706 136/64 - - 62 - - - -  02/16/19 1454 (!) 130/55 - - 62 16 - - -  02/16/19 1409 128/62 97.6 F (36.4 C) - (!) 55 16 97 % - -  02/16/19 1140 134/66 (!) 97.4 F (36.3 C) Axillary (!) 48 15 97 % 5\' 1"  (1.549 m) 73.5 kg  02/16/19 1130 (!) 130/57 (!) 97.5 F (36.4 C) - (!) 49 - 96 % - -  02/16/19 1115 127/65 - - (!) 51 15 95 % - -  02/16/19 1100 118/65 - - (!) 48 16 99 % - -  02/16/19 1045 (!) 122/58 - - (!) 50 14 98 % - -  02/16/19 1035 111/61 (!) 97.5 F (36.4 C) - (!) 48 14 99 % - -     Recent laboratory studies:  Recent Labs    02/17/19 0239  WBC 12.6*  HGB 12.8  HCT 39.7  PLT 230     Discharge Medications:   Allergies as of 02/17/2019   No Known Allergies     Medication List    STOP taking these medications   naproxen sodium 220 MG tablet Commonly known as: ALEVE     TAKE these medications   acetaminophen 500 MG tablet Commonly known as: TYLENOL Take 500 mg by mouth every 8 (eight) hours as needed for moderate pain.   aspirin EC 325 MG tablet Take 1 tablet (325 mg total) by mouth 2 (two) times daily after a meal. Take x 1 month post op to decrease risk of blood clots.   carvedilol 6.25 MG tablet Commonly known as: COREG TAKE 2 TABLETS BY MOUTH TWICE DAILY WITH FOOD What changed: See the new instructions.   docusate sodium 100 MG capsule Commonly known as: Colace Take 1 capsule (100 mg total) by mouth 2 (two) times daily.   Januvia 100 MG tablet Generic drug:  sitaGLIPtin Take 100 mg by mouth every Monday, Wednesday, and Friday.   meclizine 25 MG tablet Commonly known as: ANTIVERT Take 1 tablet (25 mg total) by mouth 3 (three) times daily as needed for dizziness.   oxyCODONE-acetaminophen 5-325 MG tablet Commonly known as: PERCOCET/ROXICET Take 1-2 tablets by mouth every 6 (six) hours as needed for severe pain.   rosuvastatin 40 MG tablet Commonly known as: CRESTOR Take 1 tablet by mouth once daily   tiZANidine 2 MG tablet Commonly known as: ZANAFLEX Take 1 tablet (2 mg total) by mouth every 8 (eight) hours as needed for muscle spasms.   vitamin C 500 MG tablet Commonly known as: ASCORBIC ACID Take 500 mg by mouth daily.   Vitamin D3 50 MCG (2000 UT) capsule Take 2,000 Units by mouth daily.       Diagnostic Studies: Dg Chest 2 View  Result Date: 02/14/2019 CLINICAL DATA:  Preoperative knee replacement.  Hypertension. EXAM: CHEST - 2 VIEW COMPARISON:  None. FINDINGS: Lungs are clear. Heart size and pulmonary vascularity are normal. No adenopathy. There is aortic atherosclerosis. No pneumothorax. There is degenerative change in the thoracic spine. IMPRESSION: No edema or consolidation. Heart size normal. Aortic Atherosclerosis (ICD10-I70.0). Electronically Signed   By: Bretta Bang III M.D.   On: 02/14/2019 08:20    Disposition: Discharge disposition: 01-Home or Self Care       Discharge Instructions    Call MD / Call 911   Complete by: As directed    If you experience chest pain or shortness of breath, CALL 911 and be transported to the hospital emergency room.  If you develope a fever above 101 F, pus (white drainage) or increased drainage or redness at the wound, or calf pain, call your surgeon's office.   Constipation Prevention   Complete by: As directed    Drink plenty of fluids.  Prune juice may be helpful.  You may use a stool softener, such as Colace (over the counter) 100 mg twice a day.  Use MiraLax (over the  counter) for constipation as needed.   Diet - low sodium heart healthy   Complete by: As directed    Increase activity slowly as tolerated   Complete by: As directed       Follow-up Information    Jodi Geralds, MD. Schedule an appointment as soon as possible for a visit in 2 weeks.   Specialty: Orthopedic Surgery Contact information: [address] [place] [address] [phone]  Signed: Erle Crocker 02/17/2019, 9:34 AM

## 2019-02-17 NOTE — Progress Notes (Signed)

## 2019-02-17 NOTE — Progress Notes (Signed)
DC instructions provided and explained in detail to patient. Patient verbalizes understanding; escorted to lobby in wheelchair to be discharged home with spouse.

## 2019-02-17 NOTE — TOC Initial Note (Signed)
Transition of Care [facility]) - Initial/Assessment Note    Patient Details  Name: Betty Arroyo MRN: [id number] Date of Birth: Sep 28, 1945  [facility]) CM/SW Contact:    Joaquin Courts, RN Phone Number: 02/17/2019, 11:35 AM  Clinical Narrative:                 CM spoke with patient at bedside. Patient set up with [facility] for [place]. Patient reports she has rolling walker and 3-in-1.  Expected Discharge Plan: [place] Barriers to Discharge: No Barriers Identified   Patient Goals and CMS Choice Patient states their goals for this hospitalization and ongoing recovery are:: to go home CMS Medicare.gov Compare Post Acute Care list provided to:: Patient Choice offered to / list presented to : Patient  Expected Discharge Plan and Services Expected Discharge Plan: [place]   Discharge Planning Services: CM Consult Post Acute Care Choice: [place] arrangements for the past 2 months: Single Family Home Expected Discharge Date: 02/17/19               DME Arranged: N/A DME Agency: NA       HH Arranged: PT HH Agency: [facility] (formerly [employer]) Date [place]: 02/17/19 Time [place]: 1134 Representative spoke with at [place]: pre arranged by MD office  Prior Living Arrangements/Services Living arrangements for the past 2 months: Single Family Home Lives with:: Spouse Patient language and need for interpreter reviewed:: Yes Do you feel safe going back to the place where you live?: Yes      Need for Family Participation in Patient Care: Yes (Comment) Care giver support system in place?: Yes (comment)   Criminal Activity/Legal Involvement Pertinent to Current Situation/Hospitalization: No - Comment as needed  Activities of Daily Living Home Assistive Devices/Equipment: Blood pressure cuff, CBG Meter ADL Screening (condition at time of admission) Patient's cognitive  ability adequate to safely complete daily activities?: Yes Is the patient deaf or have difficulty hearing?: No Does the patient have difficulty seeing, even when wearing glasses/contacts?: No Does the patient have difficulty concentrating, remembering, or making decisions?: No Patient able to express need for assistance with ADLs?: Yes Does the patient have difficulty dressing or bathing?: No Independently performs ADLs?: Yes (appropriate for developmental age) Does the patient have difficulty walking or climbing stairs?: No Weakness of Legs: None Weakness of Arms/Hands: None  Permission Sought/Granted                  Emotional Assessment Appearance:: Appears stated age Attitude/Demeanor/Rapport: Engaged Affect (typically observed): Accepting Orientation: : Oriented to Place, Oriented to  Time, Oriented to Situation, Oriented to Self   Psych Involvement: No (comment)  Admission diagnosis:  DEGENERATIVE JOINT DISEASE BILATERAL KNEES Patient Active Problem List   Diagnosis Date Noted  . Primary osteoarthritis of left knee 02/16/2019  . 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 Type 2 diabetes mellitus with stage 3 chronic kidney disease, without long-term current use of insulin ([place]) 07/24/2018  . Pure hypercholesterolemia 07/24/2018  . Body mass index (BMI) of 29.0 to 29.9 in adult 07/24/2018   PCP:  Glendale Chard, MD Pharmacy:   [place], [address] [place] [address] Phone: [phone] Fax: [phone]     Social Determinants of Health (SDOH) Interventions    Readmission Risk Interventions No flowsheet data found.

## 2019-02-19 ENCOUNTER — Encounter (HOSPITAL_COMMUNITY): Payer: Self-pay | Admitting: Orthopedic Surgery

## 2019-03-01 ENCOUNTER — Ambulatory Visit: Payer: Medicare Other | Admitting: Internal Medicine

## 2019-03-21 ENCOUNTER — Other Ambulatory Visit: Payer: Self-pay | Admitting: Internal Medicine

## 2019-03-23 NOTE — Op Note (Signed)
NAME: Betty Arroyo, Betty Arroyo MEDICAL RECORD [id number] ACCOUNT [id number] DATE OF BIRTH:1945-12-01 FACILITY: [facility] LOCATION: [id number] PHYSICIAN:Saud Bail Maudie Mercury, MD  OPERATIVE REPORT  DATE OF PROCEDURE:  02/16/2019  ADDENDUM:  The procedure was performed on the left side.  She had a left total knee replacement for left degenerative joint disease.  The procedure was entirely performed on the left side only.  LN/NUANCE  D:03/22/2019 T:03/23/2019 JOB:[id number]

## 2019-03-29 ENCOUNTER — Ambulatory Visit: Payer: Medicare Other | Admitting: Internal Medicine

## 2019-04-11 ENCOUNTER — Encounter: Payer: Self-pay | Admitting: Internal Medicine

## 2019-04-11 ENCOUNTER — Ambulatory Visit: Payer: Medicare Other | Admitting: Internal Medicine

## 2019-04-11 ENCOUNTER — Other Ambulatory Visit: Payer: Self-pay

## 2019-04-11 VITALS — BP 148/72 | HR 87 | Temp 98.1°F | Ht 61.0 in | Wt 157.2 lb

## 2019-04-11 DIAGNOSIS — N183 Chronic kidney disease, stage 3 unspecified: Secondary | ICD-10-CM | POA: Diagnosis not present

## 2019-04-11 DIAGNOSIS — E663 Overweight: Secondary | ICD-10-CM

## 2019-04-11 DIAGNOSIS — M1712 Unilateral primary osteoarthritis, left knee: Secondary | ICD-10-CM

## 2019-04-11 DIAGNOSIS — I129 Hypertensive chronic kidney disease with stage 1 through stage 4 chronic kidney disease, or unspecified chronic kidney disease: Secondary | ICD-10-CM

## 2019-04-11 DIAGNOSIS — Z6829 Body mass index (BMI) 29.0-29.9, adult: Secondary | ICD-10-CM

## 2019-04-11 DIAGNOSIS — F5101 Primary insomnia: Secondary | ICD-10-CM

## 2019-04-11 DIAGNOSIS — E1122 Type 2 diabetes mellitus with diabetic chronic kidney disease: Secondary | ICD-10-CM

## 2019-04-11 DIAGNOSIS — Z96652 Presence of left artificial knee joint: Secondary | ICD-10-CM

## 2019-04-11 MED ORDER — PREVNAR 13 IM SUSP: 0.5000 mL | INTRAMUSCULAR | 0 refills | Status: AC

## 2019-04-11 NOTE — Patient Instructions (Addendum)
Calm - magnesium supplement You can get at health food store OR [employer], Sprouts, or [employer]   Insomnia Insomnia is a sleep disorder that makes it difficult to fall asleep or stay asleep. Insomnia can cause fatigue, low energy, difficulty concentrating, mood swings, and poor performance at work or school. There are three different ways to classify insomnia:  Difficulty falling asleep.  Difficulty staying asleep.  Waking up too early in the morning. Any type of insomnia can be long-term (chronic) or short-term (acute). Both are common. Short-term insomnia usually lasts for three months or less. Chronic insomnia occurs at least three times a week for longer than three months. What are the causes? Insomnia may be caused by another condition, situation, or substance, such as:  Anxiety.  Certain medicines.  Gastroesophageal reflux disease (GERD) or other gastrointestinal conditions.  Asthma or other breathing conditions.  Restless legs syndrome, sleep apnea, or other sleep disorders.  Chronic pain.  Menopause.  Stroke.  Abuse of alcohol, tobacco, or illegal drugs.  Mental health conditions, such as depression.  Caffeine.  Neurological disorders, such as Alzheimer's disease.  An overactive thyroid (hyperthyroidism). Sometimes, the cause of insomnia may not be known. What increases the risk? Risk factors for insomnia include:  Gender. Women are affected more often than men.  Age. Insomnia is more common as you get older.  Stress.  Lack of exercise.  Irregular work schedule or working night shifts.  Traveling between different time zones.  Certain medical and mental health conditions. What are the signs or symptoms? If you have insomnia, the main symptom is having trouble falling asleep or having trouble staying asleep. This may lead to other symptoms, such as:  Feeling fatigued or having low energy.  Feeling nervous about going to sleep.  Not feeling  rested in the morning.  Having trouble concentrating.  Feeling irritable, anxious, or depressed. How is this diagnosed? This condition may be diagnosed based on:  Your symptoms and medical history. Your health care provider may ask about: ? Your sleep habits. ? Any medical conditions you have. ? Your mental health.  A physical exam. How is this treated? Treatment for insomnia depends on the cause. Treatment may focus on treating an underlying condition that is causing insomnia. Treatment may also include:  Medicines to help you sleep.  Counseling or therapy.  Lifestyle adjustments to help you sleep better. Follow these instructions at home: Eating and drinking   Limit or avoid alcohol, caffeinated beverages, and cigarettes, especially close to bedtime. These can disrupt your sleep.  Do not eat a large meal or eat spicy foods right before bedtime. This can lead to digestive discomfort that can make it hard for you to sleep. Sleep habits   Keep a sleep diary to help you and your health care provider figure out what could be causing your insomnia. Write down: ? When you sleep. ? When you wake up during the night. ? How well you sleep. ? How rested you feel the next day. ? Any side effects of medicines you are taking. ? What you eat and drink.  Make your bedroom a dark, comfortable place where it is easy to fall asleep. ? Put up shades or blackout curtains to block light from outside. ? Use a white noise machine to block noise. ? Keep the temperature cool.  Limit screen use before bedtime. This includes: ? Watching TV. ? Using your smartphone, tablet, or computer.  Stick to a routine that includes going to  bed and waking up at the same times every day and night. This can help you fall asleep faster. Consider making a quiet activity, such as reading, part of your nighttime routine.  Try to avoid taking naps during the day so that you sleep better at night.  Get out of  bed if you are still awake after 15 minutes of trying to sleep. Keep the lights down, but try reading or doing a quiet activity. When you feel sleepy, go back to bed. General instructions  Take over-the-counter and prescription medicines only as told by your health care provider.  Exercise regularly, as told by your health care provider. Avoid exercise starting several hours before bedtime.  Use relaxation techniques to manage stress. Ask your health care provider to suggest some techniques that may work well for you. These may include: ? Breathing exercises. ? Routines to release muscle tension. ? Visualizing peaceful scenes.  Make sure that you drive carefully. Avoid driving if you feel very sleepy.  Keep all follow-up visits as told by your health care provider. This is important. Contact a health care provider if:  You are tired throughout the day.  You have trouble in your daily routine due to sleepiness.  You continue to have sleep problems, or your sleep problems get worse. Get help right away if:  You have serious thoughts about hurting yourself or someone else. If you ever feel like you may hurt yourself or others, or have thoughts about taking your own life, get help right away. You can go to your nearest emergency department or call:  Your local emergency services (911 in the [place]  A suicide crisis helpline, such as the National Suicide Prevention Lifeline at 8590470929. This is open 24 hours a day. Summary  Insomnia is a sleep disorder that makes it difficult to fall asleep or stay asleep.  Insomnia can be long-term (chronic) or short-term (acute).  Treatment for insomnia depends on the cause. Treatment may focus on treating an underlying condition that is causing insomnia.  Keep a sleep diary to help you and your health care provider figure out what could be causing your insomnia. This information is not intended to replace advice given to you by your health  care provider. Make sure you discuss any questions you have with your health care provider. Document Released: 06/04/2000 Document Revised: 05/20/2017 Document Reviewed: 03/17/2017 Elsevier Patient Education  2020 [employer].

## 2019-04-12 LAB — BMP8+EGFR
BUN/Creatinine Ratio: 21 (ref 12–28)
BUN: 22 mg/dL (ref 8–27)
CO2: 25 mmol/L (ref 20–29)
Calcium: 9.4 mg/dL (ref 8.7–10.3)
Chloride: 105 mmol/L (ref 96–106)
Creatinine, Ser: 1.07 mg/dL — ABNORMAL HIGH (ref 0.57–1.00)
GFR calc Af Amer: 60 mL/min/{1.73_m2} (ref >59)
GFR calc non Af Amer: 52 mL/min/{1.73_m2} — ABNORMAL LOW (ref >59)
Glucose: 199 mg/dL — ABNORMAL HIGH (ref 65–99)
Potassium: 3.7 mmol/L (ref 3.5–5.2)
Sodium: 143 mmol/L (ref 134–144)

## 2019-04-12 LAB — HEMOGLOBIN A1C
Est. average glucose Bld gHb Est-mCnc: 137 mg/dL
Hgb A1c MFr Bld: 6.4 % — ABNORMAL HIGH (ref 4.8–5.6)

## 2019-04-12 NOTE — Progress Notes (Signed)
Subjective:     Patient ID: Betty Arroyo , female    DOB: 12-11-1945 , 73 y.o.   MRN: [id number]   Chief Complaint  Patient presents with  . Diabetes  . Hypertension    HPI  She is here today for DM check. She has had left knee replacement since her last visit. She reports doing well and has participated fully in physical therapy. She admits she has yet to take her BP meds for the day.   Diabetes She presents for her follow-up diabetic visit. She has type 2 diabetes mellitus. Her disease course has been stable. There are no hypoglycemic associated symptoms. Pertinent negatives for diabetes include no blurred vision, no chest pain and no fatigue. There are no hypoglycemic complications. Diabetic complications include nephropathy. Risk factors for coronary artery disease include diabetes mellitus, dyslipidemia, hypertension and post-menopausal. Her weight is stable. Her breakfast blood glucose is taken between 8-9 am. Her breakfast blood glucose range is generally 90-110 mg/dl. Her dinner blood glucose is taken between 5-6 pm. Her dinner blood glucose range is generally 130-140 mg/dl. An ACE inhibitor/angiotensin II receptor blocker is being taken.  Hypertension This is a chronic problem. The current episode started more than 1 year ago. The problem has been gradually improving since onset. The problem is controlled. Pertinent negatives include no blurred vision or chest pain.     Past Medical History:  Diagnosis Date  . Arthritis   . Chronic kidney disease   . Diabetes mellitus without complication ([place])   . Hypercholesteremia   . Hypertension   . 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 Vertigo, benign paroxysmal      Family History  Problem Relation Age of Onset  . Diabetes Mother   . Hyperlipidemia Mother   . Hypertension Mother   . Varicose Veins Mother   . Hyperlipidemia Brother   . Hypertension Brother      Current Outpatient Medications:  .  acetaminophen  (TYLENOL) 500 MG tablet, Take 500 mg by mouth every 8 (eight) hours as needed for moderate pain., Disp: , Rfl:  .  amitriptyline (ELAVIL) 25 MG tablet, Take 25 mg by mouth at bedtime., Disp: , Rfl:  .  aspirin EC 325 MG tablet, Take 1 tablet (325 mg total) by mouth 2 (two) times daily after a meal. Take x 1 month post op to decrease risk of blood clots., Disp: 60 tablet, Rfl: 0 .  carvedilol (COREG) 6.25 MG tablet, TAKE 2 TABLETS BY MOUTH TWICE DAILY WITH FOOD (Patient taking differently: Take 12.5 mg by mouth 2 (two) times daily with a meal. ), Disp: 120 tablet, Rfl: 1 .  Cholecalciferol (VITAMIN D3) 50 MCG (2000 UT) capsule, Take 2,000 Units by mouth daily., Disp: , Rfl:  .  diclofenac (VOLTAREN) 75 MG EC tablet, Take 75 mg by mouth 2 (two) times daily., Disp: , Rfl:  .  JANUVIA 100 MG tablet, Take 100 mg by mouth every Monday, Wednesday, and Friday. , Disp: , Rfl:  .  meclizine (ANTIVERT) 25 MG tablet, Take 1 tablet (25 mg total) by mouth 3 (three) times daily as needed for dizziness., Disp: 30 tablet, Rfl: 0 .  rosuvastatin (CRESTOR) 40 MG tablet, Take 1 tablet by mouth once daily, Disp: 90 tablet, Rfl: 0 .  vitamin C (ASCORBIC ACID) 500 MG tablet, Take 500 mg by mouth daily., Disp: , Rfl:  .  docusate sodium (COLACE) 100 MG capsule, Take 1 capsule (100 mg total)  by mouth 2 (two) times daily. (Patient not taking: Reported on 04/11/2019), Disp: 30 capsule, Rfl: 0   No Known Allergies   Review of Systems  Constitutional: Negative.  Negative for fatigue.  Eyes: Negative for blurred vision.  Respiratory: Negative.   Cardiovascular: Negative.  Negative for chest pain.  Gastrointestinal: Negative.   Neurological: Negative.   Psychiatric/Behavioral: Positive for sleep disturbance.     Today's Vitals   04/11/19 1607  BP: (!) 148/72  Pulse: 87  Temp: 98.1 F (36.7 C)  TempSrc: Oral  Weight: 157 lb 3.2 oz (71.3 kg)  Height: '5\' 1"'$  (1.549 m)  PainSc: 0-No pain   Body mass index is 29.7  kg/m.   Objective:  Physical Exam Vitals signs and nursing note reviewed.  Constitutional:      Appearance: Normal appearance.  HENT:     Head: Normocephalic and atraumatic.  Cardiovascular:     Rate and Rhythm: Normal rate and regular rhythm.     Heart sounds: Normal heart sounds.  Pulmonary:     Effort: Pulmonary effort is normal.     Breath sounds: Normal breath sounds.  Musculoskeletal:     Comments: Left knee with surgical scar. There is no surrounding erythema or drainage.   Skin:    General: Skin is warm.  Neurological:     General: No focal deficit present.     Mental Status: She is alert.  Psychiatric:        Mood and Affect: Mood normal.        Behavior: Behavior normal.         Assessment And Plan:     1. Type 2 diabetes mellitus with stage 3 chronic kidney disease, without long-term current use of insulin, unspecified whether stage 3a or 3b CKD ([place])  I will check labs as listed below. I will make further recommendations once her labs are available for review. She is also encouraged to stay well hydrated.   - Hemoglobin A1c - BMP8+EGFR  2. Parenchymal renal hypertension, stage 1 through stage 4 or unspecified chronic kidney disease  Chronic, fair control.  She will continue with current meds. She is encouraged to avoid adding salt to her foods. She will rto in four months for re-evaluation.   3. Localized osteoarthritis of left knee  She is s/p left knee replacement.  She reports her pain is well controlled.   4. Primary insomnia  Unfortunately, she has experienced insomnia since having knee surgery. She is advised to start taking magnesium nightly. If not beneficial, she can add Melatonin nightly. She will let me know if her sx persist. Importance of good bedtime hygiene was also discussed with the patient.   5. Overweight with body mass index (BMI) of 29 to 29.9 in adult  She is encouraged to strive for BMI less than 27 to decrease cardiac risk. She  is encouraged to incorporate more exercise into her daily routine as tolerated.   Maximino Greenland, MD    THE PATIENT IS ENCOURAGED TO PRACTICE SOCIAL DISTANCING DUE TO THE COVID-19 PANDEMIC.

## 2019-04-26 ENCOUNTER — Encounter: Payer: Self-pay | Admitting: Internal Medicine

## 2019-05-09 ENCOUNTER — Other Ambulatory Visit: Payer: Self-pay | Admitting: Internal Medicine

## 2019-05-10 ENCOUNTER — Other Ambulatory Visit: Payer: Self-pay

## 2019-05-10 MED ORDER — ROSUVASTATIN CALCIUM 40 MG PO TABS: 40.0000 mg | ORAL_TABLET | Freq: Every day | ORAL | 0 refills | Status: DC

## 2019-05-22 ENCOUNTER — Telehealth: Payer: Self-pay

## 2019-05-22 NOTE — Telephone Encounter (Signed)
The pt was asked if she was willing to participate in a cholesterol study and the pt said yes.  The pt was told that her januvia samples are ready for pickup.

## 2019-07-16 ENCOUNTER — Other Ambulatory Visit: Payer: Self-pay | Admitting: Internal Medicine

## 2019-07-17 ENCOUNTER — Other Ambulatory Visit: Payer: Self-pay | Admitting: Internal Medicine

## 2019-07-17 MED ORDER — ROSUVASTATIN CALCIUM 40 MG PO TABS: 40.0000 mg | ORAL_TABLET | Freq: Every day | ORAL | 1 refills | Status: DC

## 2019-07-23 ENCOUNTER — Telehealth: Payer: Self-pay

## 2019-07-23 ENCOUNTER — Ambulatory Visit: Payer: Self-pay | Admitting: Pharmacist

## 2019-07-23 DIAGNOSIS — N183 Chronic kidney disease, stage 3 unspecified: Secondary | ICD-10-CM

## 2019-07-23 NOTE — Telephone Encounter (Signed)
The pt was told that her samples of Januvia is ready for pickup.  The pt was asked if she wanted to speak with Raynelle Fanning the pharmacist to see if she can help her with rescources to get her medications cheaper and the pt said yes because the [place] is costing her $50 a month.

## 2019-07-27 NOTE — Progress Notes (Signed)
Chronic Care Management   Initial Visit Note  07/23/2019 Name: Betty Arroyo MRN: [id number] DOB: 05/27/1946  Referred by: Sanders, Robyn, MD Reason for referral : No chief complaint on file.   Betty Arroyo is a 73 y.o. year old female who is a primary care patient of Sanders, Robyn, MD. The CCM team was consulted for assistance with chronic disease management and care coordination needs related to DMII  Review of patient status, including review of consultants reports, relevant laboratory and other test results, and collaboration with appropriate care team members and the patient's provider was performed as part of comprehensive patient evaluation and provision of chronic care management services.    Patient was contacted via telephone for today's visit regarding her diabetes and chronic care management   Medications: Outpatient Encounter Medications as of 07/23/2019  Medication Sig  . acetaminophen (TYLENOL) 500 MG tablet Take 500 mg by mouth every 8 (eight) hours as needed for moderate pain.  . amitriptyline (ELAVIL) 25 MG tablet Take 25 mg by mouth at bedtime.  . carvedilol (COREG) 6.25 MG tablet TAKE 2 TABLETS BY MOUTH TWICE DAILY WITH FOOD  . Cholecalciferol (VITAMIN D3) 50 MCG (2000 UT) capsule Take 2,000 Units by mouth daily.  . diclofenac (VOLTAREN) 75 MG EC tablet Take 75 mg by mouth 2 (two) times daily.  . docusate sodium (COLACE) 100 MG capsule Take 1 capsule (100 mg total) by mouth 2 (two) times daily. (Patient not taking: Reported on 04/11/2019)  . JANUVIA 100 MG tablet Take 100 mg by mouth every Monday, Wednesday, and Friday.   . meclizine (ANTIVERT) 25 MG tablet Take 1 tablet (25 mg total) by mouth 3 (three) times daily as needed for dizziness.  . rosuvastatin (CRESTOR) 40 MG tablet Take 1 tablet (40 mg total) by mouth daily.  . vitamin C (ASCORBIC ACID) 500 MG tablet Take 500 mg by mouth daily.  . [DISCONTINUED] aspirin EC 325 MG tablet Take 1 tablet (325 mg total) by  mouth 2 (two) times daily after a meal. Take x 1 month post op to decrease risk of blood clots.   No facility-administered encounter medications on file as of 07/23/2019.     Objective:   Goals Addressed            This Visit's Progress     Patient Stated   . I would like to manage my diabetes (pt-stated)       Current Barriers:  . Diabetes: T2DM; complicated by chronic medical conditions including HTN, HLD, most recent A1c 6.4% . Current antihyperglycemic regimen: Januvia 100mg o Will attempt patient assistance for Januvia with [employer] . Denies hypoglycemic symptoms; denies hyperglycemic symptoms . Current exercise: n/a . Current blood glucose readings: FBG 90-110 . Cardiovascular risk reduction: o Current hypertensive regimen: coreg - Discuss ACEi/ARB o Current hyperlipidemia regimen: rosuvastatin 40mg qhs o Current antiplatelet regimen: n/a  Pharmacist Clinical Goal(s):  . Over the next 90 days, patient will work with PharmD and primary care provider to address needs related to diabetes management  Interventions: . Comprehensive medication review performed, medication list updated in electronic medical record  Patient Self Care Activities:  . Patient will check blood glucose daily, document, and provide at future appointments . Patient will focus on medication adherence . Patient will take medications as prescribed . Patient will contact provider with any episodes of hypoglycemia . Patient will report any questions or concerns to provider   Initial goal documentation           Ms. Landing was given information about Chronic Care Management services today including:  1. CCM service includes personalized support from designated clinical staff supervised by her physician, including individualized plan of care and coordination with other care providers 2. 24/7 contact phone numbers for assistance for urgent and routine care needs. 3. Service will only be billed when office  clinical staff spend 20 minutes or more in a month to coordinate care. 4. Only one practitioner may furnish and bill the service in a calendar month. 5. The patient may stop CCM services at any time (effective at the end of the month) by phone call to the office staff. 6. The patient will be responsible for cost sharing (co-pay) of up to 20% of the service fee (after annual deductible is met).  Patient agreed to services and verbal consent obtained.   Plan:   The care management team will reach out to the patient again over the next 30 days.   Provider Signature Regina Eck, PharmD, BCPS Clinical Pharmacist, [facility] [place] [facility]: [phone]

## 2019-07-27 NOTE — Patient Instructions (Signed)
Visit Information  Goals Addressed            This Visit's Progress     Patient Stated   . I would like to manage my diabetes (pt-stated)       Current Barriers:  . Diabetes: T2DM; complicated by chronic medical conditions including HTN, HLD, most recent A1c 6.4% . Current antihyperglycemic regimen: Januvia 100mg  o Will attempt patient assistance for Januvia with [employer] . Denies hypoglycemic symptoms; denies hyperglycemic symptoms . Current exercise: n/a . Current blood glucose readings: FBG 90-110 . Cardiovascular risk reduction: o Current hypertensive regimen: coreg - Discuss ACEi/ARB o Current hyperlipidemia regimen: rosuvastatin 40mg  qhs o Current antiplatelet regimen: n/a  Pharmacist Clinical Goal(s):  Over the next 90 days, patient will work with PharmD and primary care provider to address needs related to diabetes management  Interventions: . Comprehensive medication review performed, medication list updated in electronic medical record  Patient Self Care Activities:  . Patient will check blood glucose daily, document, and provide at future appointments . Patient will focus on medication adherence . Patient will take medications as prescribed . Patient will contact provider with any episodes of hypoglycemia . Patient will report any questions or concerns to provider   Initial goal documentation        The patient verbalized understanding of instructions provided today and declined a print copy of patient instruction materials.   The care management team will reach out to the patient again over the next 30 days.   SIGNATURE , PharmD, BCPS Clinical Pharmacist, [facility]  [facility]  Direct Dial: [phone]

## 2019-08-14 ENCOUNTER — Encounter: Payer: Self-pay | Admitting: Internal Medicine

## 2019-08-14 ENCOUNTER — Ambulatory Visit (INDEPENDENT_AMBULATORY_CARE_PROVIDER_SITE_OTHER): Payer: Medicare PPO | Admitting: Internal Medicine

## 2019-08-14 ENCOUNTER — Other Ambulatory Visit: Payer: Self-pay

## 2019-08-14 VITALS — BP 128/76 | HR 52 | Temp 97.7°F | Ht 61.2 in | Wt 155.0 lb

## 2019-08-14 DIAGNOSIS — R519 Headache, unspecified: Secondary | ICD-10-CM | POA: Diagnosis not present

## 2019-08-14 DIAGNOSIS — I129 Hypertensive chronic kidney disease with stage 1 through stage 4 chronic kidney disease, or unspecified chronic kidney disease: Secondary | ICD-10-CM

## 2019-08-14 DIAGNOSIS — N183 Chronic kidney disease, stage 3 unspecified: Secondary | ICD-10-CM

## 2019-08-14 DIAGNOSIS — E663 Overweight: Secondary | ICD-10-CM

## 2019-08-14 DIAGNOSIS — Z6829 Body mass index (BMI) 29.0-29.9, adult: Secondary | ICD-10-CM

## 2019-08-14 DIAGNOSIS — E1122 Type 2 diabetes mellitus with diabetic chronic kidney disease: Secondary | ICD-10-CM

## 2019-08-14 DIAGNOSIS — R42 Dizziness and giddiness: Secondary | ICD-10-CM | POA: Diagnosis not present

## 2019-08-14 NOTE — Patient Instructions (Signed)

## 2019-08-14 NOTE — Progress Notes (Signed)
This visit occurred during the SARS-CoV-2 public health emergency.  Safety protocols were in place, including screening questions prior to the visit, additional usage of staff PPE, and extensive cleaning of exam room while observing appropriate contact time as indicated for disinfecting solutions.  Subjective:     Patient ID: Betty Arroyo , female    DOB: 05/04/1946 , 74 y.o.   MRN: [id number]   Chief Complaint  Patient presents with  . Diabetes  . Hypertension    HPI  Diabetes She presents for her follow-up diabetic visit. She has type 2 diabetes mellitus. Her disease course has been stable. Hypoglycemia symptoms include dizziness and headaches. Pertinent negatives for diabetes include no blurred vision, no chest pain and no fatigue. There are no hypoglycemic complications. Diabetic complications include nephropathy. Risk factors for coronary artery disease include diabetes mellitus, dyslipidemia, hypertension and post-menopausal. Her weight is stable. Her breakfast blood glucose is taken between 8-9 am. Her breakfast blood glucose range is generally 90-110 mg/dl. Her dinner blood glucose is taken between 5-6 pm. Her dinner blood glucose range is generally 130-140 mg/dl. An ACE inhibitor/angiotensin II receptor blocker is being taken.  Hypertension This is a chronic problem. The current episode started more than 1 year ago. The problem has been gradually improving since onset. The problem is controlled. Associated symptoms include headaches. Pertinent negatives include no blurred vision or chest pain. The current treatment provides moderate improvement. Compliance problems include exercise.  Hypertensive end-organ damage includes kidney disease.     Past Medical History:  Diagnosis Date  . Arthritis   . Chronic kidney disease   . Diabetes mellitus without complication ([place])   . Hypercholesteremia   . Hypertension   . 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 Vertigo, benign  paroxysmal      Family History  Problem Relation Age of Onset  . Diabetes Mother   . Hyperlipidemia Mother   . Hypertension Mother   . Varicose Veins Mother   . Hyperlipidemia Brother   . Hypertension Brother      Current Outpatient Medications:  .  acetaminophen (TYLENOL) 500 MG tablet, Take 500 mg by mouth every 8 (eight) hours as needed for moderate pain., Disp: , Rfl:  .  amitriptyline (ELAVIL) 25 MG tablet, Take 25 mg by mouth at bedtime., Disp: , Rfl:  .  carvedilol (COREG) 6.25 MG tablet, TAKE 2 TABLETS BY MOUTH TWICE DAILY WITH FOOD, Disp: 180 tablet, Rfl: 1 .  Cholecalciferol (VITAMIN D3) 50 MCG (2000 UT) capsule, Take 2,000 Units by mouth daily., Disp: , Rfl:  .  diclofenac (VOLTAREN) 75 MG EC tablet, Take 75 mg by mouth 2 (two) times daily., Disp: , Rfl:  .  JANUVIA 100 MG tablet, Take 100 mg by mouth every Monday, Wednesday, and Friday. , Disp: , Rfl:  .  meclizine (ANTIVERT) 25 MG tablet, Take 1 tablet (25 mg total) by mouth 3 (three) times daily as needed for dizziness., Disp: 30 tablet, Rfl: 0 .  rosuvastatin (CRESTOR) 40 MG tablet, Take 1 tablet (40 mg total) by mouth daily., Disp: 90 tablet, Rfl: 1 .  vitamin C (ASCORBIC ACID) 500 MG tablet, Take 500 mg by mouth daily., Disp: , Rfl:  .  docusate sodium (COLACE) 100 MG capsule, Take 1 capsule (100 mg total) by mouth 2 (two) times daily. (Patient not taking: Reported on 04/11/2019), Disp: 30 capsule, Rfl: 0   No Known Allergies   Review of Systems  Constitutional: Negative.  Negative for fatigue.  HENT: Positive for sinus pressure.        She c/o headaches. She thinks they are due to her sinuses. No fever/chills.   Eyes: Negative for blurred vision.  Respiratory: Negative.   Cardiovascular: Negative.  Negative for chest pain.  Gastrointestinal: Negative.   Neurological: Positive for dizziness and headaches.  Psychiatric/Behavioral: Negative.      Today's Vitals   08/14/19 1455  BP: 110/64  Pulse: (!) 51   Temp: 97.7 F (36.5 C)  Weight: 155 lb (70.3 kg)  Height: 5' 1.2" (1.554 m)  PainSc: 0-No pain   Body mass index is 29.1 kg/m.   Wt Readings from Last 3 Encounters:  08/14/19 155 lb (70.3 kg)  04/11/19 157 lb 3.2 oz (71.3 kg)  02/16/19 162 lb (73.5 kg)     Objective:  Physical Exam Vitals and nursing note reviewed.  Constitutional:      Appearance: Normal appearance.  HENT:     Head: Normocephalic and atraumatic.     Right Ear: Tympanic membrane, ear canal and external ear normal.     Left Ear: Tympanic membrane, ear canal and external ear normal.  Cardiovascular:     Rate and Rhythm: Normal rate and regular rhythm.     Heart sounds: Normal heart sounds.  Pulmonary:     Effort: Pulmonary effort is normal.     Breath sounds: Normal breath sounds.  Skin:    General: Skin is warm.  Neurological:     General: No focal deficit present.     Mental Status: She is alert.  Psychiatric:        Mood and Affect: Mood normal.        Behavior: Behavior normal.         Assessment And Plan:    1. Type 2 diabetes mellitus with stage 3 chronic kidney disease, without long-term current use of insulin, unspecified whether stage 3a or 3b CKD ([facility])  Chronic, I will check labs as listed below.  I will make further recommendations once her labs are available for review. I will check renal function today.   - CMP14+EGFR - Hemoglobin A1c  2. Parenchymal renal hypertension, stage 1 through stage 4 or unspecified chronic kidney disease  Well controlled. She will continue with current meds. She is encouraged to avoid adding salt to her foods.   3. Dizziness  Orthostatics performed, she does appear to be dehydrated.  I suspect her sx are due to sinus congestion along with slight dehydration. She is encouraged to increase her water intake.    4. Sinus headache  She was given samples/rx Norel AD to take twice daily as needed.  She will let me know if her sx persist.   5. Overweight  with body mass index (BMI) of 29 to 29.9 in adult  Her BMI is stable for her demographic. She is encouraged to exercise at least 150 minutes per week.   Maximino Greenland, MD    THE PATIENT IS ENCOURAGED TO PRACTICE SOCIAL DISTANCING DUE TO THE COVID-19 PANDEMIC.

## 2019-08-15 LAB — CMP14+EGFR
ALT: 16 IU/L (ref 0–32)
AST: 16 IU/L (ref 0–40)
Albumin/Globulin Ratio: 1.7 (ref 1.2–2.2)
Albumin: 4.5 g/dL (ref 3.7–4.7)
Alkaline Phosphatase: 106 IU/L (ref 39–117)
BUN/Creatinine Ratio: 20 (ref 12–28)
BUN: 22 mg/dL (ref 8–27)
Bilirubin Total: 0.6 mg/dL (ref 0.0–1.2)
CO2: 22 mmol/L (ref 20–29)
Calcium: 10.1 mg/dL (ref 8.7–10.3)
Chloride: 102 mmol/L (ref 96–106)
Creatinine, Ser: 1.1 mg/dL — ABNORMAL HIGH (ref 0.57–1.00)
GFR calc Af Amer: 58 mL/min/{1.73_m2} — ABNORMAL LOW (ref >59)
GFR calc non Af Amer: 51 mL/min/{1.73_m2} — ABNORMAL LOW (ref >59)
Globulin, Total: 2.6 g/dL (ref 1.5–4.5)
Glucose: 77 mg/dL (ref 65–99)
Potassium: 3.6 mmol/L (ref 3.5–5.2)
Sodium: 141 mmol/L (ref 134–144)
Total Protein: 7.1 g/dL (ref 6.0–8.5)

## 2019-08-15 LAB — HEMOGLOBIN A1C
Est. average glucose Bld gHb Est-mCnc: 134 mg/dL
Hgb A1c MFr Bld: 6.3 % — ABNORMAL HIGH (ref 4.8–5.6)

## 2019-08-18 ENCOUNTER — Ambulatory Visit: Payer: Medicare PPO | Attending: Internal Medicine

## 2019-08-18 DIAGNOSIS — Z23 Encounter for immunization: Secondary | ICD-10-CM

## 2019-08-18 NOTE — Progress Notes (Signed)
   Covid-19 Vaccination Clinic  Name:  Betty Arroyo    MRN: [id number] DOB: [birth year]  08/18/2019  Ms. Betty Arroyo was observed post Covid-19 immunization for 15 minutes without incidence. She was provided with Vaccine Information Sheet and instruction to access the V-Safe system.   Ms. Betty Arroyo was instructed to call 911 with any severe reactions post vaccine: Marland Kitchen Difficulty breathing  . Swelling of your face and throat  . A fast heartbeat  . A bad rash all over your body  . Dizziness and weakness    Immunizations Administered    Name Date Dose VIS Date Route   Pfizer COVID-19 Vaccine 08/18/2019  5:23 PM 0.3 mL 06/01/2019 Intramuscular   Manufacturer: [employer], [employer]   Lot: [id number]   NDC: [address]-9

## 2019-08-22 ENCOUNTER — Other Ambulatory Visit: Payer: Self-pay

## 2019-08-22 MED ORDER — NOREL AD 4-10-325 MG PO TABS: ORAL_TABLET | ORAL | 0 refills | Status: DC

## 2019-09-08 ENCOUNTER — Ambulatory Visit: Payer: Medicare PPO

## 2019-09-12 ENCOUNTER — Other Ambulatory Visit: Payer: Self-pay | Admitting: Pharmacy Technician

## 2019-09-12 NOTE — Patient Outreach (Signed)
[facility]) Care Management  09/12/2019  Betty Arroyo [birth year] [id number]   Follow up call placed to [employer] regarding patient assistance application(s) for Royce Macadamia confirms patient assistance application was received. Attestation form mailed out to patient today, 3/17 for completion.   Unsuccessful call placed to patient regarding patient assistance application(s) for Januvia , HIPAA compliant voicemail left.   Follow up:  Will make additional call attempt in 3-5 business days if call has not been returned.  Suzan Slick Effie Shy, CPhT Certified Pharmacy Technician [facility]

## 2019-11-06 ENCOUNTER — Other Ambulatory Visit: Payer: Self-pay | Admitting: Pharmacy Technician

## 2019-11-06 NOTE — Patient Outreach (Signed)
[facility])   11/06/2019  Betty Arroyo 12/26/1945 [id number]   Follow up call placed to [employer] regarding patient assistance application(s) for [name] confirms patient has not returned attestation form that was mailed out to her.   Follow up:  Will remove myself from care team due to patient not returning required form to company.  Suzan Slick Effie Shy, CPhT Certified Pharmacy Technician [facility]

## 2019-11-13 ENCOUNTER — Other Ambulatory Visit: Payer: Self-pay | Admitting: Internal Medicine

## 2019-11-14 ENCOUNTER — Other Ambulatory Visit: Payer: Self-pay

## 2019-11-29 ENCOUNTER — Telehealth: Payer: Self-pay

## 2019-11-29 NOTE — Telephone Encounter (Signed)
The pt was notified that her samples of Januvia is ready for pickup.

## 2019-12-07 DIAGNOSIS — Z1231 Encounter for screening mammogram for malignant neoplasm of breast: Secondary | ICD-10-CM | POA: Diagnosis not present

## 2019-12-07 LAB — HM MAMMOGRAPHY

## 2019-12-10 DIAGNOSIS — H40033 Anatomical narrow angle, bilateral: Secondary | ICD-10-CM | POA: Diagnosis not present

## 2019-12-10 DIAGNOSIS — H2513 Age-related nuclear cataract, bilateral: Secondary | ICD-10-CM | POA: Diagnosis not present

## 2019-12-10 LAB — HM DIABETES EYE EXAM

## 2019-12-11 ENCOUNTER — Encounter: Payer: Self-pay | Admitting: Internal Medicine

## 2019-12-12 ENCOUNTER — Encounter: Payer: Self-pay | Admitting: Internal Medicine

## 2019-12-12 ENCOUNTER — Other Ambulatory Visit: Payer: Self-pay

## 2019-12-12 ENCOUNTER — Ambulatory Visit: Payer: Medicare PPO

## 2019-12-12 ENCOUNTER — Ambulatory Visit (INDEPENDENT_AMBULATORY_CARE_PROVIDER_SITE_OTHER): Payer: Medicare PPO | Admitting: Internal Medicine

## 2019-12-12 VITALS — BP 124/72 | HR 64 | Temp 97.6°F | Ht 59.6 in | Wt 160.0 lb

## 2019-12-12 DIAGNOSIS — Z0000 Encounter for general adult medical examination without abnormal findings: Secondary | ICD-10-CM

## 2019-12-12 DIAGNOSIS — I129 Hypertensive chronic kidney disease with stage 1 through stage 4 chronic kidney disease, or unspecified chronic kidney disease: Secondary | ICD-10-CM

## 2019-12-12 DIAGNOSIS — E1122 Type 2 diabetes mellitus with diabetic chronic kidney disease: Secondary | ICD-10-CM | POA: Diagnosis not present

## 2019-12-12 DIAGNOSIS — N183 Chronic kidney disease, stage 3 unspecified: Secondary | ICD-10-CM | POA: Diagnosis not present

## 2019-12-12 LAB — POCT URINALYSIS DIPSTICK
Bilirubin, UA: NEGATIVE
Glucose, UA: NEGATIVE
Ketones, UA: NEGATIVE
Leukocytes, UA: NEGATIVE
Nitrite, UA: NEGATIVE
Protein, UA: POSITIVE — AB
Spec Grav, UA: 1.02 (ref 1.010–1.025)
Urobilinogen, UA: 0.2 E.U./dL
pH, UA: 5.5 (ref 5.0–8.0)

## 2019-12-12 LAB — POCT UA - MICROALBUMIN
Creatinine, POC: 300 mg/dL
Microalbumin Ur, POC: 150 mg/L

## 2019-12-12 NOTE — Patient Instructions (Signed)
Betty Arroyo , Thank you for taking time to come for your Medicare Wellness Visit. I appreciate your ongoing commitment to your health goals. Please review the following plan we discussed and let me know if I can assist you in the future.   Screening recommendations/referrals: Colonoscopy: completed 04/14/2018. Due 04/14/2028 Mammogram: completed 12/07/2019. Due 12/06/2020 Bone Density: 03/16/2017 Recommended yearly ophthalmology/optometry visit for glaucoma screening and checkup Recommended yearly dental visit for hygiene and checkup  Vaccinations: Influenza vaccine: completed 04/24/2019 due 01/20/2020 Pneumococcal vaccine: completed 04/24/2019 Tdap vaccine: completed 05/04/2012 Shingles vaccine: discussed   Covid-19:07/28/2019, 08/18/2019  Advanced directives: Please bring a copy of your POA (Power of Attorney) and/or Living Will to your next appointment.   Conditions/risks identified: obesity  Next appointment: Follow up in one year for your annual wellness visit    Preventive Care 65 Years and Older, Female Preventive care refers to lifestyle choices and visits with your health care provider that can promote health and wellness. What does preventive care include?  A yearly physical exam. This is also called an annual well check.  Dental exams once or twice a year.  Routine eye exams. Ask your health care provider how often you should have your eyes checked.  Personal lifestyle choices, including:  Daily care of your teeth and gums.  Regular physical activity.  Eating a healthy diet.  Avoiding tobacco and drug use.  Limiting alcohol use.  Practicing safe sex.  Taking low-dose aspirin every day.  Taking vitamin and mineral supplements as recommended by your health care provider. What happens during an annual well check? The services and screenings done by your health care provider during your annual well check will depend on your age, overall health, lifestyle risk factors,  and family history of disease. Counseling  Your health care provider may ask you questions about your:  Alcohol use.  Tobacco use.  Drug use.  Emotional well-being.  Home and relationship well-being.  Sexual activity.  Eating habits.  History of falls.  Memory and ability to understand (cognition).  Work and work Astronomer.  Reproductive health. Screening  You may have the following tests or measurements:  Height, weight, and BMI.  Blood pressure.  Lipid and cholesterol levels. These may be checked every 5 years, or more frequently if you are over 29 years old.  Skin check.  Lung cancer screening. You may have this screening every year starting at age 7 if you have a 30-pack-year history of smoking and currently smoke or have quit within the past 15 years.  Fecal occult blood test (FOBT) of the stool. You may have this test every year starting at age 51.  Flexible sigmoidoscopy or colonoscopy. You may have a sigmoidoscopy every 5 years or a colonoscopy every 10 years starting at age 53.  Hepatitis C blood test.  Hepatitis B blood test.  Sexually transmitted disease (STD) testing.  Diabetes screening. This is done by checking your blood sugar (glucose) after you have not eaten for a while (fasting). You may have this done every 1-3 years.  Bone density scan. This is done to screen for osteoporosis. You may have this done starting at age 8.  Mammogram. This may be done every 1-2 years. Talk to your health care provider about how often you should have regular mammograms. Talk with your health care provider about your test results, treatment options, and if necessary, the need for more tests. Vaccines  Your health care provider may recommend certain vaccines, such as:  Influenza vaccine.  This is recommended every year.  Tetanus, diphtheria, and acellular pertussis (Tdap, Td) vaccine. You may need a Td booster every 10 years.  Zoster vaccine. You may need  this after age 71.  Pneumococcal 13-valent conjugate (PCV13) vaccine. One dose is recommended after age 74.  Pneumococcal polysaccharide (PPSV23) vaccine. One dose is recommended after age 38. Talk to your health care provider about which screenings and vaccines you need and how often you need them. This information is not intended to replace advice given to you by your health care provider. Make sure you discuss any questions you have with your health care provider. Document Released: 07/04/2015 Document Revised: 02/25/2016 Document Reviewed: 04/08/2015 Elsevier Interactive Patient Education  2017 [place] Prevention in the Home Falls can cause injuries. They can happen to people of all ages. There are many things you can do to make your home safe and to help prevent falls. What can I do on the outside of my home?  Regularly fix the edges of walkways and driveways and fix any cracks.  Remove anything that might make you trip as you walk through a door, such as a raised step or threshold.  Trim any bushes or trees on the path to your home.  Use bright outdoor lighting.  Clear any walking paths of anything that might make someone trip, such as rocks or tools.  Regularly check to see if handrails are loose or broken. Make sure that both sides of any steps have handrails.  Any raised decks and porches should have guardrails on the edges.  Have any leaves, snow, or ice cleared regularly.  Use sand or salt on walking paths during winter.  Clean up any spills in your garage right away. This includes oil or grease spills. What can I do in the bathroom?  Use night lights.  Install grab bars by the toilet and in the tub and shower. Do not use towel bars as grab bars.  Use non-skid mats or decals in the tub or shower.  If you need to sit down in the shower, use a plastic, non-slip stool.  Keep the floor dry. Clean up any water that spills on the floor as soon as it  happens.  Remove soap buildup in the tub or shower regularly.  Attach bath mats securely with double-sided non-slip rug tape.  Do not have throw rugs and other things on the floor that can make you trip. What can I do in the bedroom?  Use night lights.  Make sure that you have a light by your bed that is easy to reach.  Do not use any sheets or blankets that are too big for your bed. They should not hang down onto the floor.  Have a firm chair that has side arms. You can use this for support while you get dressed.  Do not have throw rugs and other things on the floor that can make you trip. What can I do in the kitchen?  Clean up any spills right away.  Avoid walking on wet floors.  Keep items that you use a lot in easy-to-reach places.  If you need to reach something above you, use a strong step stool that has a grab bar.  Keep electrical cords out of the way.  Do not use floor polish or wax that makes floors slippery. If you must use wax, use non-skid floor wax.  Do not have throw rugs and other things on the floor that can make  you trip. What can I do with my stairs?  Do not leave any items on the stairs.  Make sure that there are handrails on both sides of the stairs and use them. Fix handrails that are broken or loose. Make sure that handrails are as long as the stairways.  Check any carpeting to make sure that it is firmly attached to the stairs. Fix any carpet that is loose or worn.  Avoid having throw rugs at the top or bottom of the stairs. If you do have throw rugs, attach them to the floor with carpet tape.  Make sure that you have a light switch at the top of the stairs and the bottom of the stairs. If you do not have them, ask someone to add them for you. What else can I do to help prevent falls?  Wear shoes that:  Do not have high heels.  Have rubber bottoms.  Are comfortable and fit you well.  Are closed at the toe. Do not wear sandals.  If you  use a stepladder:  Make sure that it is fully opened. Do not climb a closed stepladder.  Make sure that both sides of the stepladder are locked into place.  Ask someone to hold it for you, if possible.  Clearly mark and make sure that you can see:  Any grab bars or handrails.  First and last steps.  Where the edge of each step is.  Use tools that help you move around (mobility aids) if they are needed. These include:  Canes.  Walkers.  Scooters.  Crutches.  Turn on the lights when you go into a dark area. Replace any light bulbs as soon as they burn out.  Set up your furniture so you have a clear path. Avoid moving your furniture around.  If any of your floors are uneven, fix them.  If there are any pets around you, be aware of where they are.  Review your medicines with your doctor. Some medicines can make you feel dizzy. This can increase your chance of falling. Ask your doctor what other things that you can do to help prevent falls. This information is not intended to replace advice given to you by your health care provider. Make sure you discuss any questions you have with your health care provider. Document Released: 04/03/2009 Document Revised: 11/13/2015 Document Reviewed: 07/12/2014 Elsevier Interactive Patient Education  2017 [employer].

## 2019-12-12 NOTE — Progress Notes (Signed)
This visit occurred during the SARS-CoV-2 public health emergency.  Safety protocols were in place, including screening questions prior to the visit, additional usage of staff PPE, and extensive cleaning of exam room while observing appropriate contact time as indicated for disinfecting solutions.  Subjective:     Patient ID: Betty Arroyo , female    DOB: Sep 14, 1945 , 74 y.o.   MRN: [id number]   Chief Complaint  Patient presents with  . Annual Exam  . Diabetes  . Hypertension    HPI  She is here today for a full physical examination.  She denies having any specific concerns at this time.   Diabetes She presents for her follow-up diabetic visit. She has type 2 diabetes mellitus. Her disease course has been stable. There are no hypoglycemic associated symptoms. Pertinent negatives for diabetes include no blurred vision, no chest pain and no fatigue. There are no hypoglycemic complications. Diabetic complications include nephropathy. Risk factors for coronary artery disease include diabetes mellitus, dyslipidemia, hypertension and post-menopausal. Current diabetic treatment includes oral agent (monotherapy). Her weight is stable. She is following a diabetic diet. She participates in exercise intermittently. Her breakfast blood glucose is taken between 8-9 am. Her breakfast blood glucose range is generally 90-110 mg/dl. Her dinner blood glucose is taken between 5-6 pm. Her dinner blood glucose range is generally 130-140 mg/dl. An ACE inhibitor/angiotensin II receptor blocker is being taken. Eye exam is current.  Hypertension This is a chronic problem. The current episode started more than 1 year ago. The problem has been gradually improving since onset. The problem is controlled. Pertinent negatives include no blurred vision or chest pain. Past treatments include ACE inhibitors, diuretics and angiotensin blockers.     Past Medical History:  Diagnosis Date  . Arthritis   . Chronic kidney  disease   . Diabetes mellitus without complication ([place])   . Hypercholesteremia   . Hypertension   . 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 Vertigo, benign paroxysmal      Family History  Problem Relation Age of Onset  . Diabetes Mother   . Hyperlipidemia Mother   . Hypertension Mother   . Varicose Veins Mother   . Hyperlipidemia Brother   . Hypertension Brother      Current Outpatient Medications:  .  acetaminophen (TYLENOL) 500 MG tablet, Take 500 mg by mouth every 8 (eight) hours as needed for moderate pain., Disp: , Rfl:  .  amitriptyline (ELAVIL) 25 MG tablet, Take 25 mg by mouth at bedtime., Disp: , Rfl:  .  carvedilol (COREG) 6.25 MG tablet, TAKE 2 TABLETS BY MOUTH TWICE DAILY WITH FOOD, Disp: 180 tablet, Rfl: 0 .  Chlorphen-PE-Acetaminophen (NOREL AD) 4-10-325 MG TABS, Take 1 tablet by mouth 2 times per day, Disp: 20 tablet, Rfl: 0 .  Cholecalciferol (VITAMIN D3) 50 MCG (2000 UT) capsule, Take 2,000 Units by mouth daily., Disp: , Rfl:  .  JANUVIA 100 MG tablet, Take 100 mg by mouth every Monday, Wednesday, and Friday. , Disp: , Rfl:  .  meclizine (ANTIVERT) 25 MG tablet, Take 1 tablet (25 mg total) by mouth 3 (three) times daily as needed for dizziness., Disp: 30 tablet, Rfl: 0 .  rosuvastatin (CRESTOR) 40 MG tablet, Take 1 tablet (40 mg total) by mouth daily., Disp: 90 tablet, Rfl: 1 .  vitamin C (ASCORBIC ACID) 500 MG tablet, Take 500 mg by mouth daily., Disp: , Rfl:  .  diclofenac (VOLTAREN) 75 MG EC tablet, Take  75 mg by mouth 2 (two) times daily. (Patient not taking: Reported on 12/11/2019), Disp: , Rfl:  .  docusate sodium (COLACE) 100 MG capsule, Take 1 capsule (100 mg total) by mouth 2 (two) times daily. (Patient not taking: Reported on 04/11/2019), Disp: 30 capsule, Rfl: 0   No Known Allergies    The patient states she uses status post hysterectomy for birth control. Last LMP was No LMP recorded. Patient has had a hysterectomy.. Negative for Dysmenorrhea  Negative for: breast discharge, breast lump(s), breast pain and breast self exam. Associated symptoms include abnormal vaginal bleeding. Pertinent negatives include abnormal bleeding (hematology), anxiety, decreased libido, depression, difficulty falling sleep, dyspareunia, history of infertility, nocturia, sexual dysfunction, sleep disturbances, urinary incontinence, urinary urgency, vaginal discharge and vaginal itching. Diet regular.The patient states her exercise level is  intermittent.   . The patient's tobacco use is:  Social History   Tobacco Use  Smoking Status Never Smoker  Smokeless Tobacco Never Used  . She has been exposed to passive smoke. The patient's alcohol use is:  Social History   Substance and Sexual Activity  Alcohol Use No    Review of Systems  Constitutional: Negative.  Negative for fatigue.  HENT: Negative.   Eyes: Negative.  Negative for blurred vision.  Respiratory: Negative.   Cardiovascular: Negative.  Negative for chest pain.  Endocrine: Negative.   Genitourinary: Negative.   Musculoskeletal: Negative.   Skin: Negative.   Allergic/Immunologic: Negative.   Neurological: Negative.   Hematological: Negative.   Psychiatric/Behavioral: Negative.      Today's Vitals   12/12/19 0956  BP: 124/72  Pulse: 64  Temp: 97.6 F (36.4 C)  TempSrc: Oral  Weight: 160 lb (72.6 kg)  Height: 4' 11.6" (1.514 m)  PainSc: 0-No pain   Body mass index is 31.67 kg/m.   Objective:  Physical Exam Vitals and nursing note reviewed.  Constitutional:      Appearance: Normal appearance.  HENT:     Head: Normocephalic and atraumatic.     Right Ear: Tympanic membrane, ear canal and external ear normal.     Left Ear: Tympanic membrane, ear canal and external ear normal.     Nose:     Comments: Deferred, masked    Mouth/Throat:     Comments: Deferred, masked Eyes:     Extraocular Movements: Extraocular movements intact.     Conjunctiva/sclera: Conjunctivae normal.      Pupils: Pupils are equal, round, and reactive to light.  Cardiovascular:     Rate and Rhythm: Normal rate and regular rhythm.     Pulses: Normal pulses.          Dorsalis pedis pulses are 2+ on the right side and 2+ on the left side.     Heart sounds: Normal heart sounds.  Pulmonary:     Effort: Pulmonary effort is normal.  Chest:     Breasts: Tanner Score is 5.        Right: Normal.        Left: Normal.  Abdominal:     General: Abdomen is flat. Bowel sounds are normal.     Palpations: Abdomen is soft.  Genitourinary:    Comments: deferred Musculoskeletal:        General: Normal range of motion.     Cervical back: Normal range of motion and neck supple.  Feet:     Right foot:     Protective Sensation: 5 sites tested. 5 sites sensed.     Skin integrity:  Skin integrity normal.     Toenail Condition: Right toenails are normal.     Left foot:     Protective Sensation: 5 sites tested. 5 sites sensed.     Skin integrity: Skin integrity normal.     Toenail Condition: Left toenails are normal.  Skin:    General: Skin is warm and dry.  Neurological:     General: No focal deficit present.     Mental Status: She is alert and oriented to person, place, and time.  Psychiatric:        Mood and Affect: Mood normal.        Behavior: Behavior normal.         Assessment And Plan:     1. Encounter for annual physical exam  A full exam was performed. Importance of monthly self breast exams was discussed with the patient. PATIENT IS ADVISED TO GET 30-45 MINUTES REGULAR EXERCISE NO LESS THAN FOUR TO FIVE DAYS PER WEEK - BOTH WEIGHTBEARING EXERCISES AND AEROBIC ARE RECOMMENDED.  SHE IS ADVISED TO FOLLOW A HEALTHY DIET WITH AT LEAST SIX FRUITS/VEGGIES PER DAY, DECREASE INTAKE OF RED MEAT, AND TO INCREASE FISH INTAKE TO TWO DAYS PER WEEK.  MEATS/FISH SHOULD NOT BE FRIED, BAKED OR BROILED IS PREFERABLE.  I SUGGEST WEARING SPF 50 SUNSCREEN ON EXPOSED PARTS AND ESPECIALLY WHEN IN THE DIRECT  SUNLIGHT FOR AN EXTENDED PERIOD OF TIME.  PLEASE AVOID FAST FOOD RESTAURANTS AND INCREASE YOUR WATER INTAKE.   2. Type 2 diabetes mellitus with stage 3 chronic kidney disease, without long-term current use of insulin, unspecified whether stage 3a or 3b CKD ([place])  Diabetic foot exam was performed.  I DISCUSSED WITH THE PATIENT AT LENGTH REGARDING THE GOALS OF GLYCEMIC CONTROL AND POSSIBLE LONG-TERM COMPLICATIONS.  I  ALSO STRESSED THE IMPORTANCE OF COMPLIANCE WITH HOME GLUCOSE MONITORING, DIETARY RESTRICTIONS INCLUDING AVOIDANCE OF SUGARY DRINKS/PROCESSED FOODS,  ALONG WITH REGULAR EXERCISE.  I  ALSO STRESSED THE IMPORTANCE OF ANNUAL EYE EXAMS, SELF FOOT CARE AND COMPLIANCE WITH OFFICE VISITS.  - POCT Urinalysis Dipstick (81002) - POCT UA - Microalbumin  - CBC with Diff - Hemoglobin A1c - Lipid panel - CMP14+EGFR  3. Parenchymal renal hypertension, stage 1 through stage 4 or unspecified chronic kidney disease  Chronic, well controlled. She will continue with current meds.  She is encouraged to avoid adding salt to her foods.  She will rto in six months for re-evaluation. EKG performed, NSR w/o acute changes.   - EKG 12-Lead  Maximino Greenland, MD    THE PATIENT IS ENCOURAGED TO PRACTICE SOCIAL DISTANCING DUE TO THE COVID-19 PANDEMIC.

## 2019-12-12 NOTE — Progress Notes (Signed)
This visit occurred during the SARS-CoV-2 public health emergency.  Safety protocols were in place, including screening questions prior to the visit, additional usage of staff PPE, and extensive cleaning of exam room while observing appropriate contact time as indicated for disinfecting solutions.  Subjective:   Betty Arroyo is a 74 y.o. female who presents for Medicare Annual (Subsequent) preventive examination.  Review of Systems    n/a Cardiac Risk Factors include: advanced age (>37men, >13 women);diabetes mellitus;hypertension;obesity (BMI >30kg/m2)     Objective:    Today's Vitals   12/12/19 0944  BP: 124/72  Pulse: 64  Temp: 97.6 F (36.4 C)  TempSrc: Oral  Weight: 160 lb (72.6 kg)  Height: 4' 11.6" (1.514 m)   Body mass index is 31.67 kg/m.  Advanced Directives 12/12/2019 02/16/2019 02/13/2019 12/06/2018 04/23/2015  Does Patient Have a Medical Advance Directive? Yes Yes Yes Yes Yes  Type of Estate agent of [place];Living will Healthcare Power of [place];Living will - Healthcare Power of [place];Living will Healthcare Power of [place];Living will  Does patient want to make changes to medical advance directive? - No - Patient declined - - No - Patient declined  Copy of Healthcare Power of Attorney in Chart? No - copy requested No - copy requested - No - copy requested No - copy requested    Current Medications (verified) Outpatient Encounter Medications as of 12/12/2019  Medication Sig  . acetaminophen (TYLENOL) 500 MG tablet Take 500 mg by mouth every 8 (eight) hours as needed for moderate pain.  Marland Kitchen amitriptyline (ELAVIL) 25 MG tablet Take 25 mg by mouth at bedtime.  . carvedilol (COREG) 6.25 MG tablet TAKE 2 TABLETS BY MOUTH TWICE DAILY WITH FOOD  . Chlorphen-PE-Acetaminophen (NOREL AD) 4-10-325 MG TABS Take 1 tablet by mouth 2 times per day  . Cholecalciferol (VITAMIN D3) 50 MCG (2000 UT) capsule Take 2,000 Units by mouth daily.  Marland Kitchen JANUVIA 100  MG tablet Take 100 mg by mouth every Monday, Wednesday, and Friday.   . meclizine (ANTIVERT) 25 MG tablet Take 1 tablet (25 mg total) by mouth 3 (three) times daily as needed for dizziness.  . rosuvastatin (CRESTOR) 40 MG tablet Take 1 tablet (40 mg total) by mouth daily.  . vitamin C (ASCORBIC ACID) 500 MG tablet Take 500 mg by mouth daily.  . diclofenac (VOLTAREN) 75 MG EC tablet Take 75 mg by mouth 2 (two) times daily. (Patient not taking: Reported on 12/11/2019)  . docusate sodium (COLACE) 100 MG capsule Take 1 capsule (100 mg total) by mouth 2 (two) times daily. (Patient not taking: Reported on 04/11/2019)   No facility-administered encounter medications on file as of 12/12/2019.    Allergies (verified) Patient has no known allergies.   History: Past Medical History:  Diagnosis Date  . Arthritis   . Chronic kidney disease   . Diabetes mellitus without complication (HCC)   . Hypercholesteremia   . Hypertension   . 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 Vertigo, benign paroxysmal    Past Surgical History:  Procedure Laterality Date  . ABDOMINAL HYSTERECTOMY    . COLONOSCOPY  2019  . TONSILLECTOMY  1984  . 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 Diabetes Mother   . Hyperlipidemia Mother   . Hypertension Mother   .  Varicose Veins Mother   . Hyperlipidemia Brother   . Hypertension Brother    Social History   Socioeconomic History  . Marital status: Married    Spouse name: Not on file  . Number of children: Not on file  . Years of education: Not on file  . Highest education level: Not on file  Occupational History  . Occupation: retired  Tobacco Use  . Smoking status: Never Smoker  . Smokeless tobacco: Never Used  Vaping Use  . Vaping Use: Never used  Substance and Sexual Activity    . Alcohol use: No  . Drug use: No  . Sexual activity: Yes    Birth control/protection: Post-menopausal  Other Topics Concern  . Not on file  Social History Narrative  . Not on file   Social Determinants of Health   Financial Resource Strain: Low Risk   . Difficulty of Paying Living Expenses: Not hard at all  Food Insecurity: No Food Insecurity  . Worried About Programme researcher, broadcasting/film/video in the Last Year: Never true  . Ran Out of Food in the Last Year: Never true  Transportation Needs: No Transportation Needs  . Lack of Transportation (Medical): No  . Lack of Transportation (Non-Medical): No  Physical Activity: Insufficiently Active  . Days of Exercise per Week: 3 days  . Minutes of Exercise per Session: 30 min  Stress: No Stress Concern Present  . Feeling of Stress : Not at all  Social Connections:   . Frequency of Communication with Friends and Family:   . Frequency of Social Gatherings with Friends and Family:   . Attends Religious Services:   . Active Member of Clubs or Organizations:   . Attends Banker Meetings:   Marland Kitchen Marital Status:     Tobacco Counseling Counseling given: Not Answered   Clinical Intake:  Pre-visit preparation completed: No  Pain : No/denies pain     Nutritional Status: BMI > 30  Obese Nutritional Risks: None Diabetes: Yes CBG done?: No Did pt. bring in CBG monitor from home?: No  How often do you need to have someone help you when you read instructions, pamphlets, or other written materials from your doctor or pharmacy?: 1 - Never  Diabetic?yes Nutrition Risk Assessment:  Has the patient had any N/V/D within the last 2 months?  No  Does the patient have any non-healing wounds?  No  Has the patient had any unintentional weight loss or weight gain?  No   Diabetes:  Is the patient diabetic?  Yes  If diabetic, was a CBG obtained today?  No  Did the patient bring in their glucometer from home?  No  How often do you monitor your  CBG's? Once weekly.   Financial Strains and Diabetes Management:  Are you having any financial strains with the device, your supplies or your medication? No .  Does the patient want to be seen by Chronic Care Management for management of their diabetes?  No  Would the patient like to be referred to a Nutritionist or for Diabetic Management?  No   Diabetic Exams:  Diabetic Eye Exam: Completed 12/10/2019 Diabetic Foot Exam: Overdue, Pt has been advised about the importance in completing this exam. Pt is scheduled for diabetic foot exam on 12/12/2019.   Interpreter Needed?: No  Information entered by :: Master's degree   Activities of Daily Living In your present state of health, do you have any difficulty performing the following activities: 12/12/2019 02/16/2019  Hearing?  N N  Vision? N N  Difficulty concentrating or making decisions? N N  Walking or climbing stairs? N N  Dressing or bathing? N N  Doing errands, shopping? N N  Preparing Food and eating ? N -  Using the Toilet? N -  In the past six months, have you accidently leaked urine? N -  Do you have problems with loss of bowel control? N -  Managing your Medications? N -  Managing your Finances? N -  Housekeeping or managing your Housekeeping? N -  Some recent data might be hidden    Patient Care Team: Dorothyann Peng, MD as PCP - General (Internal Medicine)  Indicate any recent Medical Services you may have received from other than [facility] providers in the past year (date may be approximate).     Assessment:   This is a routine wellness examination for [place].  Hearing/Vision screen  Hearing Screening   125Hz  250Hz  500Hz  1000Hz  2000Hz  3000Hz  4000Hz  6000Hz  8000Hz   Right ear:           Left ear:           Vision Screening Comments: Regular eye exams. Dr.  Dietary issues and exercise activities discussed: Current Exercise Habits: Home exercise routine, Type of exercise: calisthenics;walking, Time (Minutes): 30,  Frequency (Times/Week): 3, Weekly Exercise (Minutes/Week): 90  Goals    .  I would like to manage my diabetes (pt-stated)      Current Barriers:  . Diabetes: T2DM; complicated by chronic medical conditions including HTN, HLD, most recent A1c 6.4% . Current antihyperglycemic regimen: Januvia 100mg  o Will attempt patient assistance for Januvia with [employer] . Denies hypoglycemic symptoms; denies hyperglycemic symptoms . Current exercise: n/a . Current blood glucose readings: FBG 90-110 . Cardiovascular risk reduction: o Current hypertensive regimen: coreg - Discuss ACEi/ARB o Current hyperlipidemia regimen: rosuvastatin 40mg  qhs o Current antiplatelet regimen: n/a  Pharmacist Clinical Goal(s):  Over the next 90 days, patient will work with PharmD and primary care provider to address needs related to diabetes management  Interventions: . Comprehensive medication review performed, medication list updated in electronic medical record  Patient Self Care Activities:  . Patient will check blood glucose daily, document, and provide at future appointments . Patient will focus on medication adherence . Patient will take medications as prescribed . Patient will contact provider with any episodes of hypoglycemia . Patient will report any questions or concerns to provider   Initial goal documentation     .  Patient Stated      Wants to get back to exercising    .  Patient Stated      12/12/2019, wants to get off januvia      Depression Screen PHQ 2/9 Scores 12/12/2019 08/14/2019 01/25/2019 12/06/2018 07/24/2018  PHQ - 2 Score 0 0 0 0 0  PHQ- 9 Score - - - 0 -    Fall Risk Fall Risk  12/12/2019 01/25/2019 12/06/2018 07/24/2018  Falls in the past year? 0 0 0 0  Risk for fall due to : Medication side effect - Medication side effect -  Follow up Falls evaluation completed;Education provided;Falls prevention discussed - Education provided;Falls prevention discussed -    Any stairs in or around the  home? Yes  If so, are there any without handrails? Yes  Home free of loose throw rugs in walkways, pet beds, electrical cords, etc? Yes  Adequate lighting in your home to reduce risk of falls? Yes   ASSISTIVE DEVICES  UTILIZED TO PREVENT FALLS:  Life alert? No  Use of a cane, walker or w/c? No  Grab bars in the bathroom? Yes  Shower chair or bench in shower? Yes  Elevated toilet seat or a handicapped toilet? No   TIMED UP AND GO:  Was the test performed? No .  .   Gait steady and fast without use of assistive device  Cognitive Function:     6CIT Screen 12/12/2019 12/06/2018  What Year? 0 points 0 points  What month? 0 points 0 points  What time? 0 points 0 points  Count back from 20 2 points 0 points  Months in reverse 0 points 0 points  Repeat phrase 2 points 0 points  Total Score 4 0    Immunizations Immunization History  Administered Date(s) Administered  . Influenza, High Dose Seasonal PF 07/24/2018, 04/24/2019  . Influenza-Unspecified 04/24/2019  . PFIZER SARS-COV-2 Vaccination 07/28/2019, 08/18/2019  . Pneumococcal Conjugate-13 04/24/2019  . Pneumococcal Polysaccharide-23 10/07/2014    TDAP status: Up to date Flu Vaccine status: Up to date Pneumococcal vaccine status: Up to date Covid-19 vaccine status: Completed vaccines  Qualifies for Shingles Vaccine? Yes   Zostavax completed No   Shingrix Completed?: No.    Education has been provided regarding the importance of this vaccine. Patient has been advised to call insurance company to determine out of pocket expense if they have not yet received this vaccine. Advised may also receive vaccine at local pharmacy or Health Dept. Verbalized acceptance and understanding.  Screening Tests Health Maintenance  Topic Date Due  . FOOT EXAM  12/06/2019  . OPHTHALMOLOGY EXAM  12/11/2019  . INFLUENZA VACCINE  01/20/2020  . HEMOGLOBIN A1C  02/11/2020  . URINE MICROALBUMIN  12/11/2020  . MAMMOGRAM  12/06/2021  .  TETANUS/TDAP  05/04/2022  . COLONOSCOPY  04/14/2028  . DEXA SCAN  Completed  . COVID-19 Vaccine  Completed  . Hepatitis C Screening  Completed  . PNA vac Low Risk Adult  Completed    Health Maintenance  Health Maintenance Due  Topic Date Due  . FOOT EXAM  12/06/2019  . OPHTHALMOLOGY EXAM  12/11/2019    Colorectal cancer screening: Completed 04/14/2018. Repeat every 10 years Mammogram status: Completed 12/07/2019. Repeat every year Bone Density status: Completed 03/16/2017.   Lung Cancer Screening: (Low Dose CT Chest recommended if Age 60-80 years, 30 pack-year currently smoking OR have quit w/in 15years.) does not qualify.   Lung Cancer Screening Referral: no  Additional Screening:  Hepatitis C Screening: does qualify; Completed 09/01/2012  Vision Screening: Recommended annual ophthalmology exams for early detection of glaucoma and other disorders of the eye. Is the patient up to date with their annual eye exam?  Yes  Who is the provider or what is the name of the office in which the patient attends annual eye exams? Dr. Maryjane Hurter If pt is not established with a provider, would they like to be referred to a provider to establish care? No .   Dental Screening: Recommended annual dental exams for proper oral hygiene  Community Resource Referral / Chronic Care Management: CRR required this visit?  No   CCM required this visit?  No      Plan:     I have personally reviewed and noted the following in the patient's chart:   . Medical and social history . Use of alcohol, tobacco or illicit drugs  . Current medications and supplements . Functional ability and status . Nutritional status . Physical activity .  Advanced directives . List of other physicians . Hospitalizations, surgeries, and ER visits in previous 12 months . Vitals . Screenings to include cognitive, depression, and falls . Referrals and appointments  In addition, I have reviewed and discussed with patient  certain preventive protocols, quality metrics, and best practice recommendations. A written personalized care plan for preventive services as well as general preventive health recommendations were provided to patient.     Barb Merinoickeah E Reshad Saab, LPN   1/61/09606/23/2021   Nurse Notes: Patient is working to get off Januvia.

## 2019-12-12 NOTE — Patient Instructions (Signed)
Health Maintenance, Female Adopting a healthy lifestyle and getting preventive care are important in promoting health and wellness. Ask your health care provider about:  The right schedule for you to have regular tests and exams.  Things you can do on your own to prevent diseases and keep yourself healthy. What should I know about diet, weight, and exercise? Eat a healthy diet   Eat a diet that includes plenty of vegetables, fruits, low-fat dairy products, and lean protein.  Do not eat a lot of foods that are high in solid fats, added sugars, or sodium. Maintain a healthy weight Body mass index (BMI) is used to identify weight problems. It estimates body fat based on height and weight. Your health care provider can help determine your BMI and help you achieve or maintain a healthy weight. Get regular exercise Get regular exercise. This is one of the most important things you can do for your health. Most adults should:  Exercise for at least 150 minutes each week. The exercise should increase your heart rate and make you sweat (moderate-intensity exercise).  Do strengthening exercises at least twice a week. This is in addition to the moderate-intensity exercise.  Spend less time sitting. Even light physical activity can be beneficial. Watch cholesterol and blood lipids Have your blood tested for lipids and cholesterol at 72 years of age, then have this test every 5 years. Have your cholesterol levels checked more often if:  Your lipid or cholesterol levels are high.  You are older than 72 years of age.  You are at high risk for heart disease. What should I know about cancer screening? Depending on your health history and family history, you may need to have cancer screening at various ages. This may include screening for:  Breast cancer.  Cervical cancer.  Colorectal cancer.  Skin cancer.  Lung cancer. What should I know about heart disease, diabetes, and high blood  pressure? Blood pressure and heart disease  High blood pressure causes heart disease and increases the risk of stroke. This is more likely to develop in people who have high blood pressure readings, are of African descent, or are overweight.  Have your blood pressure checked: ? Every 3-5 years if you are 18-39 years of age. ? Every year if you are 40 years old or older. Diabetes Have regular diabetes screenings. This checks your fasting blood sugar level. Have the screening done:  Once every three years after age 40 if you are at a normal weight and have a low risk for diabetes.  More often and at a younger age if you are overweight or have a high risk for diabetes. What should I know about preventing infection? Hepatitis B If you have a higher risk for hepatitis B, you should be screened for this virus. Talk with your health care provider to find out if you are at risk for hepatitis B infection. Hepatitis C Testing is recommended for:  Everyone born from 1945 through 1965.  Anyone with known risk factors for hepatitis C. Sexually transmitted infections (STIs)  Get screened for STIs, including gonorrhea and chlamydia, if: ? You are sexually active and are younger than 72 years of age. ? You are older than 72 years of age and your health care provider tells you that you are at risk for this type of infection. ? Your sexual activity has changed since you were last screened, and you are at increased risk for chlamydia or gonorrhea. Ask your health care provider if   you are at risk.  Ask your health care provider about whether you are at high risk for HIV. Your health care provider may recommend a prescription medicine to help prevent HIV infection. If you choose to take medicine to prevent HIV, you should first get tested for HIV. You should then be tested every 3 months for as long as you are taking the medicine. Pregnancy  If you are about to stop having your period (premenopausal) and  you may become pregnant, seek counseling before you get pregnant.  Take 400 to 800 micrograms (mcg) of folic acid every day if you become pregnant.  Ask for birth control (contraception) if you want to prevent pregnancy. Osteoporosis and menopause Osteoporosis is a disease in which the bones lose minerals and strength with aging. This can result in bone fractures. If you are 65 years old or older, or if you are at risk for osteoporosis and fractures, ask your health care provider if you should:  Be screened for bone loss.  Take a calcium or vitamin D supplement to lower your risk of fractures.  Be given hormone replacement therapy (HRT) to treat symptoms of menopause. Follow these instructions at home: Lifestyle  Do not use any products that contain nicotine or tobacco, such as cigarettes, e-cigarettes, and chewing tobacco. If you need help quitting, ask your health care provider.  Do not use street drugs.  Do not share needles.  Ask your health care provider for help if you need support or information about quitting drugs. Alcohol use  Do not drink alcohol if: ? Your health care provider tells you not to drink. ? You are pregnant, may be pregnant, or are planning to become pregnant.  If you drink alcohol: ? Limit how much you use to 0-1 drink a day. ? Limit intake if you are breastfeeding.  Be aware of how much alcohol is in your drink. In the [place] one drink equals one 12 oz bottle of beer (355 mL), one 5 oz glass of wine (148 mL), or one 1 oz glass of hard liquor (44 mL). General instructions  Schedule regular health, dental, and eye exams.  Stay current with your vaccines.  Tell your health care provider if: ? You often feel depressed. ? You have ever been abused or do not feel safe at home. Summary  Adopting a healthy lifestyle and getting preventive care are important in promoting health and wellness.  Follow your health care provider's instructions about healthy  diet, exercising, and getting tested or screened for diseases.  Follow your health care provider's instructions on monitoring your cholesterol and blood pressure. This information is not intended to replace advice given to you by your health care provider. Make sure you discuss any questions you have with your health care provider. Document Revised: 05/31/2018 Document Reviewed: 05/31/2018 Elsevier Patient Education  2020 [employer]  

## 2019-12-13 ENCOUNTER — Telehealth: Payer: Self-pay

## 2019-12-13 LAB — LIPID PANEL
Chol/HDL Ratio: 4.6 ratio — ABNORMAL HIGH (ref 0.0–4.4)
Cholesterol, Total: 227 mg/dL — ABNORMAL HIGH (ref 100–199)
HDL: 49 mg/dL (ref >39)
LDL Chol Calc (NIH): 156 mg/dL — ABNORMAL HIGH (ref 0–99)
Triglycerides: 124 mg/dL (ref 0–149)
VLDL Cholesterol Cal: 22 mg/dL (ref 5–40)

## 2019-12-13 LAB — CMP14+EGFR
ALT: 12 IU/L (ref 0–32)
AST: 17 IU/L (ref 0–40)
Albumin/Globulin Ratio: 1.8 (ref 1.2–2.2)
Albumin: 4.5 g/dL (ref 3.7–4.7)
Alkaline Phosphatase: 123 IU/L — ABNORMAL HIGH (ref 48–121)
BUN/Creatinine Ratio: 15 (ref 12–28)
BUN: 17 mg/dL (ref 8–27)
Bilirubin Total: 0.4 mg/dL (ref 0.0–1.2)
CO2: 24 mmol/L (ref 20–29)
Calcium: 10 mg/dL (ref 8.7–10.3)
Chloride: 103 mmol/L (ref 96–106)
Creatinine, Ser: 1.12 mg/dL — ABNORMAL HIGH (ref 0.57–1.00)
GFR calc Af Amer: 57 mL/min/{1.73_m2} — ABNORMAL LOW (ref >59)
GFR calc non Af Amer: 49 mL/min/{1.73_m2} — ABNORMAL LOW (ref >59)
Globulin, Total: 2.5 g/dL (ref 1.5–4.5)
Glucose: 90 mg/dL (ref 65–99)
Potassium: 4.1 mmol/L (ref 3.5–5.2)
Sodium: 145 mmol/L — ABNORMAL HIGH (ref 134–144)
Total Protein: 7 g/dL (ref 6.0–8.5)

## 2019-12-13 LAB — CBC WITH DIFFERENTIAL/PLATELET
Basophils Absolute: 0 10*3/uL (ref 0.0–0.2)
Basos: 1 %
EOS (ABSOLUTE): 0.1 10*3/uL (ref 0.0–0.4)
Eos: 2 %
Hematocrit: 46.9 % — ABNORMAL HIGH (ref 34.0–46.6)
Hemoglobin: 15.3 g/dL (ref 11.1–15.9)
Immature Grans (Abs): 0 10*3/uL (ref 0.0–0.1)
Immature Granulocytes: 0 %
Lymphocytes Absolute: 1.6 10*3/uL (ref 0.7–3.1)
Lymphs: 28 %
MCH: 29.1 pg (ref 26.6–33.0)
MCHC: 32.6 g/dL (ref 31.5–35.7)
MCV: 89 fL (ref 79–97)
Monocytes Absolute: 0.5 10*3/uL (ref 0.1–0.9)
Monocytes: 9 %
Neutrophils Absolute: 3.4 10*3/uL (ref 1.4–7.0)
Neutrophils: 60 %
Platelets: 221 10*3/uL (ref 150–450)
RBC: 5.25 x10E6/uL (ref 3.77–5.28)
RDW: 13.8 % (ref 11.7–15.4)
WBC: 5.6 10*3/uL (ref 3.4–10.8)

## 2019-12-13 LAB — HEMOGLOBIN A1C
Est. average glucose Bld gHb Est-mCnc: 140 mg/dL
Hgb A1c MFr Bld: 6.5 % — ABNORMAL HIGH (ref 4.8–5.6)

## 2019-12-13 NOTE — Telephone Encounter (Signed)
The patient was notified that her urine results showed blood in her urine and that Dr. Allyne Gee wants the pt to recheck her urine in 2 weeks.

## 2019-12-18 ENCOUNTER — Other Ambulatory Visit: Payer: Self-pay

## 2019-12-20 ENCOUNTER — Other Ambulatory Visit: Payer: Self-pay

## 2019-12-20 MED ORDER — EZETIMIBE 10 MG PO TABS: 10.0000 mg | ORAL_TABLET | Freq: Every day | ORAL | 1 refills | Status: DC

## 2019-12-26 ENCOUNTER — Other Ambulatory Visit: Payer: Self-pay

## 2019-12-26 ENCOUNTER — Other Ambulatory Visit: Payer: Medicare PPO

## 2019-12-27 ENCOUNTER — Ambulatory Visit: Payer: Medicare PPO | Admitting: Internal Medicine

## 2019-12-27 ENCOUNTER — Ambulatory Visit: Payer: Medicare PPO | Admitting: Nurse Practitioner

## 2020-01-14 ENCOUNTER — Other Ambulatory Visit: Payer: Self-pay

## 2020-01-14 MED ORDER — CARVEDILOL 6.25 MG PO TABS: ORAL_TABLET | ORAL | 2 refills | Status: DC

## 2020-01-14 MED ORDER — ROSUVASTATIN CALCIUM 40 MG PO TABS: 40.0000 mg | ORAL_TABLET | Freq: Every day | ORAL | 2 refills | Status: DC

## 2020-03-03 ENCOUNTER — Other Ambulatory Visit: Payer: Self-pay | Admitting: Internal Medicine

## 2020-03-08 ENCOUNTER — Other Ambulatory Visit: Payer: Self-pay | Admitting: Internal Medicine

## 2020-03-31 ENCOUNTER — Telehealth: Payer: Self-pay

## 2020-03-31 NOTE — Telephone Encounter (Signed)
Pt notified that her samples of Januvia is ready for pickup.

## 2020-04-08 ENCOUNTER — Encounter: Payer: Self-pay | Admitting: Internal Medicine

## 2020-04-08 ENCOUNTER — Ambulatory Visit: Payer: Medicare PPO | Admitting: Internal Medicine

## 2020-04-08 ENCOUNTER — Other Ambulatory Visit: Payer: Self-pay

## 2020-04-08 VITALS — BP 120/76 | HR 76 | Temp 97.6°F | Ht 59.6 in | Wt 164.8 lb

## 2020-04-08 DIAGNOSIS — E6609 Other obesity due to excess calories: Secondary | ICD-10-CM

## 2020-04-08 DIAGNOSIS — N183 Chronic kidney disease, stage 3 unspecified: Secondary | ICD-10-CM | POA: Diagnosis not present

## 2020-04-08 DIAGNOSIS — Z23 Encounter for immunization: Secondary | ICD-10-CM | POA: Diagnosis not present

## 2020-04-08 DIAGNOSIS — Z6832 Body mass index (BMI) 32.0-32.9, adult: Secondary | ICD-10-CM | POA: Diagnosis not present

## 2020-04-08 DIAGNOSIS — E1122 Type 2 diabetes mellitus with diabetic chronic kidney disease: Secondary | ICD-10-CM

## 2020-04-08 DIAGNOSIS — I129 Hypertensive chronic kidney disease with stage 1 through stage 4 chronic kidney disease, or unspecified chronic kidney disease: Secondary | ICD-10-CM | POA: Diagnosis not present

## 2020-04-08 NOTE — Progress Notes (Signed)
I,Katawbba Wiggins,acting as a Education administrator for Maximino Greenland, MD.,have documented all relevant documentation on the behalf of Maximino Greenland, MD,as directed by  Maximino Greenland, MD while in the presence of Maximino Greenland, MD.  This visit occurred during the SARS-CoV-2 public health emergency.  Safety protocols were in place, including screening questions prior to the visit, additional usage of staff PPE, and extensive cleaning of exam room while observing appropriate contact time as indicated for disinfecting solutions.  Subjective:     Patient ID: Betty Arroyo , female    DOB: [birth year] , 74 y.o.   MRN: [id number]   Chief Complaint  Patient presents with  . Diabetes  . Hypertension    HPI  The patient is here today for a follow-up on her diabetes and blood pressure.  She reports compliance with meds. She is currently taking Januvia $RemoveBefore'100mg'AndfRCAPAhrTE$  MWF dosing due to decreased renal function.   Diabetes She presents for her follow-up diabetic visit. She has type 2 diabetes mellitus. Her disease course has been stable. Hypoglycemia symptoms include dizziness and headaches. Pertinent negatives for diabetes include no blurred vision, no chest pain and no fatigue. There are no hypoglycemic complications. Diabetic complications include nephropathy. Risk factors for coronary artery disease include diabetes mellitus, dyslipidemia, hypertension and post-menopausal. Her weight is stable. Her breakfast blood glucose is taken between 8-9 am. Her breakfast blood glucose range is generally 90-110 mg/dl. Her dinner blood glucose is taken between 5-6 pm. Her dinner blood glucose range is generally 130-140 mg/dl. An ACE inhibitor/angiotensin II receptor blocker is being taken.  Hypertension This is a chronic problem. The current episode started more than 1 year ago. The problem has been gradually improving since onset. The problem is controlled. Associated symptoms include headaches. Pertinent negatives include no  blurred vision or chest pain. The current treatment provides moderate improvement. Compliance problems include exercise.  Hypertensive end-organ damage includes kidney disease.     Past Medical History:  Diagnosis Date  . Arthritis   . Chronic kidney disease   . Diabetes mellitus without complication ([place])   . Hypercholesteremia   . Hypertension   . 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 Vertigo, benign paroxysmal      Family History  Problem Relation Age of Onset  . Diabetes Mother   . Hyperlipidemia Mother   . Hypertension Mother   . Varicose Veins Mother   . Hyperlipidemia Brother   . Hypertension Brother      Current Outpatient Medications:  .  acetaminophen (TYLENOL) 500 MG tablet, Take 500 mg by mouth every 8 (eight) hours as needed for moderate pain., Disp: , Rfl:  .  amitriptyline (ELAVIL) 25 MG tablet, Take 25 mg by mouth at bedtime., Disp: , Rfl:  .  carvedilol (COREG) 6.25 MG tablet, TAKE 2 TABLETS BY MOUTH TWICE DAILY WITH FOOD, Disp: 180 tablet, Rfl: 2 .  Chlorphen-PE-Acetaminophen (NOREL AD) 4-10-325 MG TABS, Take 1 tablet by mouth 2 times per day, Disp: 20 tablet, Rfl: 0 .  Cholecalciferol (VITAMIN D3) 50 MCG (2000 UT) capsule, Take 2,000 Units by mouth daily., Disp: , Rfl:  .  ezetimibe (ZETIA) 10 MG tablet, Take 1 tablet (10 mg total) by mouth daily., Disp: 90 tablet, Rfl: 1 .  JANUVIA 100 MG tablet, Take 100 mg by mouth every Monday, Wednesday, and Friday. , Disp: , Rfl:  .  meclizine (ANTIVERT) 25 MG tablet, Take 1 tablet (25 mg total) by mouth 3 (three)  times daily as needed for dizziness., Disp: 30 tablet, Rfl: 0 .  rosuvastatin (CRESTOR) 40 MG tablet, Take 1 tablet (40 mg total) by mouth daily., Disp: 90 tablet, Rfl: 2 .  vitamin C (ASCORBIC ACID) 500 MG tablet, Take 500 mg by mouth daily., Disp: , Rfl:    No Known Allergies   Review of Systems  Constitutional: Negative.  Negative for fatigue.  Eyes: Negative for blurred vision.  Respiratory:  Negative.   Cardiovascular: Negative.  Negative for chest pain.  Gastrointestinal: Negative.   Neurological: Positive for dizziness and headaches.  Psychiatric/Behavioral: Negative.   All other systems reviewed and are negative.    Today's Vitals   04/08/20 0926  BP: 120/76  Pulse: 76  Temp: 97.6 F (36.4 C)  TempSrc: Oral  Weight: 164 lb 12.8 oz (74.8 kg)  Height: 4' 11.6" (1.514 m)  PainSc: 0-No pain   Body mass index is 32.62 kg/m.  Wt Readings from Last 3 Encounters:  04/08/20 164 lb 12.8 oz (74.8 kg)  12/12/19 160 lb (72.6 kg)  12/12/19 160 lb (72.6 kg)   Objective:  Physical Exam Vitals and nursing note reviewed.  Constitutional:      Appearance: Normal appearance.  HENT:     Head: Normocephalic and atraumatic.  Cardiovascular:     Rate and Rhythm: Normal rate and regular rhythm.     Heart sounds: Normal heart sounds.  Pulmonary:     Breath sounds: Normal breath sounds.  Skin:    General: Skin is warm.  Neurological:     General: No focal deficit present.     Mental Status: She is alert and oriented to person, place, and time.         Assessment And Plan:     1. Type 2 diabetes mellitus with stage 3 chronic kidney disease, without long-term current use of insulin, unspecified whether stage 3a or 3b CKD ([place]) Comments: Chronic, I will check labs as listed below. I will adjust meds as needed. I plan to decrease Januvia to $RemoveBe'50mg'XnDhiKnVC$  daily once she runs out of current supply.  Last a1c 7.1. - BMP8+EGFR - Hemoglobin A1c  2. Parenchymal renal hypertension, stage 1 through stage 4 or unspecified chronic kidney disease Comments: Chronic, well controlled. She will continue with current meds.   3. Class 1 obesity due to excess calories with serious comorbidity and body mass index (BMI) of 32.0 to 32.9 in adult Comments: She is encouraged to strive for BMI less than 30 to decrease cardiac risk. Encouraged to exercise at least 150 minutes per week.   4. Immunization  due Comments: She was given high dose flu vaccine.  - Flu Vaccine QUAD High Dose(Fluad)    Patient was given opportunity to ask questions. Patient verbalized understanding of the plan and was able to repeat key elements of the plan. All questions were answered to their satisfaction.  Maximino Greenland, MD   I, Maximino Greenland, MD, have reviewed all documentation for this visit. The documentation on 04/09/20 for the exam, diagnosis, procedures, and orders are all accurate and complete.  THE PATIENT IS ENCOURAGED TO PRACTICE SOCIAL DISTANCING DUE TO THE COVID-19 PANDEMIC.

## 2020-04-08 NOTE — Patient Instructions (Signed)
Diabetes Mellitus and Foot Care Foot care is an important part of your health, especially when you have diabetes. Diabetes may cause you to have problems because of poor blood flow (circulation) to your feet and legs, which can cause your skin to:  Become thinner and drier.  Break more easily.  Heal more slowly.  Peel and crack. You may also have nerve damage (neuropathy) in your legs and feet, causing decreased feeling in them. This means that you may not notice minor injuries to your feet that could lead to more serious problems. Noticing and addressing any potential problems early is the best way to prevent future foot problems. How to care for your feet Foot hygiene  Wash your feet daily with warm water and mild soap. Do not use hot water. Then, pat your feet and the areas between your toes until they are completely dry. Do not soak your feet as this can dry your skin.  Trim your toenails straight across. Do not dig under them or around the cuticle. File the edges of your nails with an emery board or nail file.  Apply a moisturizing lotion or petroleum jelly to the skin on your feet and to dry, brittle toenails. Use lotion that does not contain alcohol and is unscented. Do not apply lotion between your toes. Shoes and socks  Wear clean socks or stockings every day. Make sure they are not too tight. Do not wear knee-high stockings since they may decrease blood flow to your legs.  Wear shoes that fit properly and have enough cushioning. Always look in your shoes before you put them on to be sure there are no objects inside.  To break in new shoes, wear them for just a few hours a day. This prevents injuries on your feet. Wounds, scrapes, corns, and calluses  Check your feet daily for blisters, cuts, bruises, sores, and redness. If you cannot see the bottom of your feet, use a mirror or ask someone for help.  Do not cut corns or calluses or try to remove them with medicine.  If you  find a minor scrape, cut, or break in the skin on your feet, keep it and the skin around it clean and dry. You may clean these areas with mild soap and water. Do not clean the area with peroxide, alcohol, or iodine.  If you have a wound, scrape, corn, or callus on your foot, look at it several times a day to make sure it is healing and not infected. Check for: ? Redness, swelling, or pain. ? Fluid or blood. ? Warmth. ? Pus or a bad smell. General instructions  Do not cross your legs. This may decrease blood flow to your feet.  Do not use heating pads or hot water bottles on your feet. They may burn your skin. If you have lost feeling in your feet or legs, you may not know this is happening until it is too late.  Protect your feet from hot and cold by wearing shoes, such as at the beach or on hot pavement.  Schedule a complete foot exam at least once a year (annually) or more often if you have foot problems. If you have foot problems, report any cuts, sores, or bruises to your health care provider immediately. Contact a health care provider if:  You have a medical condition that increases your risk of infection and you have any cuts, sores, or bruises on your feet.  You have an injury that is not   healing.  You have redness on your legs or feet.  You feel burning or tingling in your legs or feet.  You have pain or cramps in your legs and feet.  Your legs or feet are numb.  Your feet always feel cold.  You have pain around a toenail. Get help right away if:  You have a wound, scrape, corn, or callus on your foot and: ? You have pain, swelling, or redness that gets worse. ? You have fluid or blood coming from the wound, scrape, corn, or callus. ? Your wound, scrape, corn, or callus feels warm to the touch. ? You have pus or a bad smell coming from the wound, scrape, corn, or callus. ? You have a fever. ? You have a red line going up your leg. Summary  Check your feet every day  for cuts, sores, red spots, swelling, and blisters.  Moisturize feet and legs daily.  Wear shoes that fit properly and have enough cushioning.  If you have foot problems, report any cuts, sores, or bruises to your health care provider immediately.  Schedule a complete foot exam at least once a year (annually) or more often if you have foot problems. This information is not intended to replace advice given to you by your health care provider. Make sure you discuss any questions you have with your health care provider. Document Revised: 02/28/2019 Document Reviewed: 07/09/2016 Elsevier Patient Education  2020 Elsevier Inc.  

## 2020-04-09 LAB — HEMOGLOBIN A1C
Est. average glucose Bld gHb Est-mCnc: 157 mg/dL
Hgb A1c MFr Bld: 7.1 % — ABNORMAL HIGH (ref 4.8–5.6)

## 2020-04-09 LAB — BMP8+EGFR
BUN/Creatinine Ratio: 14 (ref 12–28)
BUN: 17 mg/dL (ref 8–27)
CO2: 25 mmol/L (ref 20–29)
Calcium: 10 mg/dL (ref 8.7–10.3)
Chloride: 106 mmol/L (ref 96–106)
Creatinine, Ser: 1.24 mg/dL — ABNORMAL HIGH (ref 0.57–1.00)
GFR calc Af Amer: 50 mL/min/{1.73_m2} — ABNORMAL LOW (ref >59)
GFR calc non Af Amer: 43 mL/min/{1.73_m2} — ABNORMAL LOW (ref >59)
Glucose: 140 mg/dL — ABNORMAL HIGH (ref 65–99)
Potassium: 4.3 mmol/L (ref 3.5–5.2)
Sodium: 145 mmol/L — ABNORMAL HIGH (ref 134–144)

## 2020-06-17 DIAGNOSIS — E119 Type 2 diabetes mellitus without complications: Secondary | ICD-10-CM | POA: Diagnosis not present

## 2020-06-17 DIAGNOSIS — H40033 Anatomical narrow angle, bilateral: Secondary | ICD-10-CM | POA: Diagnosis not present

## 2020-06-19 ENCOUNTER — Other Ambulatory Visit: Payer: Self-pay | Admitting: Internal Medicine

## 2020-07-19 ENCOUNTER — Other Ambulatory Visit: Payer: Self-pay | Admitting: Internal Medicine

## 2020-08-07 ENCOUNTER — Ambulatory Visit: Payer: Medicare PPO | Admitting: Internal Medicine

## 2020-09-01 ENCOUNTER — Other Ambulatory Visit: Payer: Self-pay

## 2020-09-01 ENCOUNTER — Encounter: Payer: Self-pay | Admitting: Internal Medicine

## 2020-09-01 ENCOUNTER — Ambulatory Visit (INDEPENDENT_AMBULATORY_CARE_PROVIDER_SITE_OTHER): Payer: Medicare PPO | Admitting: Internal Medicine

## 2020-09-01 VITALS — BP 142/80 | HR 72 | Temp 97.7°F | Wt 166.6 lb

## 2020-09-01 DIAGNOSIS — E1122 Type 2 diabetes mellitus with diabetic chronic kidney disease: Secondary | ICD-10-CM | POA: Diagnosis not present

## 2020-09-01 DIAGNOSIS — N1831 Chronic kidney disease, stage 3a: Secondary | ICD-10-CM | POA: Diagnosis not present

## 2020-09-01 DIAGNOSIS — N183 Chronic kidney disease, stage 3 unspecified: Secondary | ICD-10-CM | POA: Diagnosis not present

## 2020-09-01 DIAGNOSIS — I129 Hypertensive chronic kidney disease with stage 1 through stage 4 chronic kidney disease, or unspecified chronic kidney disease: Secondary | ICD-10-CM

## 2020-09-01 DIAGNOSIS — Z6832 Body mass index (BMI) 32.0-32.9, adult: Secondary | ICD-10-CM

## 2020-09-01 DIAGNOSIS — E6609 Other obesity due to excess calories: Secondary | ICD-10-CM | POA: Diagnosis not present

## 2020-09-01 NOTE — Progress Notes (Signed)
I,Tianna Badgett,acting as a Education administrator for Maximino Greenland, MD.,have documented all relevant documentation on the behalf of Maximino Greenland, MD,as directed by  Maximino Greenland, MD while in the presence of Maximino Greenland, MD.  This visit occurred during the SARS-CoV-2 public health emergency.  Safety protocols were in place, including screening questions prior to the visit, additional usage of staff PPE, and extensive cleaning of exam room while observing appropriate contact time as indicated for disinfecting solutions.  Subjective:     Patient ID: Betty Arroyo , female    DOB: Nov 24, 1945 , 75 y.o.   MRN: [id number]   Chief Complaint  Patient presents with  . Diabetes  . Hypertension    HPI  The patient is here today for a follow-up on her diabetes and blood pressure.  She reports compliance with meds. She denies headaches, chest pain and shortness of breath.  Diabetes She presents for her follow-up diabetic visit. She has type 2 diabetes mellitus. Her disease course has been stable. Pertinent negatives for diabetes include no blurred vision, no chest pain and no fatigue. There are no hypoglycemic complications. Diabetic complications include nephropathy. Risk factors for coronary artery disease include diabetes mellitus, dyslipidemia, hypertension and post-menopausal. Her weight is stable. Her breakfast blood glucose is taken between 8-9 am. Her breakfast blood glucose range is generally 90-110 mg/dl. Her dinner blood glucose is taken between 5-6 pm. Her dinner blood glucose range is generally 130-140 mg/dl. An ACE inhibitor/angiotensin II receptor blocker is being taken.  Hypertension This is a chronic problem. The current episode started more than 1 year ago. The problem has been gradually improving since onset. The problem is controlled. Pertinent negatives include no blurred vision or chest pain. The current treatment provides moderate improvement. Compliance problems include exercise.   Hypertensive end-organ damage includes kidney disease.     Past Medical History:  Diagnosis Date  . Arthritis   . Chronic kidney disease   . Diabetes mellitus without complication ([place])   . Hypercholesteremia   . Hypertension   . 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 Vertigo, benign paroxysmal      Family History  Problem Relation Age of Onset  . Diabetes Mother   . Hyperlipidemia Mother   . Hypertension Mother   . Varicose Veins Mother   . Hyperlipidemia Brother   . Hypertension Brother      Current Outpatient Medications:  .  carvedilol (COREG) 6.25 MG tablet, TAKE 2 TABLETS BY MOUTH TWICE DAILY WITH FOOD, Disp: 180 tablet, Rfl: 2 .  Chlorphen-PE-Acetaminophen (NOREL AD) 4-10-325 MG TABS, Take 1 tablet by mouth 2 times per day, Disp: 20 tablet, Rfl: 0 .  Cholecalciferol (VITAMIN D3) 50 MCG (2000 UT) capsule, Take 2,000 Units by mouth daily., Disp: , Rfl:  .  ezetimibe (ZETIA) 10 MG tablet, Take 1 tablet by mouth once daily, Disp: 90 tablet, Rfl: 0 .  JANUVIA 100 MG tablet, Take 100 mg by mouth every Monday, Wednesday, and Friday. , Disp: , Rfl:  .  rosuvastatin (CRESTOR) 40 MG tablet, Take 1 tablet by mouth once daily, Disp: 90 tablet, Rfl: 0 .  vitamin C (ASCORBIC ACID) 500 MG tablet, Take 500 mg by mouth daily., Disp: , Rfl:  .  acetaminophen (TYLENOL) 500 MG tablet, Take 500 mg by mouth every 8 (eight) hours as needed for moderate pain. (Patient not taking: Reported on 09/01/2020), Disp: , Rfl:  .  amitriptyline (ELAVIL) 25 MG tablet, Take  25 mg by mouth at bedtime. (Patient not taking: Reported on 09/01/2020), Disp: , Rfl:  .  meclizine (ANTIVERT) 25 MG tablet, Take 1 tablet (25 mg total) by mouth 3 (three) times daily as needed for dizziness. (Patient not taking: Reported on 09/01/2020), Disp: 30 tablet, Rfl: 0   No Known Allergies   Review of Systems  Constitutional: Negative.  Negative for fatigue.  Eyes: Negative for blurred vision.  Respiratory: Negative.    Cardiovascular: Negative.  Negative for chest pain.  Gastrointestinal: Negative.   Neurological: Negative.      Today's Vitals   09/01/20 0934  BP: (!) 142/80  Pulse: 72  Temp: 97.7 F (36.5 C)  TempSrc: Oral  Weight: 166 lb 9.6 oz (75.6 kg)  PainSc: 0-No pain   Body mass index is 32.98 kg/m.  Wt Readings from Last 3 Encounters:  09/01/20 166 lb 9.6 oz (75.6 kg)  04/08/20 164 lb 12.8 oz (74.8 kg)  12/12/19 160 lb (72.6 kg)   Objective:  Physical Exam Vitals and nursing note reviewed.  Constitutional:      Appearance: Normal appearance.  HENT:     Head: Normocephalic and atraumatic.     Nose:     Comments: Masked     Mouth/Throat:     Comments: Masked  Eyes:     Extraocular Movements: Extraocular movements intact.  Cardiovascular:     Rate and Rhythm: Normal rate and regular rhythm.     Heart sounds: Normal heart sounds.  Pulmonary:     Effort: Pulmonary effort is normal.     Breath sounds: Normal breath sounds.  Musculoskeletal:     Cervical back: Normal range of motion.  Skin:    General: Skin is warm.  Neurological:     General: No focal deficit present.     Mental Status: She is alert.  Psychiatric:        Mood and Affect: Mood normal.        Behavior: Behavior normal.         Assessment And Plan:     1. Type 2 diabetes mellitus with stage 3a chronic kidney disease, without long-term current use of insulin ([place]) Comments: I will check labs as listed below. She will f/u in June 2022 for her next physical exam.  She agrees to rto sooner should we start [place] $RemoveBe'10mg'GrmNUOugJ$  daily. We will consider starting [place] once she runs out of her current supply of Januvia. Renal ultrasound reviewed in full detail. Unfortunately, she has not had regular f/u with Nephrology. I will refer her back to Nephrology if her GFR drops to stage 3b range. She is encouraged to stay well hydrated.  - Hemoglobin A1c - CMP14+EGFR  2. Parenchymal renal hypertension, stage 1 through  stage 4 or unspecified chronic kidney disease Comments: Chronic, uncontrolled. Admits she is under stress caring for a 73 year old great -nephew. She will c/w current meds for now. She is encouraged to incorporate some stress-relieving techniques into her daily routine.   3. Class 1 obesity due to excess calories with serious comorbidity and body mass index (BMI) of 32.0 to 32.9 in adult Comments: She is encouraged to aim for BMI less than 30 to decreased cardiac risk. Advised to aim for at least 150 minutes of exercise per week.   Patient was given opportunity to ask questions. Patient verbalized understanding of the plan and was able to repeat key elements of the plan. All questions were answered to their satisfaction.   I, Bailey Mech  Shawnie Dapper, MD, have reviewed all documentation for this visit. The documentation on 09/01/20 for the exam, diagnosis, procedures, and orders are all accurate and complete.   IF YOU HAVE BEEN REFERRED TO A SPECIALIST, IT MAY TAKE 1-2 WEEKS TO SCHEDULE/PROCESS THE REFERRAL. IF YOU HAVE NOT HEARD FROM US/SPECIALIST IN TWO WEEKS, PLEASE GIVE [place] A CALL AT [phone].   THE PATIENT IS ENCOURAGED TO PRACTICE SOCIAL DISTANCING DUE TO THE COVID-19 PANDEMIC.

## 2020-09-01 NOTE — Patient Instructions (Signed)
Diabetes Mellitus and Exercise Exercising regularly is important for overall health, especially for people who have diabetes mellitus. Exercising is not only about losing weight. It has many other health benefits, such as increasing muscle strength and bone density and reducing body fat and stress. This leads to improved fitness, flexibility, and endurance, all of which result in better overall health. What are the benefits of exercise if I have diabetes? Exercise has many benefits for people with diabetes. They include:  Helping to lower and control blood sugar (glucose).  Helping the body to respond better to the hormone insulin by improving insulin sensitivity.  Reducing how much insulin the body needs.  Lowering the risk for heart disease by: ? Lowering "bad" cholesterol and triglyceride levels. ? Increasing "good" cholesterol levels. ? Lowering blood pressure. ? Lowering blood glucose levels. What is my activity plan? Your health care provider or certified diabetes educator can help you make a plan for the type and frequency of exercise that works for you. This is called your activity plan. Be sure to:  Get at least 150 minutes of medium-intensity or high-intensity exercise each week. Exercises may include brisk walking, biking, or water aerobics.  Do stretching and strengthening exercises, such as yoga or weight lifting, at least 2 times a week.  Spread out your activity over at least 3 days of the week.  Get some form of physical activity each day. ? Do not go more than 2 days in a row without some kind of physical activity. ? Avoid being inactive for more than 90 minutes at a time. Take frequent breaks to walk or stretch.  Choose exercises or activities that you enjoy. Set realistic goals.  Start slowly and gradually increase your exercise intensity over time.   How do I manage my diabetes during exercise? Monitor your blood glucose  Check your blood glucose before and  after exercising. If your blood glucose is: ? 240 mg/dL (13.3 mmol/L) or higher before you exercise, check your urine for ketones. These are chemicals created by the liver. If you have ketones in your urine, do not exercise until your blood glucose returns to normal. ? 100 mg/dL (5.6 mmol/L) or lower, eat a snack containing 15-20 grams of carbohydrate. Check your blood glucose 15 minutes after the snack to make sure that your glucose level is above 100 mg/dL (5.6 mmol/L) before you start your exercise.  Know the symptoms of low blood glucose (hypoglycemia) and how to treat it. Your risk for hypoglycemia increases during and after exercise. Follow these tips and your health care provider's instructions  Keep a carbohydrate snack that is fast-acting for use before, during, and after exercise to help prevent or treat hypoglycemia.  Avoid injecting insulin into areas of the body that are going to be exercised. For example, avoid injecting insulin into: ? Your arms, when you are about to play tennis. ? Your legs, when you are about to go jogging.  Keep records of your exercise habits. Doing this can help you and your health care provider adjust your diabetes management plan as needed. Write down: ? Food that you eat before and after you exercise. ? Blood glucose levels before and after you exercise. ? The type and amount of exercise you have done.  Work with your health care provider when you start a new exercise or activity. He or she may need to: ? Make sure that the activity is safe for you. ? Adjust your insulin, other medicines, and food that   you eat.  Drink plenty of water while you exercise. This prevents loss of water (dehydration) and problems caused by a lot of heat in the body (heat stroke).   Where to find more information  American Diabetes Association: www.diabetes.org Summary  Exercising regularly is important for overall health, especially for people who have diabetes  mellitus.  Exercising has many health benefits. It increases muscle strength and bone density and reduces body fat and stress. It also lowers and controls blood glucose.  Your health care provider or certified diabetes educator can help you make an activity plan for the type and frequency of exercise that works for you.  Work with your health care provider to make sure any new activity is safe for you. Also work with your health care provider to adjust your insulin, other medicines, and the food you eat. This information is not intended to replace advice given to you by your health care provider. Make sure you discuss any questions you have with your health care provider. Document Revised: 03/05/2019 Document Reviewed: 03/05/2019 Elsevier Patient Education  2021 Elsevier Inc.  

## 2020-09-02 LAB — CMP14+EGFR
ALT: 16 IU/L (ref 0–32)
AST: 20 IU/L (ref 0–40)
Albumin/Globulin Ratio: 1.6 (ref 1.2–2.2)
Albumin: 4.4 g/dL (ref 3.7–4.7)
Alkaline Phosphatase: 119 IU/L (ref 44–121)
BUN/Creatinine Ratio: 17 (ref 12–28)
BUN: 16 mg/dL (ref 8–27)
Bilirubin Total: 0.5 mg/dL (ref 0.0–1.2)
CO2: 21 mmol/L (ref 20–29)
Calcium: 9.5 mg/dL (ref 8.7–10.3)
Chloride: 106 mmol/L (ref 96–106)
Creatinine, Ser: 0.96 mg/dL (ref 0.57–1.00)
Globulin, Total: 2.8 g/dL (ref 1.5–4.5)
Glucose: 169 mg/dL — ABNORMAL HIGH (ref 65–99)
Potassium: 3.9 mmol/L (ref 3.5–5.2)
Sodium: 142 mmol/L (ref 134–144)
Total Protein: 7.2 g/dL (ref 6.0–8.5)
eGFR: 63 mL/min/{1.73_m2} (ref >59)

## 2020-09-02 LAB — HEMOGLOBIN A1C
Est. average glucose Bld gHb Est-mCnc: 169 mg/dL
Hgb A1c MFr Bld: 7.5 % — ABNORMAL HIGH (ref 4.8–5.6)

## 2020-09-05 ENCOUNTER — Other Ambulatory Visit: Payer: Self-pay

## 2020-09-05 ENCOUNTER — Encounter: Payer: Self-pay | Admitting: Internal Medicine

## 2020-09-05 MED ORDER — DAPAGLIFLOZIN PROPANEDIOL 10 MG PO TABS: 10.0000 mg | ORAL_TABLET | Freq: Every day | ORAL | 2 refills | Status: DC

## 2020-09-08 ENCOUNTER — Other Ambulatory Visit: Payer: Self-pay | Admitting: Internal Medicine

## 2020-09-22 ENCOUNTER — Other Ambulatory Visit: Payer: Medicare PPO

## 2020-09-22 ENCOUNTER — Other Ambulatory Visit: Payer: Self-pay

## 2020-09-22 DIAGNOSIS — Z79899 Other long term (current) drug therapy: Secondary | ICD-10-CM | POA: Diagnosis not present

## 2020-09-22 DIAGNOSIS — I129 Hypertensive chronic kidney disease with stage 1 through stage 4 chronic kidney disease, or unspecified chronic kidney disease: Secondary | ICD-10-CM

## 2020-09-22 LAB — CMP14+EGFR
ALT: 20 IU/L (ref 0–32)
AST: 18 IU/L (ref 0–40)
Albumin/Globulin Ratio: 1.7 (ref 1.2–2.2)
Albumin: 4.5 g/dL (ref 3.7–4.7)
Alkaline Phosphatase: 124 IU/L — ABNORMAL HIGH (ref 44–121)
BUN/Creatinine Ratio: 20 (ref 12–28)
BUN: 20 mg/dL (ref 8–27)
Bilirubin Total: 0.5 mg/dL (ref 0.0–1.2)
CO2: 22 mmol/L (ref 20–29)
Calcium: 9.8 mg/dL (ref 8.7–10.3)
Chloride: 106 mmol/L (ref 96–106)
Creatinine, Ser: 1.01 mg/dL — ABNORMAL HIGH (ref 0.57–1.00)
Globulin, Total: 2.6 g/dL (ref 1.5–4.5)
Glucose: 178 mg/dL — ABNORMAL HIGH (ref 65–99)
Potassium: 4.2 mmol/L (ref 3.5–5.2)
Sodium: 146 mmol/L — ABNORMAL HIGH (ref 134–144)
Total Protein: 7.1 g/dL (ref 6.0–8.5)
eGFR: 59 mL/min/{1.73_m2} — ABNORMAL LOW (ref >59)

## 2020-09-23 ENCOUNTER — Other Ambulatory Visit: Payer: Self-pay | Admitting: Internal Medicine

## 2020-10-18 ENCOUNTER — Other Ambulatory Visit: Payer: Self-pay | Admitting: Internal Medicine

## 2020-10-23 ENCOUNTER — Other Ambulatory Visit: Payer: Self-pay | Admitting: Internal Medicine

## 2020-11-11 ENCOUNTER — Telehealth: Payer: Self-pay

## 2020-11-11 NOTE — Telephone Encounter (Signed)
Pt was asked if she would like to speak to the pharmacist about seeing if there is a patient assistance program for Marcelline Deist, the pt said she has tried that and she doesn't qualify for the assistance.

## 2020-11-13 ENCOUNTER — Encounter: Payer: Self-pay | Admitting: Nurse Practitioner

## 2020-11-13 ENCOUNTER — Ambulatory Visit: Payer: Medicare PPO | Admitting: Nurse Practitioner

## 2020-11-13 ENCOUNTER — Telehealth: Payer: Self-pay

## 2020-11-13 ENCOUNTER — Other Ambulatory Visit: Payer: Self-pay

## 2020-11-13 VITALS — BP 120/64 | Temp 97.9°F | Ht 59.0 in | Wt 162.6 lb

## 2020-11-13 DIAGNOSIS — R21 Rash and other nonspecific skin eruption: Secondary | ICD-10-CM | POA: Diagnosis not present

## 2020-11-13 NOTE — Telephone Encounter (Signed)
Left pt vm. Pt left a message stating that she thinks she may be getting allergic reactions from her medication for her diabetes. Red blotches on skin and swelling in certain areas of her body. Pt did say in vm that she did not take the medication on 11/12/20.

## 2020-11-13 NOTE — Progress Notes (Signed)
I,Tianna Badgett,acting as a Neurosurgeon for [employer], NP.,have documented all relevant documentation on the behalf of [employer], NP,as directed by  Charlesetta Ivory, NP while in the presence of Charlesetta Ivory, NP.  This visit occurred during the SARS-CoV-2 public health emergency.  Safety protocols were in place, including screening questions prior to the visit, additional usage of staff PPE, and extensive cleaning of exam room while observing appropriate contact time as indicated for disinfecting solutions.  Subjective:     Patient ID: Betty Arroyo , female    DOB: May 20, 1946 , 75 y.o.   MRN: [id number]   Chief Complaint  Patient presents with  . Skin Discoloration    HPI  Patient is here to be evaluated for discoloration on her skin since starting new medication. She was started on farsiga back in march and states that she noticed small little patches on her skin esp on her legs.  She also has a history of skin problems so was not sure what had caused this. She did not take [place] today. She was on Januvia before and never had any issues with it. She has taken bendyrl and anti-itch cream.     Past Medical History:  Diagnosis Date  . Arthritis   . Chronic kidney disease   . Diabetes mellitus without complication (HCC)   . Hypercholesteremia   . Hypertension   . 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 Vertigo, benign paroxysmal      Family History  Problem Relation Age of Onset  . Diabetes Mother   . Hyperlipidemia Mother   . Hypertension Mother   . Varicose Veins Mother   . Hyperlipidemia Brother   . Hypertension Brother      Current Outpatient Medications:  .  carvedilol (COREG) 6.25 MG tablet, TAKE 2 TABLETS BY MOUTH TWICE DAILY WITH FOOD, Disp: 180 tablet, Rfl: 0 .  Chlorphen-PE-Acetaminophen (NOREL AD) 4-10-325 MG TABS, Take 1 tablet by mouth 2 times per day, Disp: 20 tablet, Rfl: 0 .  Cholecalciferol (VITAMIN D3) 50 MCG (2000 UT) capsule,  Take 2,000 Units by mouth daily., Disp: , Rfl:  .  dapagliflozin propanediol (FARXIGA) 10 MG TABS tablet, Take 1 tablet (10 mg total) by mouth daily before breakfast., Disp: 30 tablet, Rfl: 2 .  ezetimibe (ZETIA) 10 MG tablet, Take 1 tablet by mouth once daily, Disp: 90 tablet, Rfl: 0 .  JANUVIA 100 MG tablet, Take 100 mg by mouth every Monday, Wednesday, and Friday. , Disp: , Rfl:  .  meclizine (ANTIVERT) 25 MG tablet, Take 1 tablet (25 mg total) by mouth 3 (three) times daily as needed for dizziness. (Patient not taking: Reported on 09/01/2020), Disp: 30 tablet, Rfl: 0 .  rosuvastatin (CRESTOR) 40 MG tablet, Take 1 tablet by mouth once daily, Disp: 90 tablet, Rfl: 2 .  vitamin C (ASCORBIC ACID) 500 MG tablet, Take 500 mg by mouth daily., Disp: , Rfl:    No Known Allergies   Review of Systems  Constitutional: Negative.  Negative for chills and fever.  HENT: Negative for congestion, sinus pressure and sinus pain.   Respiratory: Negative.  Negative for shortness of breath and wheezing.   Cardiovascular: Negative.  Negative for chest pain and palpitations.  Gastrointestinal: Negative.   Skin: Positive for color change and rash.       Small patchy areas on her legs   Neurological: Negative.      Today's Vitals   11/13/20 1455  BP: 120/64  Temp: 97.9 F (36.6 C)  TempSrc: Oral  Weight: 162 lb 9.6 oz (73.8 kg)  Height: 4\' 11"  (1.499 m)   Body mass index is 32.84 kg/m.   Objective:  Physical Exam Constitutional:      Appearance: Normal appearance. She is obese.  HENT:     Head: Normocephalic and atraumatic.  Cardiovascular:     Rate and Rhythm: Normal rate and regular rhythm.     Pulses: Normal pulses.     Heart sounds: Normal heart sounds. No murmur heard.   Skin:    General: Skin is warm and dry.     Capillary Refill: Capillary refill takes less than 2 seconds.     Findings: Rash present.     Comments: Small patchy areas on her legs.   Neurological:     General: No focal  deficit present.     Mental Status: She is alert and oriented to person, place, and time.         Assessment And Plan:     1. Rash  -Patient started having some discoloration/patchy rash areas on her skin esp. Her legs after starting the farxiga. She noticed the rash after taking the medication. Could be a possible side-effect of med. Will discontinue farxiga at this time and instructed her to get back on Januvia 100 mg like she was taking before until she sees Dr. in June. The patient agrees to the plan and will continue to keep an eye on the rash to see if it gets worse. Since patient has a history of skin conditions, have told the patient if the rash gets worse to make an appt with the dermatologist or call the office. Gave sample of Januvia 100 mg   Follow up: if symptoms persist or do not get better.   Side effects and appropriate use of all the medication(s) were discussed with the patient today. Patient advised to use the medication(s) as directed by their healthcare provider. The patient was encouraged to read, review, and understand all associated package inserts and contact our office with any questions or concerns. The patient accepts the risks of the treatment plan and had an opportunity to ask questions.   Patient was given opportunity to ask questions. Patient verbalized understanding of the plan and was able to repeat key elements of the plan. All questions were answered to their satisfaction.  Raman Aristidis Talerico, DNP   I, Raman Vollie Aaron have reviewed all documentation for this visit. The documentation on 11/13/20 for the exam, diagnosis, procedures, and orders are all accurate and complete.    IF YOU HAVE BEEN REFERRED TO A SPECIALIST, IT MAY TAKE 1-2 WEEKS TO SCHEDULE/PROCESS THE REFERRAL. IF YOU HAVE NOT HEARD FROM US/SPECIALIST IN TWO WEEKS, PLEASE GIVE 11/15/20 A CALL AT [phone].   THE PATIENT IS ENCOURAGED TO PRACTICE SOCIAL DISTANCING DUE TO THE COVID-19 PANDEMIC.

## 2020-12-08 ENCOUNTER — Other Ambulatory Visit: Payer: Self-pay | Admitting: Internal Medicine

## 2020-12-18 ENCOUNTER — Other Ambulatory Visit: Payer: Self-pay | Admitting: Internal Medicine

## 2020-12-18 ENCOUNTER — Ambulatory Visit: Payer: Medicare PPO

## 2020-12-18 ENCOUNTER — Encounter: Payer: Medicare PPO | Admitting: Internal Medicine

## 2020-12-18 MED ORDER — EZETIMIBE 10 MG PO TABS: 10.0000 mg | ORAL_TABLET | Freq: Every day | ORAL | 1 refills | Status: DC

## 2020-12-29 ENCOUNTER — Ambulatory Visit (INDEPENDENT_AMBULATORY_CARE_PROVIDER_SITE_OTHER): Payer: Medicare PPO | Admitting: Internal Medicine

## 2020-12-29 ENCOUNTER — Encounter: Payer: Self-pay | Admitting: Internal Medicine

## 2020-12-29 ENCOUNTER — Other Ambulatory Visit: Payer: Self-pay

## 2020-12-29 VITALS — BP 136/76 | HR 64 | Temp 97.7°F | Ht 59.0 in | Wt 165.4 lb

## 2020-12-29 DIAGNOSIS — R519 Headache, unspecified: Secondary | ICD-10-CM | POA: Diagnosis not present

## 2020-12-29 DIAGNOSIS — N183 Chronic kidney disease, stage 3 unspecified: Secondary | ICD-10-CM | POA: Diagnosis not present

## 2020-12-29 DIAGNOSIS — N1831 Chronic kidney disease, stage 3a: Secondary | ICD-10-CM

## 2020-12-29 DIAGNOSIS — Z6833 Body mass index (BMI) 33.0-33.9, adult: Secondary | ICD-10-CM | POA: Diagnosis not present

## 2020-12-29 DIAGNOSIS — I129 Hypertensive chronic kidney disease with stage 1 through stage 4 chronic kidney disease, or unspecified chronic kidney disease: Secondary | ICD-10-CM

## 2020-12-29 DIAGNOSIS — Z0000 Encounter for general adult medical examination without abnormal findings: Secondary | ICD-10-CM | POA: Diagnosis not present

## 2020-12-29 DIAGNOSIS — E1122 Type 2 diabetes mellitus with diabetic chronic kidney disease: Secondary | ICD-10-CM | POA: Diagnosis not present

## 2020-12-29 DIAGNOSIS — E6609 Other obesity due to excess calories: Secondary | ICD-10-CM | POA: Diagnosis not present

## 2020-12-29 DIAGNOSIS — Z23 Encounter for immunization: Secondary | ICD-10-CM

## 2020-12-29 MED ORDER — SHINGRIX 50 MCG/0.5ML IM SUSR: 0.5000 mL | Freq: Once | INTRAMUSCULAR | 0 refills | Status: AC

## 2020-12-29 MED ORDER — FLUCONAZOLE 150 MG PO TABS: 150.0000 mg | ORAL_TABLET | Freq: Every day | ORAL | 1 refills | Status: DC

## 2020-12-29 NOTE — Progress Notes (Addendum)
I,Tianna Badgett,acting as a Education administrator for Maximino Greenland, MD.,have documented all relevant documentation on the behalf of Maximino Greenland, MD,as directed by  Maximino Greenland, MD while in the presence of Maximino Greenland, MD.  This visit occurred during the SARS-CoV-2 public health emergency.  Safety protocols were in place, including screening questions prior to the visit, additional usage of staff PPE, and extensive cleaning of exam room while observing appropriate contact time as indicated for disinfecting solutions.  Subjective:     Patient ID: Betty Arroyo , female    DOB: March 14, 1946 , 75 y.o.   MRN: [id number]   Chief Complaint  Patient presents with   Annual Exam   Diabetes   Hypertension    HPI  She is here today for a full physical examination.  She denies having any specific concerns at this time.     However, she does add that she was not able to tolerate [place]. States it caused her to develop full body rash. States it was red, itchy. It resolved once she stopped the medication. Her sx have not returned since stopping the medication.   Diabetes She presents for her follow-up diabetic visit. She has type 2 diabetes mellitus. Her disease course has been stable. Hypoglycemia symptoms include headaches. Pertinent negatives for diabetes include no blurred vision, no chest pain and no fatigue. There are no hypoglycemic complications. Diabetic complications include nephropathy. Risk factors for coronary artery disease include diabetes mellitus, dyslipidemia, hypertension and post-menopausal. Current diabetic treatment includes oral agent (monotherapy). Her weight is stable. She is following a diabetic diet. She participates in exercise intermittently. Her breakfast blood glucose is taken between 8-9 am. Her breakfast blood glucose range is generally 90-110 mg/dl. Her dinner blood glucose is taken between 5-6 pm. Her dinner blood glucose range is generally 130-140 mg/dl. An ACE  inhibitor/angiotensin II receptor blocker is being taken. Eye exam is current.  Hypertension This is a chronic problem. The current episode started more than 1 year ago. The problem has been gradually improving since onset. The problem is controlled. Associated symptoms include headaches. Pertinent negatives include no blurred vision or chest pain. Past treatments include ACE inhibitors, diuretics and angiotensin blockers.    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   Vertigo, benign paroxysmal      Family History  Problem Relation Age of Onset   Diabetes Mother    Hyperlipidemia Mother    Hypertension Mother    Varicose Veins Mother    Hyperlipidemia Brother    Hypertension Brother      Current Outpatient Medications:    fluconazole (DIFLUCAN) 150 MG tablet, Take 1 tablet (150 mg total) by mouth daily., Disp: 1 tablet, Rfl: 1   carvedilol (COREG) 6.25 MG tablet, TAKE 2 TABLETS BY MOUTH TWICE DAILY WITH FOOD, Disp: 180 tablet, Rfl: 0   Chlorphen-PE-Acetaminophen (NOREL AD) 4-10-325 MG TABS, Take 1 tablet by mouth 2 times per day, Disp: 20 tablet, Rfl: 0   Cholecalciferol (VITAMIN D3) 50 MCG (2000 UT) capsule, Take 2,000 Units by mouth daily., Disp: , Rfl:    ezetimibe (ZETIA) 10 MG tablet, Take 1 tablet (10 mg total) by mouth daily., Disp: 90 tablet, Rfl: 1   JANUVIA 100 MG tablet, Take 100 mg by mouth every Monday,  Wednesday, and Friday. , Disp: , Rfl:    meclizine (ANTIVERT) 25 MG tablet, Take 1 tablet (25 mg total) by mouth 3 (three) times daily as needed for dizziness. (Patient not taking: Reported on 09/01/2020), Disp: 30 tablet, Rfl: 0   rosuvastatin (CRESTOR) 40 MG tablet, Take 1 tablet by mouth once daily, Disp: 90 tablet, Rfl: 2   vitamin C (ASCORBIC ACID) 500 MG tablet, Take 500 mg by mouth daily., Disp: ,  Rfl:    No Known Allergies    The patient states she uses post menopausal status for birth control. Last LMP was No LMP recorded. Patient has had a hysterectomy.. Negative for Dysmenorrhea. Negative for: breast discharge, breast lump(s), breast pain and breast self exam. Associated symptoms include abnormal vaginal bleeding. Pertinent negatives include abnormal bleeding (hematology), anxiety, decreased libido, depression, difficulty falling sleep, dyspareunia, history of infertility, nocturia, sexual dysfunction, sleep disturbances, urinary incontinence, urinary urgency, vaginal discharge and vaginal itching. Diet regular.The patient states her exercise level is    . The patient's tobacco use is:  Social History   Tobacco Use  Smoking Status Never  Smokeless Tobacco Never  . She has been exposed to passive smoke. The patient's alcohol use is:  Social History   Substance and Sexual Activity  Alcohol Use No    Review of Systems  Constitutional: Negative.  Negative for fatigue.  HENT:  Positive for congestion.   Eyes: Negative.  Negative for blurred vision.  Respiratory: Negative.    Cardiovascular: Negative.  Negative for chest pain.  Gastrointestinal: Negative.   Endocrine: Negative.   Genitourinary: Negative.   Musculoskeletal: Negative.   Skin: Negative.   Allergic/Immunologic: Negative.   Neurological:  Positive for headaches.  Hematological: Negative.   Psychiatric/Behavioral: Negative.      Today's Vitals   12/29/20 1217  BP: 136/76  Pulse: 64  Temp: 97.7 F (36.5 C)  TempSrc: Oral  Weight: 165 lb 6.4 oz (75 kg)  Height: $Remove'4\' 11"'XIOGeTC$  (1.499 m)   Body mass index is 33.41 kg/m.   Objective:  Physical Exam Vitals and nursing note reviewed.  Constitutional:      Appearance: Normal appearance.  HENT:     Head: Normocephalic and atraumatic.     Right Ear: Tympanic membrane, ear canal and external ear normal.     Left Ear: Tympanic membrane, ear canal and external ear  normal.     Nose: Nose normal.     Mouth/Throat:     Mouth: Mucous membranes are moist.     Pharynx: Oropharynx is clear.  Eyes:     Extraocular Movements: Extraocular movements intact.     Conjunctiva/sclera: Conjunctivae normal.     Pupils: Pupils are equal, round, and reactive to light.  Cardiovascular:     Rate and Rhythm: Normal rate and regular rhythm.     Pulses: Normal pulses.          Dorsalis pedis pulses are 2+ on the right side and 2+ on the left side.     Heart sounds: Normal heart sounds.  Pulmonary:     Effort: Pulmonary effort is normal.     Breath sounds: Normal breath sounds.  Chest:  Breasts:    Tanner Score is 5.     Right: Normal.     Left: Normal.  Abdominal:     General: Abdomen is flat. Bowel sounds are normal.     Palpations: Abdomen is soft.  Genitourinary:    Comments: deferred Musculoskeletal:  General: Normal range of motion.     Cervical back: Normal range of motion and neck supple.  Feet:     Right foot:     Protective Sensation: 5 sites tested.  5 sites sensed.     Skin integrity: Skin integrity normal.     Toenail Condition: Right toenails are normal.     Left foot:     Protective Sensation: 5 sites tested.  5 sites sensed.     Skin integrity: Skin integrity normal.     Toenail Condition: Left toenails are normal.  Skin:    General: Skin is warm and dry.  Neurological:     General: No focal deficit present.     Mental Status: She is alert and oriented to person, place, and time.  Psychiatric:        Mood and Affect: Mood normal.        Behavior: Behavior normal.        Assessment And Plan:     1. Encounter for annual physical exam Comments: A full exam was performed. Importance of monthly self breast exams was discussed with the patient. PATIENT IS ADVISED TO GET 30-45 MINUTES REGULAR EXERCISE NO LESS THAN FOUR TO FIVE DAYS PER WEEK - BOTH WEIGHTBEARING EXERCISES AND AEROBIC ARE RECOMMENDED.  PATIENT IS ADVISED TO FOLLOW A  HEALTHY DIET WITH AT LEAST SIX FRUITS/VEGGIES PER DAY, DECREASE INTAKE OF RED MEAT, AND TO INCREASE FISH INTAKE TO TWO DAYS PER WEEK.  MEATS/FISH SHOULD NOT BE FRIED, BAKED OR BROILED IS PREFERABLE.  IT IS ALSO IMPORTANT TO CUT BACK ON YOUR SUGAR INTAKE. PLEASE AVOID ANYTHING WITH ADDED SUGAR, CORN SYRUP OR OTHER SWEETENERS. IF YOU MUST USE A SWEETENER, YOU CAN TRY STEVIA. IT IS ALSO IMPORTANT TO AVOID ARTIFICIALLY SWEETENERS AND DIET BEVERAGES. LASTLY, I SUGGEST WEARING SPF 50 SUNSCREEN ON EXPOSED PARTS AND ESPECIALLY WHEN IN THE DIRECT SUNLIGHT FOR AN EXTENDED PERIOD OF TIME.  PLEASE AVOID FAST FOOD RESTAURANTS AND INCREASE YOUR WATER INTAKE.   2. Type 2 diabetes mellitus with stage 3a chronic kidney disease, without long-term current use of insulin (HCC) Comments: Diabetic foot exam was performed. She is now back on Januvia, I hesitate to consider Jardiance given allergic rx to [place].I DISCUSSED WITH THE PATIENT AT LENGTH REGARDING THE GOALS OF GLYCEMIC CONTROL AND POSSIBLE LONG-TERM COMPLICATIONS.  I  ALSO STRESSED THE IMPORTANCE OF COMPLIANCE WITH HOME GLUCOSE MONITORING, DIETARY RESTRICTIONS INCLUDING AVOIDANCE OF SUGARY DRINKS/PROCESSED FOODS,  ALONG WITH REGULAR EXERCISE.  I  ALSO STRESSED THE IMPORTANCE OF ANNUAL EYE EXAMS, SELF FOOT CARE AND COMPLIANCE WITH OFFICE VISITS.  - POCT Urinalysis Dipstick (81002) - POCT UA - Microalbumin - EKG 12-Lead - CBC - Hemoglobin A1c - CMP14+EGFR - Lipid panel  3. Parenchymal renal hypertension, stage 1 through stage 4 or unspecified chronic kidney disease Comments: Chronic, fair control. She is aware that goal BP is less than 130/80. Encouraged to follow low sodium diet. EKG performed, NSR w/o acute changes.   4. Sinus headache Comments: She wil c/w cetirizine daily. Advised to limit her dairy intake.   5. Class 1 obesity due to excess calories with serious comorbidity and body mass index (BMI) of 33.0 to 33.9 in adult Comments: She is encouraged  to aim for at least 150 minutes of exercise per week.   6. Immunization due Comments: I will send rx Shingrix to her local pharmacy.   Patient was given opportunity to ask questions. Patient verbalized understanding of the plan and was able  to repeat key elements of the plan. All questions were answered to their satisfaction.   I, Maximino Greenland, MD, have reviewed all documentation for this visit. The documentation on 01/05/21 for the exam, diagnosis, procedures, and orders are all accurate and complete.   THE PATIENT IS ENCOURAGED TO PRACTICE SOCIAL DISTANCING DUE TO THE COVID-19 PANDEMIC.

## 2020-12-29 NOTE — Patient Instructions (Signed)
Health Maintenance, Female Adopting a healthy lifestyle and getting preventive care are important in promoting health and wellness. Ask your health care provider about: The right schedule for you to have regular tests and exams. Things you can do on your own to prevent diseases and keep yourself healthy. What should I know about diet, weight, and exercise? Eat a healthy diet  Eat a diet that includes plenty of vegetables, fruits, low-fat dairy products, and lean protein. Do not eat a lot of foods that are high in solid fats, added sugars, or sodium.  Maintain a healthy weight Body mass index (BMI) is used to identify weight problems. It estimates body fat based on height and weight. Your health care provider can help determineyour BMI and help you achieve or maintain a healthy weight. Get regular exercise Get regular exercise. This is one of the most important things you can do for your health. Most adults should: Exercise for at least 150 minutes each week. The exercise should increase your heart rate and make you sweat (moderate-intensity exercise). Do strengthening exercises at least twice a week. This is in addition to the moderate-intensity exercise. Spend less time sitting. Even light physical activity can be beneficial. Watch cholesterol and blood lipids Have your blood tested for lipids and cholesterol at 73 years of age, then havethis test every 5 years. Have your cholesterol levels checked more often if: Your lipid or cholesterol levels are high. You are older than 73 years of age. You are at high risk for heart disease. What should I know about cancer screening? Depending on your health history and family history, you may need to have cancer screening at various ages. This may include screening for: Breast cancer. Cervical cancer. Colorectal cancer. Skin cancer. Lung cancer. What should I know about heart disease, diabetes, and high blood pressure? Blood pressure and heart  disease High blood pressure causes heart disease and increases the risk of stroke. This is more likely to develop in people who have high blood pressure readings, are of African descent, or are overweight. Have your blood pressure checked: Every 3-5 years if you are 18-39 years of age. Every year if you are 40 years old or older. Diabetes Have regular diabetes screenings. This checks your fasting blood sugar level. Have the screening done: Once every three years after age 40 if you are at a normal weight and have a low risk for diabetes. More often and at a younger age if you are overweight or have a high risk for diabetes. What should I know about preventing infection? Hepatitis B If you have a higher risk for hepatitis B, you should be screened for this virus. Talk with your health care provider to find out if you are at risk forhepatitis B infection. Hepatitis C Testing is recommended for: Everyone born from 1945 through 1965. Anyone with known risk factors for hepatitis C. Sexually transmitted infections (STIs) Get screened for STIs, including gonorrhea and chlamydia, if: You are sexually active and are younger than 73 years of age. You are older than 73 years of age and your health care provider tells you that you are at risk for this type of infection. Your sexual activity has changed since you were last screened, and you are at increased risk for chlamydia or gonorrhea. Ask your health care provider if you are at risk. Ask your health care provider about whether you are at high risk for HIV. Your health care provider may recommend a prescription medicine to help   prevent HIV infection. If you choose to take medicine to prevent HIV, you should first get tested for HIV. You should then be tested every 3 months for as long as you are taking the medicine. Pregnancy If you are about to stop having your period (premenopausal) and you may become pregnant, seek counseling before you get  pregnant. Take 400 to 800 micrograms (mcg) of folic acid every day if you become pregnant. Ask for birth control (contraception) if you want to prevent pregnancy. Osteoporosis and menopause Osteoporosis is a disease in which the bones lose minerals and strength with aging. This can result in bone fractures. If you are 65 years old or older, or if you are at risk for osteoporosis and fractures, ask your health care provider if you should: Be screened for bone loss. Take a calcium or vitamin D supplement to lower your risk of fractures. Be given hormone replacement therapy (HRT) to treat symptoms of menopause. Follow these instructions at home: Lifestyle Do not use any products that contain nicotine or tobacco, such as cigarettes, e-cigarettes, and chewing tobacco. If you need help quitting, ask your health care provider. Do not use street drugs. Do not share needles. Ask your health care provider for help if you need support or information about quitting drugs. Alcohol use Do not drink alcohol if: Your health care provider tells you not to drink. You are pregnant, may be pregnant, or are planning to become pregnant. If you drink alcohol: Limit how much you use to 0-1 drink a day. Limit intake if you are breastfeeding. Be aware of how much alcohol is in your drink. In the [place] one drink equals one 12 oz bottle of beer (355 mL), one 5 oz glass of wine (148 mL), or one 1 oz glass of hard liquor (44 mL). General instructions Schedule regular health, dental, and eye exams. Stay current with your vaccines. Tell your health care provider if: You often feel depressed. You have ever been abused or do not feel safe at home. Summary Adopting a healthy lifestyle and getting preventive care are important in promoting health and wellness. Follow your health care provider's instructions about healthy diet, exercising, and getting tested or screened for diseases. Follow your health care provider's  instructions on monitoring your cholesterol and blood pressure. This information is not intended to replace advice given to you by your health care provider. Make sure you discuss any questions you have with your healthcare provider. Document Revised: 05/31/2018 Document Reviewed: 05/31/2018 [employer]vier Patient Education  2022 [employer]  

## 2020-12-30 LAB — CBC
Hematocrit: 47.1 % — ABNORMAL HIGH (ref 34.0–46.6)
Hemoglobin: 15.5 g/dL (ref 11.1–15.9)
MCH: 28.7 pg (ref 26.6–33.0)
MCHC: 32.9 g/dL (ref 31.5–35.7)
MCV: 87 fL (ref 79–97)
Platelets: 199 10*3/uL (ref 150–450)
RBC: 5.4 x10E6/uL — ABNORMAL HIGH (ref 3.77–5.28)
RDW: 14.1 % (ref 11.7–15.4)
WBC: 5.7 10*3/uL (ref 3.4–10.8)

## 2020-12-30 LAB — CMP14+EGFR
ALT: 17 IU/L (ref 0–32)
AST: 18 IU/L (ref 0–40)
Albumin/Globulin Ratio: 1.9 (ref 1.2–2.2)
Albumin: 4.6 g/dL (ref 3.7–4.7)
Alkaline Phosphatase: 97 IU/L (ref 44–121)
BUN/Creatinine Ratio: 16 (ref 12–28)
BUN: 15 mg/dL (ref 8–27)
Bilirubin Total: 0.6 mg/dL (ref 0.0–1.2)
CO2: 23 mmol/L (ref 20–29)
Calcium: 9.5 mg/dL (ref 8.7–10.3)
Chloride: 106 mmol/L (ref 96–106)
Creatinine, Ser: 0.95 mg/dL (ref 0.57–1.00)
Globulin, Total: 2.4 g/dL (ref 1.5–4.5)
Glucose: 99 mg/dL (ref 65–99)
Potassium: 3.9 mmol/L (ref 3.5–5.2)
Sodium: 144 mmol/L (ref 134–144)
Total Protein: 7 g/dL (ref 6.0–8.5)
eGFR: 63 mL/min/{1.73_m2} (ref >59)

## 2020-12-30 LAB — POCT URINALYSIS DIPSTICK
Bilirubin, UA: NEGATIVE
Glucose, UA: POSITIVE — AB
Ketones, UA: NEGATIVE
Nitrite, UA: NEGATIVE
Protein, UA: POSITIVE — AB
Spec Grav, UA: 1.025 (ref 1.010–1.025)
Urobilinogen, UA: 1 E.U./dL
pH, UA: 6 (ref 5.0–8.0)

## 2020-12-30 LAB — POCT UA - MICROALBUMIN
Creatinine, POC: 200 mg/dL
Microalbumin Ur, POC: 150 mg/L

## 2020-12-30 LAB — HEMOGLOBIN A1C
Est. average glucose Bld gHb Est-mCnc: 163 mg/dL
Hgb A1c MFr Bld: 7.3 % — ABNORMAL HIGH (ref 4.8–5.6)

## 2020-12-30 LAB — LIPID PANEL
Chol/HDL Ratio: 3.4 ratio (ref 0.0–4.4)
Cholesterol, Total: 173 mg/dL (ref 100–199)
HDL: 51 mg/dL (ref >39)
LDL Chol Calc (NIH): 100 mg/dL — ABNORMAL HIGH (ref 0–99)
Triglycerides: 125 mg/dL (ref 0–149)
VLDL Cholesterol Cal: 22 mg/dL (ref 5–40)

## 2021-01-01 DIAGNOSIS — Z780 Asymptomatic menopausal state: Secondary | ICD-10-CM | POA: Diagnosis not present

## 2021-01-01 DIAGNOSIS — Z1231 Encounter for screening mammogram for malignant neoplasm of breast: Secondary | ICD-10-CM | POA: Diagnosis not present

## 2021-01-01 LAB — HM MAMMOGRAPHY

## 2021-01-01 LAB — HM DEXA SCAN

## 2021-01-07 ENCOUNTER — Encounter: Payer: Self-pay | Admitting: Internal Medicine

## 2021-01-23 ENCOUNTER — Other Ambulatory Visit: Payer: Self-pay | Admitting: Internal Medicine

## 2021-02-06 ENCOUNTER — Encounter: Payer: Self-pay | Admitting: Internal Medicine

## 2021-02-11 ENCOUNTER — Encounter: Payer: Self-pay | Admitting: Internal Medicine

## 2021-03-05 ENCOUNTER — Other Ambulatory Visit: Payer: Self-pay

## 2021-03-05 ENCOUNTER — Ambulatory Visit (INDEPENDENT_AMBULATORY_CARE_PROVIDER_SITE_OTHER): Payer: Medicare PPO

## 2021-03-05 ENCOUNTER — Ambulatory Visit: Payer: Medicare PPO | Admitting: Internal Medicine

## 2021-03-05 VITALS — BP 130/76 | HR 68 | Temp 98.4°F | Ht 60.0 in | Wt 165.0 lb

## 2021-03-05 DIAGNOSIS — Z0000 Encounter for general adult medical examination without abnormal findings: Secondary | ICD-10-CM

## 2021-03-05 DIAGNOSIS — Z23 Encounter for immunization: Secondary | ICD-10-CM

## 2021-03-05 NOTE — Progress Notes (Signed)
This visit occurred during the SARS-CoV-2 public health emergency.  Safety protocols were in place, including screening questions prior to the visit, additional usage of staff PPE, and extensive cleaning of exam room while observing appropriate contact time as indicated for disinfecting solutions.  Subjective:   Betty Arroyo is a 75 y.o. female who presents for Medicare Annual (Subsequent) preventive examination.  Review of Systems     Cardiac Risk Factors include: advanced age (>65men, >80 women);diabetes mellitus;hypertension;obesity (BMI >30kg/m2);sedentary lifestyle     Objective:    Today's Vitals   03/05/21 0921  BP: 130/76  Pulse: 68  Temp: 98.4 F (36.9 C)  TempSrc: Oral  Weight: 165 lb (74.8 kg)  Height: 5' (1.524 m)   Body mass index is 32.22 kg/m.  Advanced Directives 03/05/2021 12/12/2019 02/16/2019 02/13/2019 12/06/2018 04/23/2015  Does Patient Have a Medical Advance Directive? Yes Yes Yes Yes Yes Yes  Type of Estate agent of [place];Living will Healthcare Power of [place];Living will Healthcare Power of [place];Living will - Healthcare Power of [place];Living will Healthcare Power of [place];Living will  Does patient want to make changes to medical advance directive? - - No - Patient declined - - No - Patient declined  Copy of Healthcare Power of Attorney in Chart? No - copy requested No - copy requested No - copy requested - No - copy requested No - copy requested    Current Medications (verified) Outpatient Encounter Medications as of 03/05/2021  Medication Sig   carvedilol (COREG) 6.25 MG tablet TAKE 2 TABLETS BY MOUTH TWICE DAILY WITH FOOD   Cholecalciferol (VITAMIN D3) 50 MCG (2000 UT) capsule Take 2,000 Units by mouth daily.   ezetimibe (ZETIA) 10 MG tablet Take 1 tablet (10 mg total) by mouth daily.   JANUVIA 100 MG tablet Take 100 mg by mouth every Monday, Wednesday, and Friday.    rosuvastatin (CRESTOR) 40 MG tablet Take 1 tablet  by mouth once daily   vitamin C (ASCORBIC ACID) 500 MG tablet Take 500 mg by mouth daily.   Chlorphen-PE-Acetaminophen (NOREL AD) 4-10-325 MG TABS Take 1 tablet by mouth 2 times per day (Patient not taking: Reported on 03/05/2021)   fluconazole (DIFLUCAN) 150 MG tablet Take 1 tablet (150 mg total) by mouth daily. (Patient not taking: Reported on 03/05/2021)   meclizine (ANTIVERT) 25 MG tablet Take 1 tablet (25 mg total) by mouth 3 (three) times daily as needed for dizziness. (Patient not taking: No sig reported)   No facility-administered encounter medications on file as of 03/05/2021.    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   Procedure: TOTAL KNEE ARTHROPLASTY;  Surgeon: Jodi Geralds, MD;  Location: [facility];  Service: Orthopedics;  Laterality: Left;   Family History  Problem Relation Age of Onset   Diabetes Mother    Hyperlipidemia Mother    Hypertension Mother    Varicose Veins Mother    Hyperlipidemia Brother    Hypertension Brother    Social History   Socioeconomic History   Marital status: Married  Spouse name: Not on file   Number of children: Not on file   Years of education: Not on file   Highest education level: Not on file  Occupational History   Occupation: retired  Tobacco Use   Smoking status: Never   Smokeless tobacco: Never  Vaping Use   Vaping Use: Never used  Substance and Sexual Activity   Alcohol use: No   Drug use: No   Sexual activity: Yes    Birth control/protection: Post-menopausal  Other Topics Concern   Not on file   Social History Narrative   Not on file   Social Determinants of Health   Financial Resource Strain: Low Risk    Difficulty of Paying Living Expenses: Not hard at all  Food Insecurity: No Food Insecurity   Worried About Programme researcher, broadcasting/film/video in the Last Year: Never true   Ran Out of Food in the Last Year: Never true  Transportation Needs: No Transportation Needs   Lack of Transportation (Medical): No   Lack of Transportation (Non-Medical): No  Physical Activity: Inactive   Days of Exercise per Week: 0 days   Minutes of Exercise per Session: 0 min  Stress: No Stress Concern Present   Feeling of Stress : Not at all  Social Connections: Not on file    Tobacco Counseling Counseling given: Not Answered   Clinical Intake:  Pre-visit preparation completed: Yes  Pain : No/denies pain     Nutritional Status: BMI > 30  Obese Nutritional Risks: None Diabetes: Yes  How often do you need to have someone help you when you read instructions, pamphlets, or other written materials from your doctor or pharmacy?: 1 - Never What is the last grade level you completed in school?: master's degree  Diabetic? Yes Nutrition Risk Assessment:  Has the patient had any N/V/D within the last 2 months?  No  Does the patient have any non-healing wounds?  No  Has the patient had any unintentional weight loss or weight gain?  No   Diabetes:  Is the patient diabetic?  Yes  If diabetic, was a CBG obtained today?  No  Did the patient bring in their glucometer from home?  No  How often do you monitor your CBG's? 1 weekly.   Financial Strains and Diabetes Management:  Are you having any financial strains with the device, your supplies or your medication? No .  Does the patient want to be seen by Chronic Care Management for management of their diabetes?  No  Would the patient like to be referred to a Nutritionist or for Diabetic Management?  No   Diabetic Exams:  Diabetic Eye Exam: Overdue for  diabetic eye exam. Pt has been advised about the importance in completing this exam. Patient advised to call and schedule an eye exam. Diabetic Foot Exam: Completed 12/29/2020   Interpreter Needed?: No  Information entered by :: NAllen LPN   Activities of Daily Living In your present state of health, do you have any difficulty performing the following activities: 03/05/2021 12/29/2020  Hearing? N N  Vision? N N  Difficulty concentrating or making decisions? N N  Walking or climbing stairs? N N  Dressing or bathing? N N  Doing errands, shopping? N N  Preparing Food and eating ? N -  Using the Toilet? N -  In the past six months, have you accidently leaked urine? N -  Do you have problems with loss of bowel control? N -  Managing your Medications?  N -  Managing your Finances? N -  Housekeeping or managing your Housekeeping? N -  Some recent data might be hidden    Patient Care Team: Dorothyann Peng, MD as PCP - General (Internal Medicine)  Indicate any recent Medical Services you may have received from other than [facility] providers in the past year (date may be approximate).     Assessment:   This is a routine wellness examination for [place].  Hearing/Vision screen Vision Screening - Comments:: Regular eye exams, [facility]  Dietary issues and exercise activities discussed: Current Exercise Habits: The patient does not participate in regular exercise at present   Goals Addressed             This Visit's Progress    Patient Stated       03/05/2021, Manage A1C       Depression Screen PHQ 2/9 Scores 03/05/2021 12/29/2020 12/12/2019 08/14/2019 01/25/2019 12/06/2018 07/24/2018  PHQ - 2 Score 0 0 0 0 0 0 0  PHQ- 9 Score - 0 - - - 0 -    Fall Risk Fall Risk  03/05/2021 12/29/2020 12/12/2019 01/25/2019 12/06/2018  Falls in the past year? 0 0 0 0 0  Number falls in past yr: - 0 - - -  Injury with Fall? - 0 - - -  Risk for fall due to : Medication side effect - Medication side effect -  Medication side effect  Follow up Falls evaluation completed;Education provided;Falls prevention discussed - Falls evaluation completed;Education provided;Falls prevention discussed - Education provided;Falls prevention discussed    FALL RISK PREVENTION PERTAINING TO THE HOME:  Any stairs in or around the home? Yes  If so, are there any without handrails? No  Home free of loose throw rugs in walkways, pet beds, electrical cords, etc? Yes  Adequate lighting in your home to reduce risk of falls? Yes   ASSISTIVE DEVICES UTILIZED TO PREVENT FALLS:  Life alert? No  Use of a cane, walker or w/c? No  Grab bars in the bathroom? Yes  Shower chair or bench in shower? Yes  Elevated toilet seat or a handicapped toilet? Yes   TIMED UP AND GO:  Was the test performed? No .    Gait steady and fast without use of assistive device  Cognitive Function:     6CIT Screen 03/05/2021 12/12/2019 12/06/2018  What Year? 0 points 0 points 0 points  What month? 0 points 0 points 0 points  What time? 0 points 0 points 0 points  Count back from 20 0 points 2 points 0 points  Months in reverse 0 points 0 points 0 points  Repeat phrase 0 points 2 points 0 points  Total Score 0 4 0    Immunizations Immunization History  Administered Date(s) Administered   Fluad Quad(high Dose 65+) 04/08/2020, 03/05/2021   Influenza, High Dose Seasonal PF 07/24/2018, 04/24/2019   Influenza-Unspecified 04/24/2019   PFIZER(Purple Top)SARS-COV-2 Vaccination 07/28/2019, 08/18/2019, 06/02/2020   Pneumococcal Conjugate-13 04/24/2019   Pneumococcal Polysaccharide-23 10/07/2014    TDAP status: Up to date  Flu Vaccine status: Completed at today's visit  Pneumococcal vaccine status: Up to date  Covid-19 vaccine status: Completed vaccines  Qualifies for Shingles Vaccine? Yes   Zostavax completed No   Shingrix Completed?: No.    Education has been provided regarding the importance of this vaccine. Patient has been advised  to call insurance company to determine out of pocket expense if they have not yet received this vaccine. Advised may also receive vaccine  at local pharmacy or Health Dept. Verbalized acceptance and understanding.  Screening Tests Health Maintenance  Topic Date Due   Zoster Vaccines- Shingrix (1 of 2) Never done   COVID-19 Vaccine (4 - Booster for Pfizer series) 08/25/2020   OPHTHALMOLOGY EXAM  12/09/2020   HEMOGLOBIN A1C  07/01/2021   FOOT EXAM  12/29/2021   URINE MICROALBUMIN  12/30/2021   TETANUS/TDAP  05/04/2022   MAMMOGRAM  01/02/2023   COLONOSCOPY (Pts 45-30yrs Insurance coverage will need to be confirmed)  04/14/2028   INFLUENZA VACCINE  Completed   DEXA SCAN  Completed   Hepatitis C Screening  Completed   PNA vac Low Risk Adult  Completed   HPV VACCINES  Aged Out    Health Maintenance  Health Maintenance Due  Topic Date Due   Zoster Vaccines- Shingrix (1 of 2) Never done   COVID-19 Vaccine (4 - Booster for Pfizer series) 08/25/2020   OPHTHALMOLOGY EXAM  12/09/2020    Colorectal cancer screening: Type of screening: Colonoscopy. Completed 04/14/2018. Repeat every 10 years  Mammogram status: Completed 01/01/2021. Repeat every year  Bone Density status: Completed 01/01/2021. Results reflect: Bone density results: NORMAL. Repeat every 0 years.  Lung Cancer Screening: (Low Dose CT Chest recommended if Age 64-80 years, 30 pack-year currently smoking OR have quit w/in 15years.) does not qualify.   Lung Cancer Screening Referral: no  Additional Screening:  Hepatitis C Screening: does qualify; Completed 09/01/2012  Vision Screening: Recommended annual ophthalmology exams for early detection of glaucoma and other disorders of the eye. Is the patient up to date with their annual eye exam?  Yes  Who is the provider or what is the name of the office in which the patient attends annual eye exams? [facility] If pt is not established with a provider, would they like to be referred to a  provider to establish care? No .   Dental Screening: Recommended annual dental exams for proper oral hygiene  Community Resource Referral / Chronic Care Management: CRR required this visit?  No   CCM required this visit?  No      Plan:     I have personally reviewed and noted the following in the patient's chart:   Medical and social history Use of alcohol, tobacco or illicit drugs  Current medications and supplements including opioid prescriptions.  Functional ability and status Nutritional status Physical activity Advanced directives List of other physicians Hospitalizations, surgeries, and ER visits in previous 12 months Vitals Screenings to include cognitive, depression, and falls Referrals and appointments  In addition, I have reviewed and discussed with patient certain preventive protocols, quality metrics, and best practice recommendations. A written personalized care plan for preventive services as well as general preventive health recommendations were provided to patient.     Barb Merino, LPN   [id number]   Nurse Notes:

## 2021-03-05 NOTE — Patient Instructions (Signed)
Betty Arroyo , Thank you for taking time to come for your Medicare Wellness Visit. I appreciate your ongoing commitment to your health goals. Please review the following plan we discussed and let me know if I can assist you in the future.   Screening recommendations/referrals: Colonoscopy: completed 04/14/2018 Mammogram: completed 01/01/2021 Bone Density: completed 01/01/2021 Recommended yearly ophthalmology/optometry visit for glaucoma screening and checkup Recommended yearly dental visit for hygiene and checkup  Vaccinations: Influenza vaccine: today Pneumococcal vaccine: completed 04/24/2019 Tdap vaccine: completed 05/04/2012 Shingles vaccine: discussed   Covid-19: 06/02/2020, 08/18/2019, 07/28/2019  Advanced directives: Please bring a copy of your POA (Power of Attorney) and/or Living Will to your next appointment.   Conditions/risks identified: none  Next appointment: Follow up in one year for your annual wellness visit    Preventive Care 65 Years and Older, Female Preventive care refers to lifestyle choices and visits with your health care provider that can promote health and wellness. What does preventive care include? A yearly physical exam. This is also called an annual well check. Dental exams once or twice a year. Routine eye exams. Ask your health care provider how often you should have your eyes checked. Personal lifestyle choices, including: Daily care of your teeth and gums. Regular physical activity. Eating a healthy diet. Avoiding tobacco and drug use. Limiting alcohol use. Practicing safe sex. Taking low-dose aspirin every day. Taking vitamin and mineral supplements as recommended by your health care provider. What happens during an annual well check? The services and screenings done by your health care provider during your annual well check will depend on your age, overall health, lifestyle risk factors, and family history of disease. Counseling  Your health care  provider may ask you questions about your: Alcohol use. Tobacco use. Drug use. Emotional well-being. Home and relationship well-being. Sexual activity. Eating habits. History of falls. Memory and ability to understand (cognition). Work and work Astronomer. Reproductive health. Screening  You may have the following tests or measurements: Height, weight, and BMI. Blood pressure. Lipid and cholesterol levels. These may be checked every 5 years, or more frequently if you are over 84 years old. Skin check. Lung cancer screening. You may have this screening every year starting at age 61 if you have a 30-pack-year history of smoking and currently smoke or have quit within the past 15 years. Fecal occult blood test (FOBT) of the stool. You may have this test every year starting at age 29. Flexible sigmoidoscopy or colonoscopy. You may have a sigmoidoscopy every 5 years or a colonoscopy every 10 years starting at age 43. Hepatitis C blood test. Hepatitis B blood test. Sexually transmitted disease (STD) testing. Diabetes screening. This is done by checking your blood sugar (glucose) after you have not eaten for a while (fasting). You may have this done every 1-3 years. Bone density scan. This is done to screen for osteoporosis. You may have this done starting at age 63. Mammogram. This may be done every 1-2 years. Talk to your health care provider about how often you should have regular mammograms. Talk with your health care provider about your test results, treatment options, and if necessary, the need for more tests. Vaccines  Your health care provider may recommend certain vaccines, such as: Influenza vaccine. This is recommended every year. Tetanus, diphtheria, and acellular pertussis (Tdap, Td) vaccine. You may need a Td booster every 10 years. Zoster vaccine. You may need this after age 29. Pneumococcal 13-valent conjugate (PCV13) vaccine. One dose is recommended after  age  24. Pneumococcal polysaccharide (PPSV23) vaccine. One dose is recommended after age 48. Talk to your health care provider about which screenings and vaccines you need and how often you need them. This information is not intended to replace advice given to you by your health care provider. Make sure you discuss any questions you have with your health care provider. Document Released: 07/04/2015 Document Revised: 02/25/2016 Document Reviewed: 04/08/2015 Elsevier Interactive Patient Education  2017 [place] Prevention in the Home Falls can cause injuries. They can happen to people of all ages. There are many things you can do to make your home safe and to help prevent falls. What can I do on the outside of my home? Regularly fix the edges of walkways and driveways and fix any cracks. Remove anything that might make you trip as you walk through a door, such as a raised step or threshold. Trim any bushes or trees on the path to your home. Use bright outdoor lighting. Clear any walking paths of anything that might make someone trip, such as rocks or tools. Regularly check to see if handrails are loose or broken. Make sure that both sides of any steps have handrails. Any raised decks and porches should have guardrails on the edges. Have any leaves, snow, or ice cleared regularly. Use sand or salt on walking paths during winter. Clean up any spills in your garage right away. This includes oil or grease spills. What can I do in the bathroom? Use night lights. Install grab bars by the toilet and in the tub and shower. Do not use towel bars as grab bars. Use non-skid mats or decals in the tub or shower. If you need to sit down in the shower, use a plastic, non-slip stool. Keep the floor dry. Clean up any water that spills on the floor as soon as it happens. Remove soap buildup in the tub or shower regularly. Attach bath mats securely with double-sided non-slip rug tape. Do not have throw  rugs and other things on the floor that can make you trip. What can I do in the bedroom? Use night lights. Make sure that you have a light by your bed that is easy to reach. Do not use any sheets or blankets that are too big for your bed. They should not hang down onto the floor. Have a firm chair that has side arms. You can use this for support while you get dressed. Do not have throw rugs and other things on the floor that can make you trip. What can I do in the kitchen? Clean up any spills right away. Avoid walking on wet floors. Keep items that you use a lot in easy-to-reach places. If you need to reach something above you, use a strong step stool that has a grab bar. Keep electrical cords out of the way. Do not use floor polish or wax that makes floors slippery. If you must use wax, use non-skid floor wax. Do not have throw rugs and other things on the floor that can make you trip. What can I do with my stairs? Do not leave any items on the stairs. Make sure that there are handrails on both sides of the stairs and use them. Fix handrails that are broken or loose. Make sure that handrails are as long as the stairways. Check any carpeting to make sure that it is firmly attached to the stairs. Fix any carpet that is loose or worn. Avoid having throw rugs  at the top or bottom of the stairs. If you do have throw rugs, attach them to the floor with carpet tape. Make sure that you have a light switch at the top of the stairs and the bottom of the stairs. If you do not have them, ask someone to add them for you. What else can I do to help prevent falls? Wear shoes that: Do not have high heels. Have rubber bottoms. Are comfortable and fit you well. Are closed at the toe. Do not wear sandals. If you use a stepladder: Make sure that it is fully opened. Do not climb a closed stepladder. Make sure that both sides of the stepladder are locked into place. Ask someone to hold it for you, if  possible. Clearly mark and make sure that you can see: Any grab bars or handrails. First and last steps. Where the edge of each step is. Use tools that help you move around (mobility aids) if they are needed. These include: Canes. Walkers. Scooters. Crutches. Turn on the lights when you go into a dark area. Replace any light bulbs as soon as they burn out. Set up your furniture so you have a clear path. Avoid moving your furniture around. If any of your floors are uneven, fix them. If there are any pets around you, be aware of where they are. Review your medicines with your doctor. Some medicines can make you feel dizzy. This can increase your chance of falling. Ask your doctor what other things that you can do to help prevent falls. This information is not intended to replace advice given to you by your health care provider. Make sure you discuss any questions you have with your health care provider. Document Released: 04/03/2009 Document Revised: 11/13/2015 Document Reviewed: 07/12/2014 Elsevier Interactive Patient Education  2017 [employer].

## 2021-03-09 DIAGNOSIS — E785 Hyperlipidemia, unspecified: Secondary | ICD-10-CM | POA: Diagnosis not present

## 2021-03-09 DIAGNOSIS — Z833 Family history of diabetes mellitus: Secondary | ICD-10-CM | POA: Diagnosis not present

## 2021-03-09 DIAGNOSIS — E669 Obesity, unspecified: Secondary | ICD-10-CM | POA: Diagnosis not present

## 2021-03-09 DIAGNOSIS — Z8249 Family history of ischemic heart disease and other diseases of the circulatory system: Secondary | ICD-10-CM | POA: Diagnosis not present

## 2021-03-09 DIAGNOSIS — E119 Type 2 diabetes mellitus without complications: Secondary | ICD-10-CM | POA: Diagnosis not present

## 2021-03-09 DIAGNOSIS — I10 Essential (primary) hypertension: Secondary | ICD-10-CM | POA: Diagnosis not present

## 2021-03-09 DIAGNOSIS — Z6831 Body mass index (BMI) 31.0-31.9, adult: Secondary | ICD-10-CM | POA: Diagnosis not present

## 2021-03-09 DIAGNOSIS — Z7984 Long term (current) use of oral hypoglycemic drugs: Secondary | ICD-10-CM | POA: Diagnosis not present

## 2021-03-09 DIAGNOSIS — M1990 Unspecified osteoarthritis, unspecified site: Secondary | ICD-10-CM | POA: Diagnosis not present

## 2021-03-12 ENCOUNTER — Other Ambulatory Visit: Payer: Self-pay | Admitting: Internal Medicine

## 2021-03-23 ENCOUNTER — Encounter: Payer: Self-pay | Admitting: Internal Medicine

## 2021-03-23 DIAGNOSIS — H40033 Anatomical narrow angle, bilateral: Secondary | ICD-10-CM | POA: Diagnosis not present

## 2021-03-23 DIAGNOSIS — E119 Type 2 diabetes mellitus without complications: Secondary | ICD-10-CM | POA: Diagnosis not present

## 2021-03-23 LAB — HM DIABETES EYE EXAM

## 2021-03-24 ENCOUNTER — Other Ambulatory Visit: Payer: Self-pay

## 2021-03-24 ENCOUNTER — Encounter: Payer: Self-pay | Admitting: Internal Medicine

## 2021-03-24 ENCOUNTER — Ambulatory Visit: Payer: Medicare PPO | Admitting: Internal Medicine

## 2021-03-24 VITALS — BP 122/80 | HR 60 | Temp 98.1°F | Ht 60.0 in | Wt 165.6 lb

## 2021-03-24 DIAGNOSIS — I129 Hypertensive chronic kidney disease with stage 1 through stage 4 chronic kidney disease, or unspecified chronic kidney disease: Secondary | ICD-10-CM

## 2021-03-24 DIAGNOSIS — N1831 Chronic kidney disease, stage 3a: Secondary | ICD-10-CM | POA: Diagnosis not present

## 2021-03-24 DIAGNOSIS — Z23 Encounter for immunization: Secondary | ICD-10-CM

## 2021-03-24 DIAGNOSIS — E1122 Type 2 diabetes mellitus with diabetic chronic kidney disease: Secondary | ICD-10-CM | POA: Diagnosis not present

## 2021-03-24 MED ORDER — ZOSTER VAC RECOMB ADJUVANTED 50 MCG/0.5ML IM SUSR: 0.5000 mL | Freq: Once | INTRAMUSCULAR | 0 refills | Status: AC

## 2021-03-24 NOTE — Patient Instructions (Signed)
Diabetes Mellitus and Nutrition, Adult When you have diabetes, or diabetes mellitus, it is very important to have healthy eating habits because your blood sugar (glucose) levels are greatly affected by what you eat and drink. Eating healthy foods in the right amounts, at about the same times every day, can help you:  Control your blood glucose.  Lower your risk of heart disease.  Improve your blood pressure.  Reach or maintain a healthy weight. What can affect my meal plan? Every person with diabetes is different, and each person has different needs for a meal plan. Your health care provider may recommend that you work with a dietitian to make a meal plan that is best for you. Your meal plan may vary depending on factors such as:  The calories you need.  The medicines you take.  Your weight.  Your blood glucose, blood pressure, and cholesterol levels.  Your activity level.  Other health conditions you have, such as heart or kidney disease. How do carbohydrates affect me? Carbohydrates, also called carbs, affect your blood glucose level more than any other type of food. Eating carbs naturally raises the amount of glucose in your blood. Carb counting is a method for keeping track of how many carbs you eat. Counting carbs is important to keep your blood glucose at a healthy level, especially if you use insulin or take certain oral diabetes medicines. It is important to know how many carbs you can safely have in each meal. This is different for every person. Your dietitian can help you calculate how many carbs you should have at each meal and for each snack. How does alcohol affect me? Alcohol can cause a sudden decrease in blood glucose (hypoglycemia), especially if you use insulin or take certain oral diabetes medicines. Hypoglycemia can be a life-threatening condition. Symptoms of hypoglycemia, such as sleepiness, dizziness, and confusion, are similar to symptoms of having too much  alcohol.  Do not drink alcohol if: ? Your health care provider tells you not to drink. ? You are pregnant, may be pregnant, or are planning to become pregnant.  If you drink alcohol: ? Do not drink on an empty stomach. ? Limit how much you use to:  0-1 drink a day for women.  0-2 drinks a day for men. ? Be aware of how much alcohol is in your drink. In the [place] one drink equals one 12 oz bottle of beer (355 mL), one 5 oz glass of wine (148 mL), or one 1 oz glass of hard liquor (44 mL). ? Keep yourself hydrated with water, diet soda, or unsweetened iced tea.  Keep in mind that regular soda, juice, and other mixers may contain a lot of sugar and must be counted as carbs. What are tips for following this plan? Reading food labels  Start by checking the serving size on the "Nutrition Facts" label of packaged foods and drinks. The amount of calories, carbs, fats, and other nutrients listed on the label is based on one serving of the item. Many items contain more than one serving per package.  Check the total grams (g) of carbs in one serving. You can calculate the number of servings of carbs in one serving by dividing the total carbs by 15. For example, if a food has 30 g of total carbs per serving, it would be equal to 2 servings of carbs.  Check the number of grams (g) of saturated fats and trans fats in one serving. Choose foods that have   a low amount or none of these fats.  Check the number of milligrams (mg) of salt (sodium) in one serving. Most people should limit total sodium intake to less than 2,300 mg per day.  Always check the nutrition information of foods labeled as "low-fat" or "nonfat." These foods may be higher in added sugar or refined carbs and should be avoided.  Talk to your dietitian to identify your daily goals for nutrients listed on the label. Shopping  Avoid buying canned, pre-made, or processed foods. These foods tend to be high in fat, sodium, and added  sugar.  Shop around the outside edge of the grocery store. This is where you will most often find fresh fruits and vegetables, bulk grains, fresh meats, and fresh dairy. Cooking  Use low-heat cooking methods, such as baking, instead of high-heat cooking methods like deep frying.  Cook using healthy oils, such as olive, canola, or sunflower oil.  Avoid cooking with butter, cream, or high-fat meats. Meal planning  Eat meals and snacks regularly, preferably at the same times every day. Avoid going long periods of time without eating.  Eat foods that are high in fiber, such as fresh fruits, vegetables, beans, and whole grains. Talk with your dietitian about how many servings of carbs you can eat at each meal.  Eat 4-6 oz (112-168 g) of lean protein each day, such as lean meat, chicken, fish, eggs, or tofu. One ounce (oz) of lean protein is equal to: ? 1 oz (28 g) of meat, chicken, or fish. ? 1 egg. ?  cup (62 g) of tofu.  Eat some foods each day that contain healthy fats, such as avocado, nuts, seeds, and fish.   What foods should I eat? Fruits Berries. Apples. Oranges. Peaches. Apricots. Plums. Grapes. Mango. Papaya. Pomegranate. Kiwi. Cherries. Vegetables Lettuce. Spinach. Leafy greens, including kale, chard, collard greens, and mustard greens. Beets. Cauliflower. Cabbage. Broccoli. Carrots. Green beans. Tomatoes. Peppers. Onions. Cucumbers. Brussels sprouts. Grains Whole grains, such as whole-wheat or whole-grain bread, crackers, tortillas, cereal, and pasta. Unsweetened oatmeal. Quinoa. Brown or wild rice. Meats and other proteins Seafood. Poultry without skin. Lean cuts of poultry and beef. Tofu. Nuts. Seeds. Dairy Low-fat or fat-free dairy products such as milk, yogurt, and cheese. The items listed above may not be a complete list of foods and beverages you can eat. Contact a dietitian for more information. What foods should I avoid? Fruits Fruits canned with  syrup. Vegetables Canned vegetables. Frozen vegetables with butter or cream sauce. Grains Refined white flour and flour products such as bread, pasta, snack foods, and cereals. Avoid all processed foods. Meats and other proteins Fatty cuts of meat. Poultry with skin. Breaded or fried meats. Processed meat. Avoid saturated fats. Dairy Full-fat yogurt, cheese, or milk. Beverages Sweetened drinks, such as soda or iced tea. The items listed above may not be a complete list of foods and beverages you should avoid. Contact a dietitian for more information. Questions to ask a health care provider  Do I need to meet with a diabetes educator?  Do I need to meet with a dietitian?  What number can I call if I have questions?  When are the best times to check my blood glucose? Where to find more information:  American Diabetes Association: diabetes.org  Academy of Nutrition and Dietetics: www.eatright.org  National Institute of Diabetes and Digestive and Kidney Diseases: www.niddk.nih.gov  Association of Diabetes Care and Education Specialists: www.diabeteseducator.org Summary  It is important to have healthy eating   habits because your blood sugar (glucose) levels are greatly affected by what you eat and drink.  A healthy meal plan will help you control your blood glucose and maintain a healthy lifestyle.  Your health care provider may recommend that you work with a dietitian to make a meal plan that is best for you.  Keep in mind that carbohydrates (carbs) and alcohol have immediate effects on your blood glucose levels. It is important to count carbs and to use alcohol carefully. This information is not intended to replace advice given to you by your health care provider. Make sure you discuss any questions you have with your health care provider. Document Revised: 05/15/2019 Document Reviewed: 05/15/2019 Elsevier Patient Education  2021 Elsevier Inc.  

## 2021-03-24 NOTE — Progress Notes (Signed)
Rich Brave [place],acting as a Education administrator for Maximino Greenland, MD.,have documented all relevant documentation on the behalf of Maximino Greenland, MD,as directed by  Maximino Greenland, MD while in the presence of Maximino Greenland, MD.  This visit occurred during the SARS-CoV-2 public health emergency.  Safety protocols were in place, including screening questions prior to the visit, additional usage of staff PPE, and extensive cleaning of exam room while observing appropriate contact time as indicated for disinfecting solutions.  Subjective:     Patient ID: Betty Arroyo , female    DOB: [birth year] , 75 y.o.   MRN: [id number]   Chief Complaint  Patient presents with   Diabetes   Hypertension    HPI  The patient is here today for a follow-up on her diabetes and blood pressure.  She reports compliance with meds. She has no specific concerns or complaints at this time.   Diabetes She presents for her follow-up diabetic visit. She has type 2 diabetes mellitus. Her disease course has been stable. Pertinent negatives for diabetes include no blurred vision, no chest pain and no fatigue. There are no hypoglycemic complications. Diabetic complications include nephropathy. Risk factors for coronary artery disease include diabetes mellitus, dyslipidemia, hypertension and post-menopausal. Her weight is stable. Her breakfast blood glucose is taken between 8-9 am. Her breakfast blood glucose range is generally 90-110 mg/dl. Her dinner blood glucose is taken between 5-6 pm. Her dinner blood glucose range is generally 130-140 mg/dl. An ACE inhibitor/angiotensin II receptor blocker is being taken.  Hypertension This is a chronic problem. The current episode started more than 1 year ago. The problem has been gradually improving since onset. The problem is controlled. Pertinent negatives include no blurred vision or chest pain. The current treatment provides moderate improvement. Compliance problems include exercise.   Hypertensive end-organ damage includes kidney disease.    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   Vertigo, benign paroxysmal      Family History  Problem Relation Age of Onset   Diabetes Mother    Hyperlipidemia Mother    Hypertension Mother    Varicose Veins Mother    Hyperlipidemia Brother    Hypertension Brother      Current Outpatient Medications:    carvedilol (COREG) 6.25 MG tablet, TAKE 2 TABLETS BY MOUTH TWICE DAILY WITH FOOD, Disp: 180 tablet, Rfl: 2   Chlorphen-PE-Acetaminophen (NOREL AD) 4-10-325 MG TABS, Take 1 tablet by mouth 2 times per day, Disp: 20 tablet, Rfl: 0   Cholecalciferol (VITAMIN D3) 50 MCG (2000 UT) capsule, Take 2,000 Units by mouth daily., Disp: , Rfl:    ezetimibe (ZETIA) 10 MG tablet, Take 1 tablet (10 mg total) by mouth daily., Disp: 90 tablet, Rfl: 1   JANUVIA 100 MG tablet, Take 100 mg by mouth every Monday, Wednesday, and Friday. , Disp: , Rfl:    meclizine (ANTIVERT) 25 MG tablet, Take 1 tablet (25 mg total) by mouth 3 (three) times daily as needed for dizziness., Disp: 30 tablet, Rfl: 0   rosuvastatin (CRESTOR) 40 MG tablet, Take 1 tablet by mouth once daily, Disp: 90 tablet, Rfl: 2   vitamin C (ASCORBIC ACID) 500 MG tablet, Take 500 mg by mouth daily., Disp: , Rfl:    Allergies  Allergen Reactions  Wilder Glade [Dapagliflozin] Rash     Review of Systems  Constitutional: Negative.  Negative for fatigue.  Eyes:  Negative for blurred vision.  Respiratory: Negative.    Cardiovascular: Negative.  Negative for chest pain.  Gastrointestinal: Negative.   Neurological: Negative.   Psychiatric/Behavioral: Negative.      Today's Vitals   03/24/21 1150  BP: 122/80  Pulse: 60  Temp: 98.1 F (36.7 C)  Weight: 165 lb 9.6 oz (75.1 kg)  Height: 5' (1.524 m)  PainSc: 0-No pain    Body mass index is 32.34 kg/m.  Wt Readings from Last 3 Encounters:  03/24/21 165 lb 9.6 oz (75.1 kg)  03/05/21 165 lb (74.8 kg)  12/29/20 165 lb 6.4 oz (75 kg)    Objective:  Physical Exam Vitals and nursing note reviewed.  Constitutional:      Appearance: Normal appearance.  HENT:     Head: Normocephalic and atraumatic.     Nose:     Comments: Masked     Mouth/Throat:     Comments: Masked  Eyes:     Extraocular Movements: Extraocular movements intact.  Cardiovascular:     Rate and Rhythm: Normal rate and regular rhythm.     Heart sounds: Normal heart sounds.  Pulmonary:     Effort: Pulmonary effort is normal.     Breath sounds: Normal breath sounds.  Musculoskeletal:     Cervical back: Normal range of motion.  Skin:    General: Skin is warm.  Neurological:     General: No focal deficit present.     Mental Status: She is alert.  Psychiatric:        Mood and Affect: Mood normal.        Behavior: Behavior normal.        Assessment And Plan:     1. Type 2 diabetes mellitus with stage 3a chronic kidney disease, without long-term current use of insulin (HCC) Comments: Chronic, I will check labs as listed below. We discussed use of Jardiance 21m daily. Will make further recommendations once labs are avail for review.  - BMP8+eGFR - Hemoglobin A1c  2. Parenchymal renal hypertension, stage 1 through stage 4 or unspecified chronic kidney disease Comments: Chronic, well controlled. She is encouraged to follow low sodium diet.   3. Immunization due Comments: I will send rx Shingrix to her local pharmacy.  - Zoster Vaccine Adjuvanted ([facility] injection; Inject 0.5 mLs into the muscle once for 1 dose.  Dispense: 0.5 mL; Refill: 0   Patient was given opportunity to ask questions. Patient verbalized understanding of the plan and was able to repeat key elements of the plan. All questions were answered to their satisfaction.   I, RMaximino Greenland MD, have reviewed all  documentation for this visit. The documentation on 03/24/21 for the exam, diagnosis, procedures, and orders are all accurate and complete. F YOU HAVE NOT HEARD FROM US/SPECIALIST IN TWO WEEKS, PLEASE GIVE [place] CALL AT [phone].   IF YOU HAVE BEEN REFERRED TO A SPECIALIST, IT MAY TAKE 1-2 WEEKS TO SCHEDULE/PROCESS THE REFERRAL. I  THE PATIENT IS ENCOURAGED TO PRACTICE SOCIAL DISTANCING DUE TO THE COVID-19 PANDEMIC.

## 2021-03-25 ENCOUNTER — Ambulatory Visit: Payer: Medicare PPO | Admitting: Internal Medicine

## 2021-03-25 LAB — HEMOGLOBIN A1C
Est. average glucose Bld gHb Est-mCnc: 180 mg/dL
Hgb A1c MFr Bld: 7.9 % — ABNORMAL HIGH (ref 4.8–5.6)

## 2021-03-25 LAB — BMP8+EGFR
BUN/Creatinine Ratio: 16 (ref 12–28)
BUN: 18 mg/dL (ref 8–27)
CO2: 23 mmol/L (ref 20–29)
Calcium: 9.2 mg/dL (ref 8.7–10.3)
Chloride: 104 mmol/L (ref 96–106)
Creatinine, Ser: 1.11 mg/dL — ABNORMAL HIGH (ref 0.57–1.00)
Glucose: 128 mg/dL — ABNORMAL HIGH (ref 70–99)
Potassium: 4.1 mmol/L (ref 3.5–5.2)
Sodium: 142 mmol/L (ref 134–144)
eGFR: 52 mL/min/{1.73_m2} — ABNORMAL LOW (ref >59)

## 2021-05-06 ENCOUNTER — Ambulatory Visit: Payer: Medicare PPO | Admitting: Internal Medicine

## 2021-05-11 ENCOUNTER — Ambulatory Visit: Payer: Medicare PPO | Admitting: Internal Medicine

## 2021-05-11 ENCOUNTER — Encounter: Payer: Self-pay | Admitting: Internal Medicine

## 2021-05-11 ENCOUNTER — Other Ambulatory Visit: Payer: Self-pay

## 2021-05-11 VITALS — BP 126/70 | HR 62 | Temp 98.0°F | Ht 60.0 in | Wt 165.0 lb

## 2021-05-11 DIAGNOSIS — N1831 Chronic kidney disease, stage 3a: Secondary | ICD-10-CM

## 2021-05-11 DIAGNOSIS — R0981 Nasal congestion: Secondary | ICD-10-CM

## 2021-05-11 DIAGNOSIS — E1122 Type 2 diabetes mellitus with diabetic chronic kidney disease: Secondary | ICD-10-CM | POA: Diagnosis not present

## 2021-05-11 DIAGNOSIS — J3489 Other specified disorders of nose and nasal sinuses: Secondary | ICD-10-CM

## 2021-05-11 DIAGNOSIS — B3731 Acute candidiasis of vulva and vagina: Secondary | ICD-10-CM | POA: Diagnosis not present

## 2021-05-11 LAB — POC INFLUENZA A&B (BINAX/QUICKVUE)
Influenza A, POC: NEGATIVE
Influenza B, POC: NEGATIVE

## 2021-05-11 LAB — POC COVID19 BINAXNOW: SARS Coronavirus 2 Ag: NEGATIVE

## 2021-05-11 MED ORDER — FLUCONAZOLE 150 MG PO TABS: ORAL_TABLET | ORAL | 0 refills | Status: DC

## 2021-05-11 NOTE — Patient Instructions (Signed)

## 2021-05-11 NOTE — Progress Notes (Signed)
I,Tianna Badgett,acting as a Education administrator for Maximino Greenland, MD.,have documented all relevant documentation on the behalf of Maximino Greenland, MD,as directed by  Maximino Greenland, MD while in the presence of Maximino Greenland, MD.  This visit occurred during the SARS-CoV-2 public health emergency.  Safety protocols were in place, including screening questions prior to the visit, additional usage of staff PPE, and extensive cleaning of exam room while observing appropriate contact time as indicated for disinfecting solutions.  Subjective:     Patient ID: Betty Arroyo , female    DOB: 05-19-1946 , 75 y.o.   MRN: [id number]   Chief Complaint  Patient presents with  . Diabetes    HPI  Patient is here for follow up on Jardiance. She was started on $RemoveBe'25mg'[id number]$  dose at her last visit. She denies having any issues with the medication. However, at end of visit, did mention having some vulvar/vaginal itching. She tried some OTC meds w/ some relief of her sx. Denies dysuria, vaginal discharge.   Diabetes She presents for her follow-up diabetic visit. She has type 2 diabetes mellitus. Her disease course has been stable. Pertinent negatives for diabetes include no fatigue. There are no hypoglycemic complications. Diabetic complications include nephropathy. Risk factors for coronary artery disease include diabetes mellitus, dyslipidemia, hypertension and post-menopausal. Her weight is stable. Her breakfast blood glucose is taken between 8-9 am. Her breakfast blood glucose range is generally 90-110 mg/dl. Her dinner blood glucose is taken between 5-6 pm. Her dinner blood glucose range is generally 130-140 mg/dl. An ACE inhibitor/angiotensin II receptor blocker is being taken.    Past Medical History:  Diagnosis Date  . Arthritis   . Chronic kidney disease   . Diabetes mellitus without complication ([place])   . Hypercholesteremia   . Hypertension   . 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 Vertigo, benign  paroxysmal      Family History  Problem Relation Age of Onset  . Diabetes Mother   . Hyperlipidemia Mother   . Hypertension Mother   . Varicose Veins Mother   . Hyperlipidemia Brother   . Hypertension Brother      Current Outpatient Medications:  .  carvedilol (COREG) 6.25 MG tablet, TAKE 2 TABLETS BY MOUTH TWICE DAILY WITH FOOD, Disp: 180 tablet, Rfl: 2 .  Chlorphen-PE-Acetaminophen (NOREL AD) 4-10-325 MG TABS, Take 1 tablet by mouth 2 times per day, Disp: 20 tablet, Rfl: 0 .  Cholecalciferol (VITAMIN D3) 50 MCG (2000 UT) capsule, Take 2,000 Units by mouth daily., Disp: , Rfl:  .  ezetimibe (ZETIA) 10 MG tablet, Take 1 tablet (10 mg total) by mouth daily., Disp: 90 tablet, Rfl: 1 .  fluconazole (DIFLUCAN) 150 MG tablet, Take one tab po today, repeat in 48 hours, Disp: 2 tablet, Rfl: 0 .  JANUVIA 100 MG tablet, Take 100 mg by mouth every Monday, Wednesday, and Friday. , Disp: , Rfl:  .  meclizine (ANTIVERT) 25 MG tablet, Take 1 tablet (25 mg total) by mouth 3 (three) times daily as needed for dizziness., Disp: 30 tablet, Rfl: 0 .  rosuvastatin (CRESTOR) 40 MG tablet, Take 1 tablet by mouth once daily, Disp: 90 tablet, Rfl: 2 .  vitamin C (ASCORBIC ACID) 500 MG tablet, Take 500 mg by mouth daily., Disp: , Rfl:    Allergies  Allergen Reactions  . Wilder Glade [Dapagliflozin] Rash     Review of Systems  Constitutional: Negative.  Negative for fatigue and fever.  HENT:  Positive for congestion and postnasal drip.   Respiratory: Negative.    Cardiovascular: Negative.   Gastrointestinal: Negative.   Genitourinary:  Negative for dysuria, flank pain and frequency.       She c/o vaginal itching.   Neurological: Negative.     Today's Vitals   05/11/21 1213  BP: 126/70  Pulse: 62  Temp: 98 F (36.7 C)  TempSrc: Oral  Weight: 165 lb (74.8 kg)  Height: 5' (1.524 m)   Body mass index is 32.22 kg/m.   Objective:  Physical Exam Vitals and nursing note reviewed.  Constitutional:       Appearance: Normal appearance.  HENT:     Head: Normocephalic and atraumatic.     Nose:     Comments: Masked     Mouth/Throat:     Comments: Masked  Eyes:     Extraocular Movements: Extraocular movements intact.  Cardiovascular:     Rate and Rhythm: Normal rate and regular rhythm.     Heart sounds: Normal heart sounds.  Pulmonary:     Effort: Pulmonary effort is normal.     Breath sounds: Normal breath sounds.  Musculoskeletal:     Cervical back: Normal range of motion.  Skin:    General: Skin is warm.  Neurological:     General: No focal deficit present.     Mental Status: She is alert.  Psychiatric:        Mood and Affect: Mood normal.        Behavior: Behavior normal.        Assessment And Plan:     1. Type 2 diabetes mellitus with stage 3a chronic kidney disease, without long-term current use of insulin (HCC) Comments: Chronic, she will c/w Jardiance $RemoveBefor'25mg'[id number]$  daily for now. I will check renal function today.  She is reminded to stay well hydrated.  - BMP8+EGFR  2. Nasal congestion with rhinorrhea Comments: Rapid COVID, f/u a/b are all negative. I will check COVID PCR. She was given samples of Zyrtec to take nightly prn. She will let me know if her sx persist.  - POC COVID-19 - POC Influenza A&B ([facility] test) - Novel Coronavirus, NAA ([facility])  3. Vaginal candidiasis Comments: I will send rx diflucan. She will let me know if her sx persist. Reminded this is possible AE Jardiance.    Patient was given opportunity to ask questions. Patient verbalized understanding of the plan and was able to repeat key elements of the plan. All questions were answered to their satisfaction.   I, Maximino Greenland, MD, have reviewed all documentation for this visit. The documentation on 05/11/21 for the exam, diagnosis, procedures, and orders are all accurate and complete.   IF YOU HAVE BEEN REFERRED TO A SPECIALIST, IT MAY TAKE 1-2 WEEKS TO SCHEDULE/PROCESS THE REFERRAL. IF YOU HAVE NOT  HEARD FROM US/SPECIALIST IN TWO WEEKS, PLEASE GIVE [place] A CALL AT [phone].   THE PATIENT IS ENCOURAGED TO PRACTICE SOCIAL DISTANCING DUE TO THE COVID-19 PANDEMIC.

## 2021-05-12 LAB — BMP8+EGFR
BUN/Creatinine Ratio: 19 (ref 12–28)
BUN: 20 mg/dL (ref 8–27)
CO2: 24 mmol/L (ref 20–29)
Calcium: 9.3 mg/dL (ref 8.7–10.3)
Chloride: 104 mmol/L (ref 96–106)
Creatinine, Ser: 1.03 mg/dL — ABNORMAL HIGH (ref 0.57–1.00)
Glucose: 89 mg/dL (ref 70–99)
Potassium: 4 mmol/L (ref 3.5–5.2)
Sodium: 142 mmol/L (ref 134–144)
eGFR: 57 mL/min/{1.73_m2} — ABNORMAL LOW (ref >59)

## 2021-05-12 LAB — SARS-COV-2, NAA 2 DAY TAT

## 2021-05-12 LAB — NOVEL CORONAVIRUS, NAA: SARS-CoV-2, NAA: NOT DETECTED

## 2021-05-19 DIAGNOSIS — M25552 Pain in left hip: Secondary | ICD-10-CM | POA: Diagnosis not present

## 2021-05-19 DIAGNOSIS — M25551 Pain in right hip: Secondary | ICD-10-CM | POA: Diagnosis not present

## 2021-06-16 ENCOUNTER — Other Ambulatory Visit: Payer: Self-pay

## 2021-06-16 MED ORDER — EZETIMIBE 10 MG PO TABS: 10.0000 mg | ORAL_TABLET | Freq: Every day | ORAL | 1 refills | Status: DC

## 2021-06-30 ENCOUNTER — Other Ambulatory Visit: Payer: Self-pay

## 2021-06-30 MED ORDER — CARVEDILOL 6.25 MG PO TABS: ORAL_TABLET | ORAL | 2 refills | Status: DC

## 2021-07-09 ENCOUNTER — Ambulatory Visit: Payer: Medicare PPO | Admitting: Internal Medicine

## 2021-07-13 ENCOUNTER — Other Ambulatory Visit: Payer: Self-pay | Admitting: Internal Medicine

## 2021-07-20 ENCOUNTER — Other Ambulatory Visit: Payer: Self-pay

## 2021-07-20 ENCOUNTER — Encounter: Payer: Self-pay | Admitting: Nurse Practitioner

## 2021-07-20 ENCOUNTER — Ambulatory Visit: Payer: Medicare PPO | Admitting: Nurse Practitioner

## 2021-07-20 VITALS — BP 136/70 | HR 66 | Temp 98.0°F | Ht 60.0 in | Wt 162.0 lb

## 2021-07-20 DIAGNOSIS — N1831 Chronic kidney disease, stage 3a: Secondary | ICD-10-CM

## 2021-07-20 DIAGNOSIS — I129 Hypertensive chronic kidney disease with stage 1 through stage 4 chronic kidney disease, or unspecified chronic kidney disease: Secondary | ICD-10-CM | POA: Diagnosis not present

## 2021-07-20 DIAGNOSIS — Z23 Encounter for immunization: Secondary | ICD-10-CM

## 2021-07-20 DIAGNOSIS — Z6831 Body mass index (BMI) 31.0-31.9, adult: Secondary | ICD-10-CM

## 2021-07-20 DIAGNOSIS — E6609 Other obesity due to excess calories: Secondary | ICD-10-CM

## 2021-07-20 DIAGNOSIS — E1122 Type 2 diabetes mellitus with diabetic chronic kidney disease: Secondary | ICD-10-CM

## 2021-07-20 DIAGNOSIS — R21 Rash and other nonspecific skin eruption: Secondary | ICD-10-CM | POA: Diagnosis not present

## 2021-07-20 MED ORDER — OZEMPIC (0.25 OR 0.5 MG/DOSE) 2 MG/1.5ML ~~LOC~~ SOPN: 0.2500 mg | PEN_INJECTOR | SUBCUTANEOUS | 1 refills | Status: DC

## 2021-07-20 MED ORDER — SHINGRIX 50 MCG/0.5ML IM SUSR: 0.5000 mL | Freq: Once | INTRAMUSCULAR | 0 refills | Status: AC

## 2021-07-20 MED ORDER — EMPAGLIFLOZIN 10 MG PO TABS: 10.0000 mg | ORAL_TABLET | Freq: Every day | ORAL | 1 refills | Status: DC

## 2021-07-20 MED ORDER — TRIAMCINOLONE ACETONIDE 0.1 % EX CREA: 1.0000 "application " | TOPICAL_CREAM | Freq: Two times a day (BID) | CUTANEOUS | 3 refills | Status: DC

## 2021-07-20 NOTE — Progress Notes (Signed)
I,Tianna Badgett,acting as a Education administrator for [employer], FNP.,have documented all relevant documentation on the behalf of Minette Brine, FNP,as directed by  Minette Brine, FNP while in the presence of Minette Brine, [place].  This visit occurred during the SARS-CoV-2 public health emergency.  Safety protocols were in place, including screening questions prior to the visit, additional usage of staff PPE, and extensive cleaning of exam room while observing appropriate contact time as indicated for disinfecting solutions.  Subjective:     Patient ID: Betty Arroyo , female    DOB: 01-10-1946 , 76 y.o.   MRN: [id number]   Chief Complaint  Patient presents with   Diabetes    HPI  Patient is here for follow up on diabetes. She is on [place] reports has been getting samples.   Diabetes She presents for her follow-up diabetic visit. She has type 2 diabetes mellitus. Her disease course has been stable. Pertinent negatives for diabetes include no fatigue. There are no hypoglycemic complications. Diabetic complications include nephropathy. Risk factors for coronary artery disease include diabetes mellitus, dyslipidemia, hypertension and post-menopausal. Her weight is stable. Her breakfast blood glucose is taken between 8-9 am. Her breakfast blood glucose range is generally 90-110 mg/dl. Her dinner blood glucose is taken between 5-6 pm. Her dinner blood glucose range is generally 130-140 mg/dl. An ACE inhibitor/angiotensin II receptor blocker is being taken.    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, Disp: , Rfl:    ezetimibe (ZETIA) 10 MG tablet, Take 1 tablet (10 mg total) by mouth daily., Disp: 90 tablet, Rfl: 1   meclizine (ANTIVERT) 25 MG tablet, Take 1 tablet (25 mg total) by mouth 3 (three) times daily as needed for dizziness., Disp: 30 tablet, Rfl: 0   rosuvastatin (CRESTOR) 40 MG tablet, Take 1 tablet by mouth once daily, Disp: 90 tablet, Rfl: 0   triamcinolone cream (KENALOG) 0.1 %, Apply 1 application topically 2 (two) times daily., Disp: 30 g, Rfl: 3   vitamin C (ASCORBIC ACID) 500 MG tablet, Take 500 mg by mouth daily., Disp: , Rfl:    Zoster Vaccine Adjuvanted (SHINGRIX) injection, Inject 0.5 mLs into the muscle once for 1 dose., Disp: 0.5 mL, Rfl: 0   empagliflozin (JARDIANCE) 10 MG TABS tablet, Take 1 tablet (10 mg total) by mouth daily., Disp: 90 tablet, Rfl: 1   Semaglutide,0.25 or 0.5MG/DOS, (OZEMPIC, 0.25 OR 0.5 MG/DOSE,) 2 MG/1.5ML SOPN, Inject 0.25 mg into the skin once a week., Disp: 4.5 mL, Rfl: 1   Allergies  Allergen Reactions   Farxiga [Dapagliflozin] Rash     Review of  Systems  Constitutional: Negative.  Negative for fatigue.  Respiratory: Negative.    Cardiovascular: Negative.   Gastrointestinal: Negative.   Neurological: Negative.     Today's Vitals   07/20/21 1158  BP: 136/70  Pulse: 66  Temp: 98 F (36.7 C)  TempSrc: Oral  Weight: 162 lb (73.5 kg)  Height: 5' (1.524 m)   Body mass index is 31.64 kg/m.  Wt Readings from Last 3 Encounters:  07/20/21 162 lb (73.5 kg)  05/11/21 165 lb (74.8 kg)  03/24/21 165 lb 9.6 oz (75.1 kg)    Objective:  Physical Exam Vitals reviewed.   Constitutional:      General: She is not in acute distress.    Appearance: Normal appearance. She is well-developed. She is obese.  Cardiovascular:     Rate and Rhythm: Normal rate and regular rhythm.     Pulses: Normal pulses.     Heart sounds: Normal heart sounds. No murmur heard. Pulmonary:     Effort: Pulmonary effort is normal. No respiratory distress.     Breath sounds: Normal breath sounds. No wheezing.  Chest:     Chest wall: No tenderness.  Musculoskeletal:        General: Normal range of motion.  Skin:    General: Skin is warm and dry.     Capillary Refill: Capillary refill takes less than 2 seconds.     Findings: Rash (she has areas to her skin that are hyperpigmented, seen on right lower extremity and left inner elbow.) present.  Neurological:     General: No focal deficit present.     Mental Status: She is alert and oriented to person, place, and time.     Cranial Nerves: No cranial nerve deficit.     Motor: No weakness.  Psychiatric:        Mood and Affect: Mood normal.        Behavior: Behavior normal.        Thought Content: Thought content normal.        Judgment: Judgment normal.        Assessment And Plan:     1. Type 2 diabetes mellitus with stage 3a chronic kidney disease, without long-term current use of insulin ([place]) Comments: HgbA1c was elevated at last visit and she has not been taking the Jardiance daily due to having samples. Will start Ozempic 0.25 mg and will continue until her follow up in 8 weeks.  - Hemoglobin A1c - BMP8+EGFR - empagliflozin (JARDIANCE) 10 MG TABS tablet; Take 1 tablet (10 mg total) by mouth daily.  Dispense: 90 tablet; Refill: 1 - Semaglutide,0.25 or 0.5MG/DOS, (OZEMPIC, 0.25 OR 0.5 MG/DOSE,) 2 MG/1.5ML SOPN; Inject 0.25 mg into the skin once a week.  Dispense: 4.5 mL; Refill: 1  2. Parenchymal renal hypertension, stage 1 through stage 4 or unspecified chronic kidney disease Comments: Blood pressure is fairly controlled.  Continue current medications.   3. Rash Comments: She has several areas of hyperpigmentation to inner left elbow and right lower extremity, may be eczema vs allergic reaction to medication. Triamcinolone has been effective in the past but has ran out of the medication - triamcinolone cream (KENALOG) 0.1 %; Apply 1 application topically 2 (two) times daily.  Dispense: 30 g; Refill: 3  4. Class 1 obesity due to excess calories with serious comorbidity and body mass index (BMI) of 31.0 to 31.9 in adult Chronic Discussed healthy diet and regular exercise options  Encouraged to exercise at least 150 minutes per week  with 2 days of strength training She is encouraged to strive for BMI less than 30 to decrease cardiac risk. 5. Encounter for immunization Rx sent to pharmacy - Zoster Vaccine Adjuvanted [facility]) injection; Inject 0.5 mLs into the muscle once for 1 dose.  Dispense: 0.5 mL; Refill: 0   Patient was given opportunity to ask questions. Patient verbalized understanding of the plan and was able to repeat key elements of the plan. All questions were answered to their satisfaction.  Minette Brine, FNP   I, Minette Brine, FNP, have reviewed all documentation for this visit. The documentation on 07/20/21 for the exam, diagnosis, procedures, and orders are all accurate and complete.   IF YOU HAVE BEEN REFERRED TO A SPECIALIST, IT MAY TAKE 1-2 WEEKS TO SCHEDULE/PROCESS THE REFERRAL. IF YOU HAVE NOT HEARD FROM US/SPECIALIST IN TWO WEEKS, PLEASE GIVE [place] A CALL AT [phone] X [phone].   THE PATIENT IS ENCOURAGED TO PRACTICE SOCIAL DISTANCING DUE TO THE COVID-19 PANDEMIC.

## 2021-07-20 NOTE — Patient Instructions (Signed)

## 2021-07-21 LAB — HEMOGLOBIN A1C
Est. average glucose Bld gHb Est-mCnc: 157 mg/dL
Hgb A1c MFr Bld: 7.1 % — ABNORMAL HIGH (ref 4.8–5.6)

## 2021-07-21 LAB — BMP8+EGFR
BUN/Creatinine Ratio: 14 (ref 12–28)
BUN: 13 mg/dL (ref 8–27)
CO2: 23 mmol/L (ref 20–29)
Calcium: 9.5 mg/dL (ref 8.7–10.3)
Chloride: 106 mmol/L (ref 96–106)
Creatinine, Ser: 0.96 mg/dL (ref 0.57–1.00)
Glucose: 81 mg/dL (ref 70–99)
Potassium: 3.8 mmol/L (ref 3.5–5.2)
Sodium: 146 mmol/L — ABNORMAL HIGH (ref 134–144)
eGFR: 62 mL/min/{1.73_m2} (ref >59)

## 2021-07-26 IMAGING — CR CHEST - 2 VIEW
2 series · 2 of 2 positions shown · non-contrast
Comparison: None.

CLINICAL DATA: Preoperative knee replacement.  Hypertension.

EXAM:
CHEST - 2 VIEW

[w chest pa]
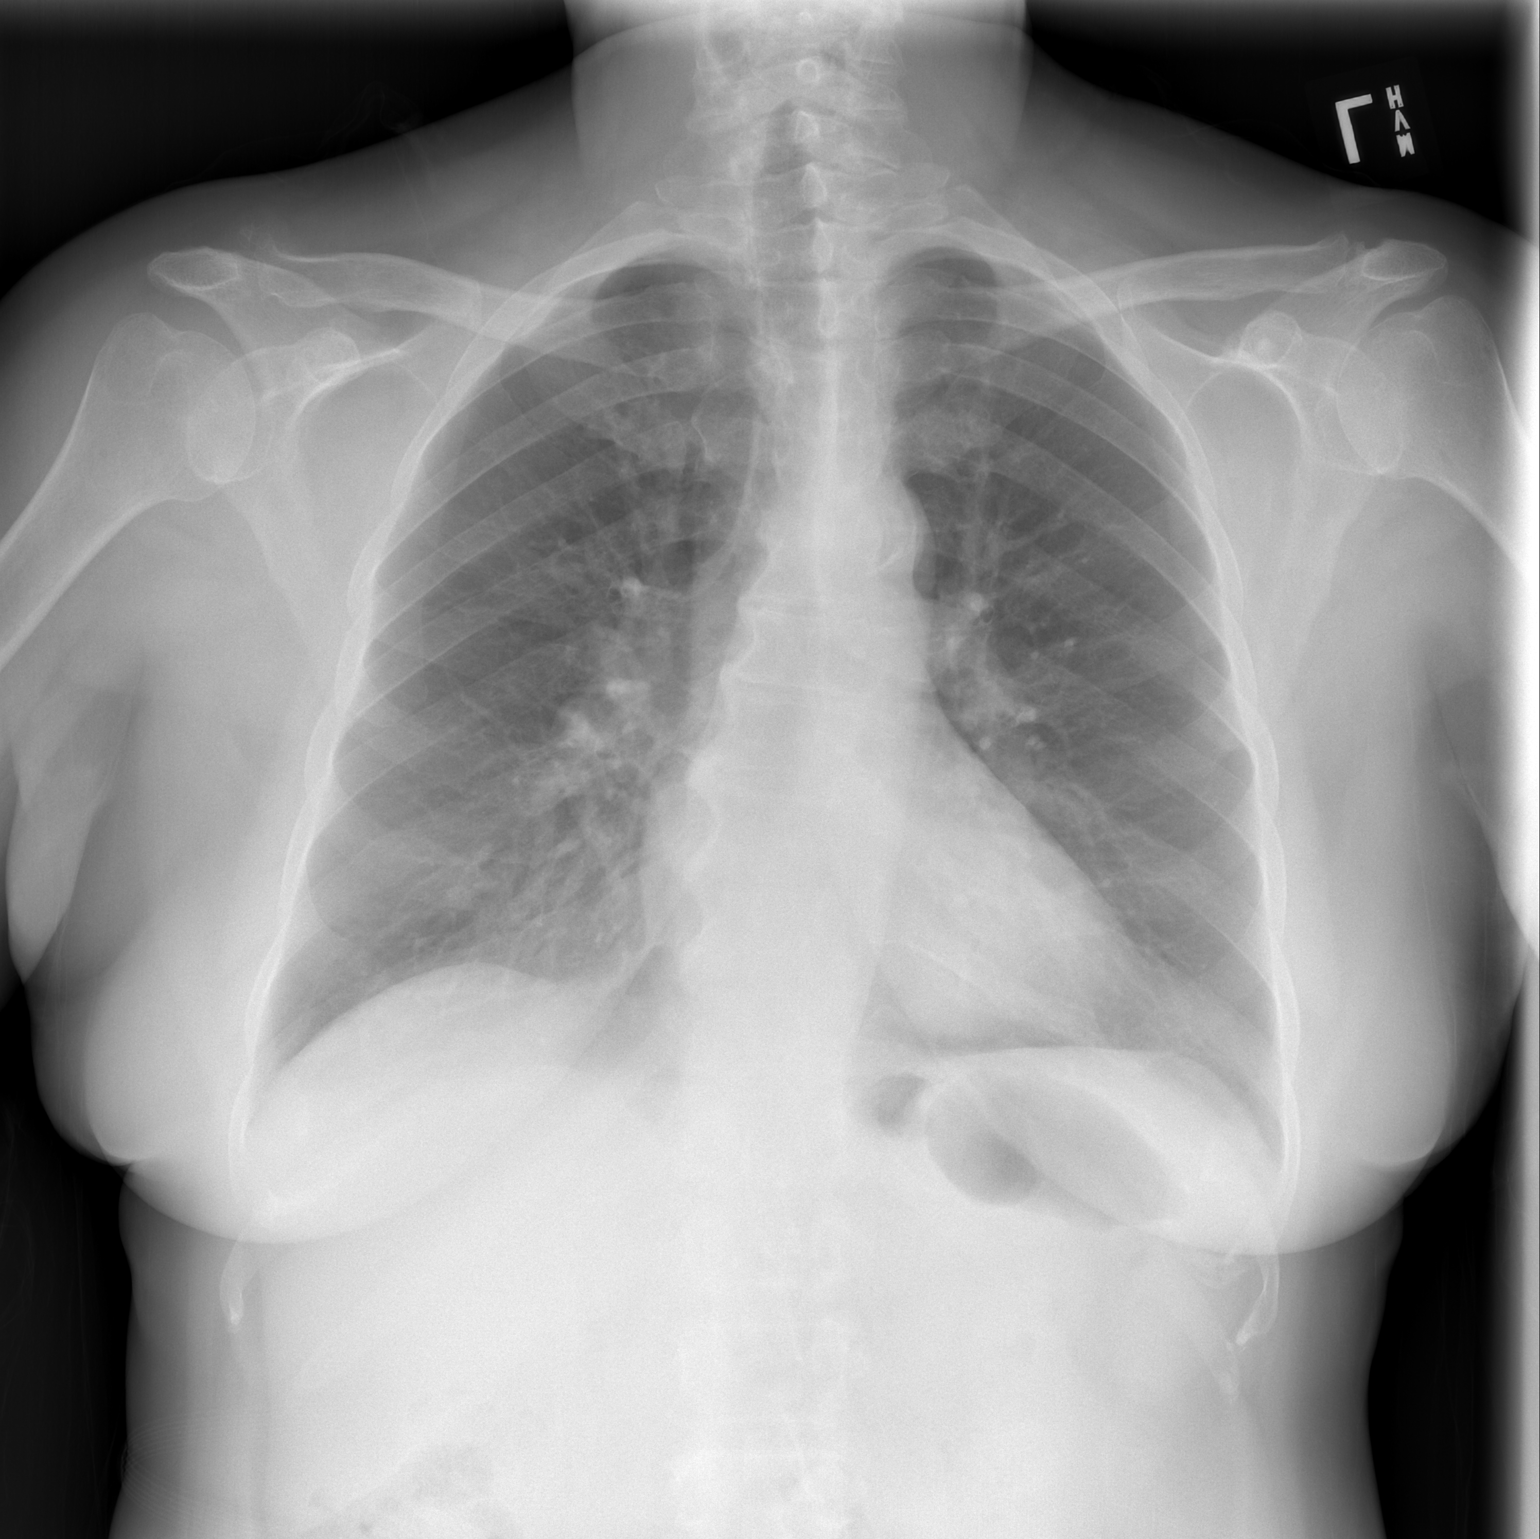

[w chest lat]
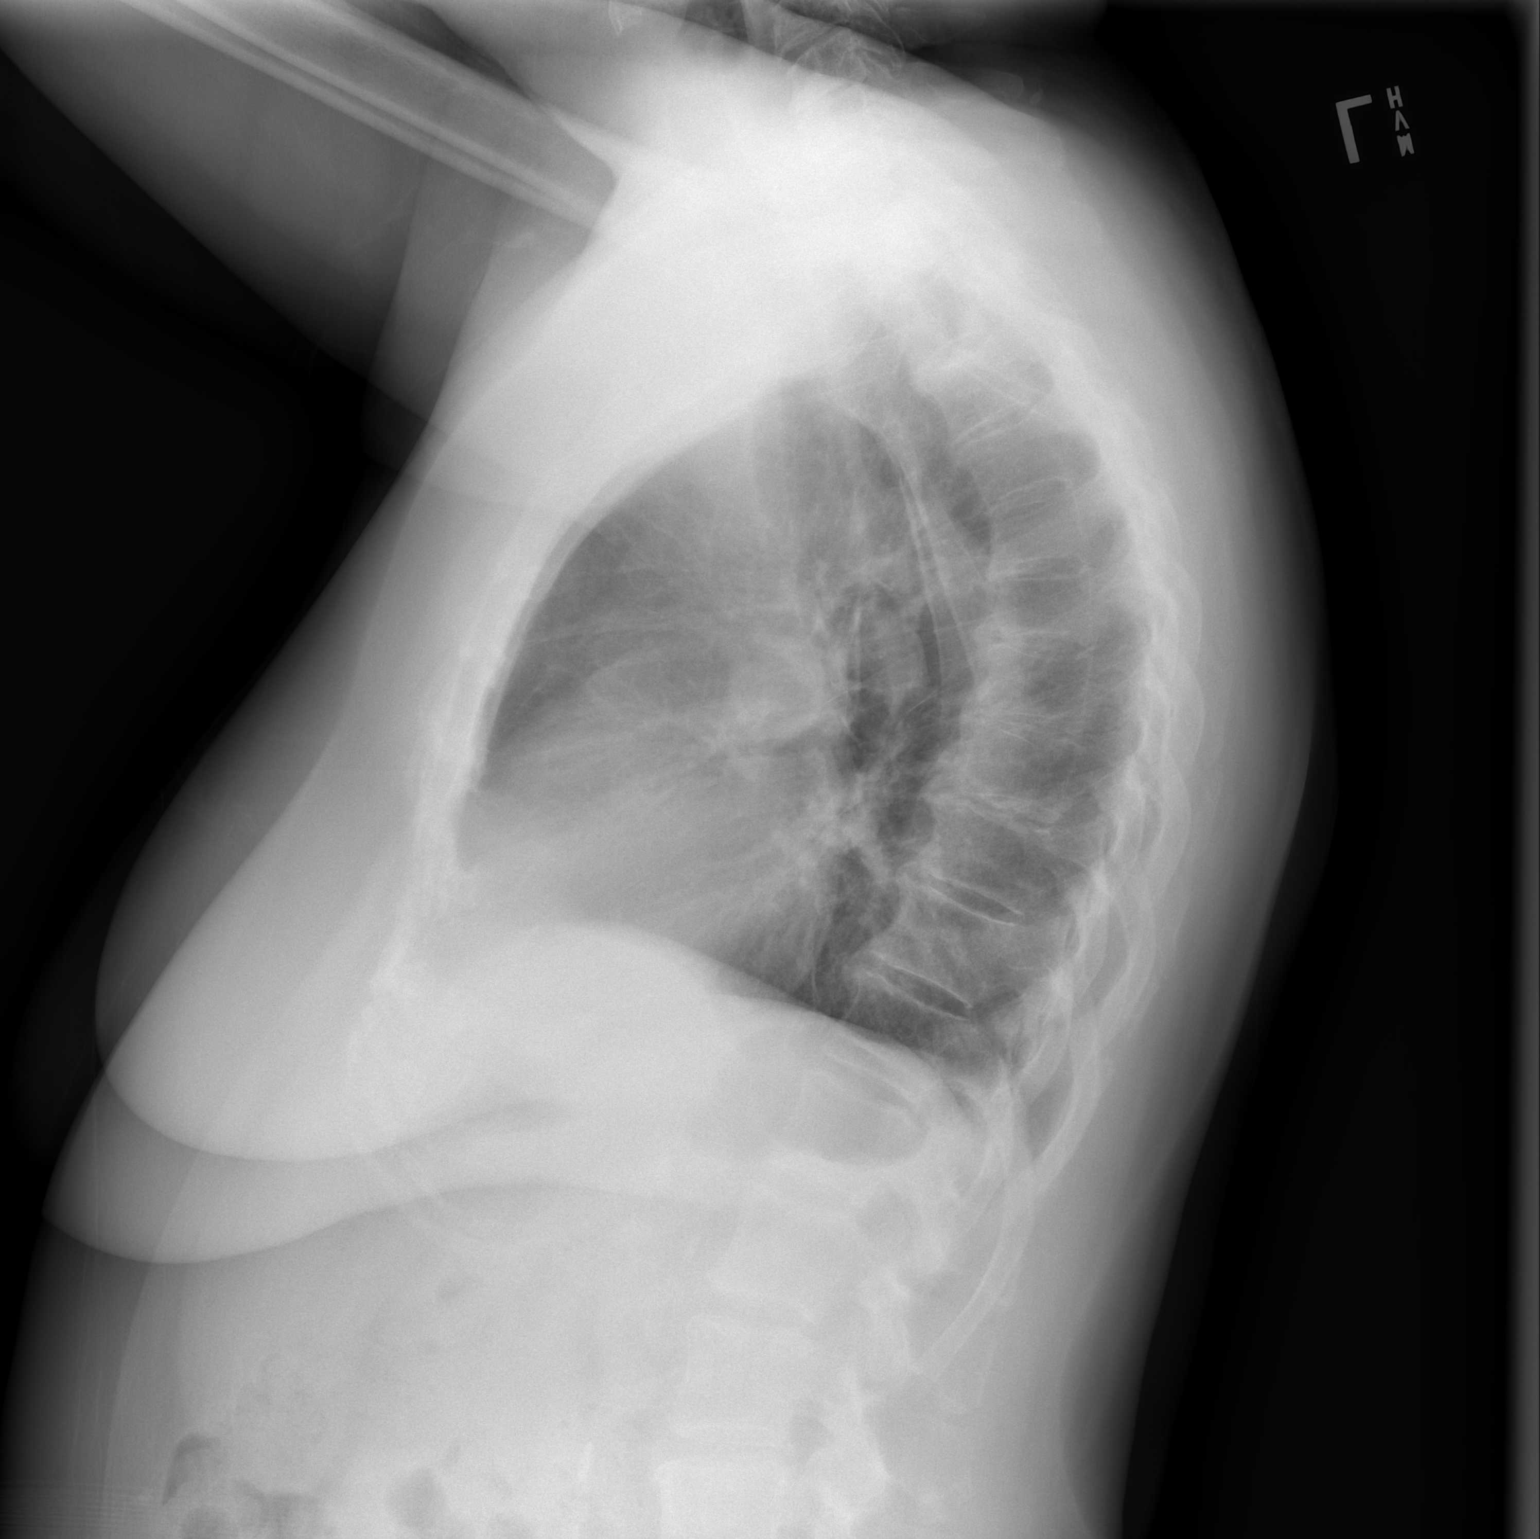

[2 of 2 positions shown; findings below may reference images not displayed]

FINDINGS: Lungs are clear. Heart size and pulmonary vascularity are normal. No
adenopathy. There is aortic atherosclerosis. No pneumothorax. There
is degenerative change in the thoracic spine.
IMPRESSION: No edema or consolidation. Heart size normal. Aortic Atherosclerosis
([id number]).

## 2021-09-30 ENCOUNTER — Encounter: Payer: Self-pay | Admitting: Internal Medicine

## 2021-09-30 ENCOUNTER — Ambulatory Visit: Payer: Medicare PPO | Admitting: Internal Medicine

## 2021-09-30 VITALS — BP 122/70 | HR 76 | Temp 97.8°F | Ht 60.0 in | Wt 157.6 lb

## 2021-09-30 DIAGNOSIS — E6609 Other obesity due to excess calories: Secondary | ICD-10-CM | POA: Diagnosis not present

## 2021-09-30 DIAGNOSIS — Z6830 Body mass index (BMI) 30.0-30.9, adult: Secondary | ICD-10-CM | POA: Diagnosis not present

## 2021-09-30 DIAGNOSIS — I129 Hypertensive chronic kidney disease with stage 1 through stage 4 chronic kidney disease, or unspecified chronic kidney disease: Secondary | ICD-10-CM | POA: Diagnosis not present

## 2021-09-30 DIAGNOSIS — N1831 Chronic kidney disease, stage 3a: Secondary | ICD-10-CM | POA: Diagnosis not present

## 2021-09-30 DIAGNOSIS — E1122 Type 2 diabetes mellitus with diabetic chronic kidney disease: Secondary | ICD-10-CM | POA: Diagnosis not present

## 2021-09-30 DIAGNOSIS — N898 Other specified noninflammatory disorders of vagina: Secondary | ICD-10-CM | POA: Diagnosis not present

## 2021-09-30 DIAGNOSIS — E7800 Pure hypercholesterolemia, unspecified: Secondary | ICD-10-CM | POA: Diagnosis not present

## 2021-09-30 MED ORDER — ROSUVASTATIN CALCIUM 40 MG PO TABS: 40.0000 mg | ORAL_TABLET | Freq: Every day | ORAL | 2 refills | Status: DC

## 2021-09-30 MED ORDER — OZEMPIC (0.25 OR 0.5 MG/DOSE) 2 MG/1.5ML ~~LOC~~ SOPN: 0.2500 mg | PEN_INJECTOR | SUBCUTANEOUS | 1 refills | Status: DC

## 2021-09-30 NOTE — Progress Notes (Signed)
?Rich Brave [place],acting as a Education administrator for Maximino Greenland, MD.,have documented all relevant documentation on the behalf of Maximino Greenland, MD,as directed by  Maximino Greenland, MD while in the presence of Maximino Greenland, MD.  ?This visit occurred during the SARS-CoV-2 public health emergency.  Safety protocols were in place, including screening questions prior to the visit, additional usage of staff PPE, and extensive cleaning of exam room while observing appropriate contact time as indicated for disinfecting solutions. ? ?Subjective:  ?  ? Patient ID: Betty Arroyo , female    DOB: 1946-06-10 , 76 y.o.   MRN: [id number] ? ? ?Chief Complaint  ?Patient presents with  ? Diabetes  ? ? ?HPI ? ?Patient presents today for a bp and diabetes check. She was started on Ozempic, weekly at her last visit. She is now on 0.5mg  once weekly.  Patient reports compliance with her meds. Patient does not have any questions or concerns at this time. ? ?Diabetes ?She presents for her follow-up diabetic visit. She has type 2 diabetes mellitus. Her disease course has been stable. Pertinent negatives for diabetes include no fatigue, no polydipsia, no polyphagia and no polyuria. There are no hypoglycemic complications. Diabetic complications include nephropathy. Risk factors for coronary artery disease include diabetes mellitus, dyslipidemia, hypertension and post-menopausal. Her weight is stable. Her breakfast blood glucose is taken between 8-9 am. Her breakfast blood glucose range is generally 90-110 mg/dl. Her dinner blood glucose is taken between 5-6 pm. Her dinner blood glucose range is generally 130-140 mg/dl. An ACE inhibitor/angiotensin II receptor blocker is being taken.   ? ?Past Medical History:  ?Diagnosis Date  ? Arthritis   ? Chronic kidney disease   ? Diabetes mellitus without complication ([place])   ? Hypercholesteremia   ? Hypertension   ? 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 Right > left leg  ? Vertigo, benign paroxysmal   ?   ? ?Family History  ?Problem Relation Age of Onset  ? Diabetes Mother   ? Hyperlipidemia Mother   ? Hypertension Mother   ? Varicose Veins Mother   ? Hyperlipidemia Brother   ? Hypertension Brother   ? ? ? ?Current Outpatient Medications:  ?  carvedilol (COREG) 6.25 MG tablet, TAKE 2 TABLETS BY MOUTH TWICE DAILY WITH FOOD, Disp: 180 tablet, Rfl: 2 ?  Cholecalciferol (VITAMIN D3) 50 MCG (2000 UT) capsule, Take 2,000 Units by mouth daily., Disp: , Rfl:  ?  meclizine (ANTIVERT) 25 MG tablet, Take 1 tablet (25 mg total) by mouth 3 (three) times daily as needed for dizziness., Disp: 30 tablet, Rfl: 0 ?  triamcinolone cream (KENALOG) 0.1 %, Apply 1 application topically 2 (two) times daily., Disp: 30 g, Rfl: 3 ?  vitamin C (ASCORBIC ACID) 500 MG tablet, Take 500 mg by mouth daily., Disp: , Rfl:  ?  rosuvastatin (CRESTOR) 40 MG tablet, Take 1 tablet (40 mg total) by mouth daily., Disp: 90 tablet, Rfl: 2 ?  Semaglutide,0.25 or 0.5MG /DOS, (OZEMPIC, 0.25 OR 0.5 MG/DOSE,) 2 MG/1.5ML SOPN, Inject 0.25 mg into the skin once a week., Disp: 4.5 mL, Rfl: 1  ? ?Allergies  ?Allergen Reactions  ? Wilder Glade [Dapagliflozin] Rash  ?  ? ?Review of Systems  ?Constitutional: Negative.  Negative for fatigue.  ?Respiratory: Negative.    ?Cardiovascular: Negative.   ?Gastrointestinal: Negative.   ?Endocrine: Negative for polydipsia, polyphagia and polyuria.  ?Neurological: Negative.   ?Psychiatric/Behavioral: Negative.     ? ?Today's Vitals  ? 09/30/21 1607  ?BP:  122/70  ?Pulse: 76  ?Temp: 97.8 ?F (36.6 ?C)  ?Weight: 157 lb 9.6 oz (71.5 kg)  ?Height: 5' (1.524 m)  ?PainSc: 0-No pain  ? ?Body mass index is 30.78 kg/m?.  ?Wt Readings from Last 3 Encounters:  ?09/30/21 157 lb 9.6 oz (71.5 kg)  ?07/20/21 162 lb (73.5 kg)  ?05/11/21 165 lb (74.8 kg)  ?  ? ?Objective:  ?Physical Exam ?Vitals and nursing note reviewed.  ?Constitutional:   ?   Appearance: Normal appearance.  ?HENT:  ?   Head: Normocephalic and atraumatic.  ?Eyes:  ?   Extraocular  Movements: Extraocular movements intact.  ?Cardiovascular:  ?   Rate and Rhythm: Normal rate and regular rhythm.  ?   Heart sounds: Normal heart sounds.  ?Pulmonary:  ?   Effort: Pulmonary effort is normal.  ?   Breath sounds: Normal breath sounds.  ?Musculoskeletal:  ?   Cervical back: Normal range of motion.  ?Skin: ?   General: Skin is warm.  ?Neurological:  ?   General: No focal deficit present.  ?   Mental Status: She is alert.  ?Psychiatric:     ?   Mood and Affect: Mood normal.     ?   Behavior: Behavior normal.  ?  ? ?   ?Assessment And Plan:  ?   ?1. Type 2 diabetes mellitus with stage 3a chronic kidney disease, without long-term current use of insulin ([place]) ?Comments: Chronic, I will check labs as below. She will c/w Ozempic, 0.5mg  weekly.  ?- Urine Albumin-Creatinine with uACR ?- Hemoglobin A1c ?- Semaglutide,0.25 or 0.5MG /DOS, (OZEMPIC, 0.25 OR 0.5 MG/DOSE,) 2 MG/1.5ML SOPN; Inject 0.25 mg into the skin once a week.  Dispense: 4.5 mL; Refill: 1 ? ?2. Parenchymal renal hypertension, stage 1 through stage 4 or unspecified chronic kidney disease ?Comments: Chronic, well controlled. She will c/w carvedilol BID.  ?- CMP14+EGFR ? ?3. Vaginal itching ?Comments: I will check urinalysis, send-off. If yeast present, I will renew fluconazole. She has received two rounds since starting Jardiance.  ?- Urinalysis, Complete (81001) ? ?4. Pure hypercholesterolemia ?Comments: Chronic, rx Crestor 40mg  daily was sent to her pharmacy. ?- rosuvastatin (CRESTOR) 40 MG tablet; Take 1 tablet (40 mg total) by mouth daily.  Dispense: 90 tablet; Refill: 2 ? ?5. Class 1 obesity due to excess calories with serious comorbidity and body mass index (BMI) of 30.0 to 30.9 in adult ?Comments: She is encouraged to aim for at least 150 minutes of exercise per week. BMI is acceptable for her demographics.  ?  ?Patient was given opportunity to ask questions. Patient verbalized understanding of the plan and was able to repeat key elements of  the plan. All questions were answered to their satisfaction.  ? ?I, Maximino Greenland, MD, have reviewed all documentation for this visit. The documentation on 09/30/21 for the exam, diagnosis, procedures, and orders are all accurate and complete.  ? ?IF YOU HAVE BEEN REFERRED TO A SPECIALIST, IT MAY TAKE 1-2 WEEKS TO SCHEDULE/PROCESS THE REFERRAL. IF YOU HAVE NOT HEARD FROM US/SPECIALIST IN TWO WEEKS, PLEASE GIVE [place] A CALL AT [phone].  ? ?THE PATIENT IS ENCOURAGED TO PRACTICE SOCIAL DISTANCING DUE TO THE COVID-19 PANDEMIC.   ?

## 2021-09-30 NOTE — Patient Instructions (Signed)

## 2021-10-02 LAB — CMP14+EGFR
ALT: 14 IU/L (ref 0–32)
AST: 19 IU/L (ref 0–40)
Albumin/Globulin Ratio: 2 (ref 1.2–2.2)
Albumin: 4.5 g/dL (ref 3.7–4.7)
Alkaline Phosphatase: 99 IU/L (ref 44–121)
BUN/Creatinine Ratio: 17 (ref 12–28)
BUN: 18 mg/dL (ref 8–27)
Bilirubin Total: 0.7 mg/dL (ref 0.0–1.2)
CO2: 23 mmol/L (ref 20–29)
Calcium: 9.8 mg/dL (ref 8.7–10.3)
Chloride: 105 mmol/L (ref 96–106)
Creatinine, Ser: 1.06 mg/dL — ABNORMAL HIGH (ref 0.57–1.00)
Globulin, Total: 2.2 g/dL (ref 1.5–4.5)
Glucose: 79 mg/dL (ref 70–99)
Potassium: 3.7 mmol/L (ref 3.5–5.2)
Sodium: 141 mmol/L (ref 134–144)
Total Protein: 6.7 g/dL (ref 6.0–8.5)
eGFR: 55 mL/min/{1.73_m2} — ABNORMAL LOW (ref >59)

## 2021-10-02 LAB — MICROALBUMIN / CREATININE URINE RATIO
Creatinine, Urine: 74.6 mg/dL
Microalb/Creat Ratio: 305 mg/g creat — ABNORMAL HIGH (ref 0–29)
Microalbumin, Urine: 227.2 ug/mL

## 2021-10-02 LAB — HEMOGLOBIN A1C
Est. average glucose Bld gHb Est-mCnc: 143 mg/dL
Hgb A1c MFr Bld: 6.6 % — ABNORMAL HIGH (ref 4.8–5.6)

## 2021-10-02 LAB — URINALYSIS, COMPLETE
Bilirubin, UA: NEGATIVE
Ketones, UA: NEGATIVE
Leukocytes,UA: NEGATIVE
Nitrite, UA: NEGATIVE
Specific Gravity, UA: >=1.03 — AB (ref 1.005–1.030)
Urobilinogen, Ur: 1 mg/dL (ref 0.2–1.0)
pH, UA: 6 (ref 5.0–7.5)

## 2021-10-02 LAB — MICROSCOPIC EXAMINATION
Bacteria, UA: NONE SEEN
Casts: NONE SEEN /lpf

## 2021-10-21 ENCOUNTER — Ambulatory Visit (INDEPENDENT_AMBULATORY_CARE_PROVIDER_SITE_OTHER): Payer: Medicare PPO

## 2021-10-21 ENCOUNTER — Ambulatory Visit: Payer: Medicare PPO | Admitting: Podiatry

## 2021-10-21 DIAGNOSIS — M19072 Primary osteoarthritis, left ankle and foot: Secondary | ICD-10-CM

## 2021-10-21 DIAGNOSIS — Z01818 Encounter for other preprocedural examination: Secondary | ICD-10-CM | POA: Diagnosis not present

## 2021-10-21 DIAGNOSIS — M19071 Primary osteoarthritis, right ankle and foot: Secondary | ICD-10-CM | POA: Diagnosis not present

## 2021-10-21 DIAGNOSIS — M79671 Pain in right foot: Secondary | ICD-10-CM

## 2021-10-27 NOTE — Progress Notes (Signed)
?Subjective:  ?Patient ID: Betty Arroyo, female    DOB: Oct 20, 1945,  MRN: [id number] ? ?No chief complaint on file. ? ? ?76 y.o. female presents with the above complaint.  Patient presents with complaint of bilateral first metatarsophalangeal joint pain left greater than right side.  She states it hurts with ambulation and hurts in the big toe hurts with pressure.  She wanted to get it evaluated she has not seen anyone else prior to seeing me.  She states she started shoe gear modification padding protective none of which has helped.  She is tried taking pressure off of it that gives her temporary relief but then it comes back again.  She would like to discuss surgical options at this time as she has failed all conservative treatment options pain scale is 8 out of 10 hurts with ambulation. ? ? ?Review of Systems: Negative except as noted in the HPI. Denies N/V/F/Ch. ? ?Past Medical History:  ?Diagnosis Date  ? Arthritis   ? Chronic kidney disease   ? Diabetes mellitus without complication (HCC)   ? Hypercholesteremia   ? Hypertension   ? 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 Right > left leg  ? Vertigo, benign paroxysmal   ? ? ?Current Outpatient Medications:  ?  carvedilol (COREG) 6.25 MG tablet, TAKE 2 TABLETS BY MOUTH TWICE DAILY WITH FOOD, Disp: 180 tablet, Rfl: 2 ?  Cholecalciferol (VITAMIN D3) 50 MCG (2000 UT) capsule, Take 2,000 Units by mouth daily., Disp: , Rfl:  ?  meclizine (ANTIVERT) 25 MG tablet, Take 1 tablet (25 mg total) by mouth 3 (three) times daily as needed for dizziness., Disp: 30 tablet, Rfl: 0 ?  rosuvastatin (CRESTOR) 40 MG tablet, Take 1 tablet (40 mg total) by mouth daily., Disp: 90 tablet, Rfl: 2 ?  Semaglutide,0.25 or 0.5MG /DOS, (OZEMPIC, 0.25 OR 0.5 MG/DOSE,) 2 MG/1.5ML SOPN, Inject 0.25 mg into the skin once a week., Disp: 4.5 mL, Rfl: 1 ?  triamcinolone cream (KENALOG) 0.1 %, Apply 1 application topically 2 (two) times daily., Disp: 30 g, Rfl: 3 ?  vitamin C (ASCORBIC ACID) 500 MG tablet, Take  500 mg by mouth daily., Disp: , Rfl:  ? ?Social History  ? ?Tobacco Use  ?Smoking Status Never  ?Smokeless Tobacco Never  ? ? ?Allergies  ?Allergen Reactions  ? Marcelline Deist [Dapagliflozin] Rash  ? ?Objective:  ?There were no vitals filed for this visit. ?There is no height or weight on file to calculate BMI. ?Constitutional Well developed. ?Well nourished.  ?Vascular Dorsalis pedis pulses palpable bilaterally. ?Posterior tibial pulses palpable bilaterally. ?Capillary refill normal to all digits.  ?No cyanosis or clubbing noted. ?Pedal hair growth normal.  ?Neurologic Normal speech. ?Oriented to person, place, and time. ?Epicritic sensation to light touch grossly present bilaterally.  ?Dermatologic Nails well groomed and normal in appearance. ?No open wounds. ?No skin lesions.  ?Orthopedic: Pain on palpation bilateral first metatarsophalangeal joint pain with the range of motion of the joint deep intra-articular pain noted to bilateral first MTP left slightly greater than right side.  Bone-on-bone erosion noted crepitus noted.  Hallux rigidus noted to both metatarsophalangeal joint.  ? ?Radiographs: I will 3 views of skeletally mature adult bilateral first metatarsophalangeal joint.  Previous hardware noted.  From a previous bunionectomy.  Arthritis noted to the first metatarsophalangeal joint with uneven joint space narrowing with osteophytes and bony Robert abnormality.  It is a bone-on-bone erosion.  Left slightly greater than right side ?Assessment:  ? ?1. Arthritis of first metatarsophalangeal (MTP)  joint of left foot   ?2. Arthritis of first metatarsophalangeal (MTP) joint of right foot   ?3. Encounter for preoperative examination for general surgical procedure   ? ?Plan:  ?Patient was evaluated and treated and all questions answered. ? ?Bilateral severe metatarsophalangeal joint arthritis noted with previous painful hardware (left greater than right side) ?-All questions and concerns were discussed with the  patient in extensive detail.  Given the amount of pain that she is experiencing I believe she will benefit from surgical intervention as she has failed all conservative treatment options at this time.  We can only do 1 foot at a time I discussed with the patient she states understanding we will plan on the left side followed by the right side.  I will plan on doing left first metatarsophalangeal joint surgical fusion with removal of previous painful orthopedic hardware if in the way of the plates and screws.  I discussed with the patient she states understand like to proceed with surgery. ?-She will be nonweightbearing in a cam boot for first 3 weeks.  Followed by she will be weightbearing as tolerated in cam boot ?-Informed surgical risk consent was reviewed and read aloud to the patient.  I reviewed the films.  I have discussed my findings with the patient in great detail.  I have discussed all risks including but not limited to infection, stiffness, scarring, limp, disability, deformity, damage to blood vessels and nerves, numbness, poor healing, need for braces, arthritis, chronic pain, amputation, death.  All benefits and realistic expectations discussed in great detail.  I have made no promises as to the outcome.  I have provided realistic expectations.  I have offered the patient a 2nd opinion, which they have declined and assured me they preferred to proceed despite the risks ? ? ?No follow-ups on file.  ?

## 2021-12-08 ENCOUNTER — Other Ambulatory Visit: Payer: Self-pay

## 2021-12-08 MED ORDER — SEMAGLUTIDE(0.25 OR 0.5MG/DOS) 2 MG/3ML ~~LOC~~ SOPN: 0.2500 mg | PEN_INJECTOR | SUBCUTANEOUS | 0 refills | Status: DC

## 2021-12-12 ENCOUNTER — Other Ambulatory Visit: Payer: Self-pay | Admitting: Internal Medicine

## 2022-01-07 ENCOUNTER — Encounter: Payer: Medicare PPO | Admitting: Internal Medicine

## 2022-01-12 ENCOUNTER — Ambulatory Visit: Payer: Medicare PPO | Admitting: Internal Medicine

## 2022-01-13 ENCOUNTER — Ambulatory Visit: Payer: Medicare PPO | Admitting: Internal Medicine

## 2022-01-13 ENCOUNTER — Encounter: Payer: Self-pay | Admitting: Internal Medicine

## 2022-01-13 VITALS — BP 132/90 | HR 71 | Temp 98.2°F | Ht 60.0 in | Wt 153.4 lb

## 2022-01-13 DIAGNOSIS — E1122 Type 2 diabetes mellitus with diabetic chronic kidney disease: Secondary | ICD-10-CM | POA: Diagnosis not present

## 2022-01-13 DIAGNOSIS — I129 Hypertensive chronic kidney disease with stage 1 through stage 4 chronic kidney disease, or unspecified chronic kidney disease: Secondary | ICD-10-CM | POA: Diagnosis not present

## 2022-01-13 DIAGNOSIS — N1831 Chronic kidney disease, stage 3a: Secondary | ICD-10-CM | POA: Diagnosis not present

## 2022-01-13 DIAGNOSIS — Z01818 Encounter for other preprocedural examination: Secondary | ICD-10-CM

## 2022-01-13 MED ORDER — SEMAGLUTIDE(0.25 OR 0.5MG/DOS) 2 MG/3ML ~~LOC~~ SOPN: 0.2500 mg | PEN_INJECTOR | SUBCUTANEOUS | 2 refills | Status: DC

## 2022-01-13 MED ORDER — EMPAGLIFLOZIN 10 MG PO TABS: 10.0000 mg | ORAL_TABLET | Freq: Every day | ORAL | 2 refills | Status: DC

## 2022-01-13 NOTE — Progress Notes (Signed)
Rich Brave Llittleton,acting as a Education administrator for Maximino Greenland, MD.,have documented all relevant documentation on the behalf of Maximino Greenland, MD,as directed by  Maximino Greenland, MD while in the presence of Maximino Greenland, MD.    Subjective:     Patient ID: Betty Arroyo , female    DOB: 1945/12/13 , 76 y.o.   MRN: [id number]   Chief Complaint  Patient presents with   Pre-op Exam    HPI  Patient presents today for a preop exam. Patient is getting ready to have surgery on her foot on 02/01/22. She denies having any chest pain, shortness of breath, palpitations and/or worsening fatigue. She states she is quite active, yet does not have a formal exercise program. She is now tutoring for summer school.   She is followed by Dr. Posey Pronto for her podiatric care.  She has been diagnosed with b/l severe metatarsophalangeal arthritis. She is scheduled to have left first metatarsophalangeal joint surgical fusion with removal of previous painful orthopedic hardware on August 14th.      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   Vertigo, benign paroxysmal      Family History  Problem Relation Age of Onset   Diabetes Mother    Hyperlipidemia Mother    Hypertension Mother    Varicose Veins Mother    Hyperlipidemia Brother    Hypertension Brother      Current Outpatient Medications:    Cholecalciferol (VITAMIN D3) 50 MCG (2000 UT) capsule, Take 2,000 Units by mouth daily., Disp: , Rfl:    meclizine (ANTIVERT) 25 MG tablet, Take 1 tablet (25 mg total) by mouth 3 (three) times daily as needed for dizziness., Disp: 30 tablet, Rfl: 0   rosuvastatin (CRESTOR) 40 MG tablet, Take 1 tablet (40 mg total) by mouth daily., Disp: 90 tablet, Rfl: 2   Semaglutide,0.25 or 0.5MG /DOS, (OZEMPIC, 0.25 OR 0.5 MG/DOSE,) 2 MG/1.5ML SOPN, Inject  0.25 mg into the skin once a week., Disp: 4.5 mL, Rfl: 1   triamcinolone cream (KENALOG) 0.1 %, Apply 1 application topically 2 (two) times daily., Disp: 30 g, Rfl: 3   vitamin C (ASCORBIC ACID) 500 MG tablet, Take 500 mg by mouth daily., Disp: , Rfl:    carvedilol (COREG) 6.25 MG tablet, TAKE 2 TABLETS BY MOUTH TWICE DAILY WITH FOOD, Disp: 180 tablet, Rfl: 0   empagliflozin (JARDIANCE) 10 MG TABS tablet, Take 1 tablet (10 mg total) by mouth daily., Disp: 90 tablet, Rfl: 2   Semaglutide,0.25 or 0.5MG /DOS, 2 MG/3ML SOPN, Inject 0.25 mg into the skin once a week., Disp: 6 mL, Rfl: 2   Allergies  Allergen Reactions   Farxiga [Dapagliflozin] Rash     Review of Systems  Constitutional: Negative.   Respiratory: Negative.    Cardiovascular: Negative.   Gastrointestinal: Negative.   Neurological: Negative.   Psychiatric/Behavioral: Negative.       Today's Vitals   01/13/22 1421 01/13/22 1514  BP: (!) 146/90 132/90  Pulse: 71   Temp: 98.2 F (36.8 C)   Weight: 153 lb 6.4 oz (69.6 kg)   Height: 5' (1.524 m)   PainSc: 0-No pain    Body mass index is 29.96 kg/m.  Wt Readings from Last 3 Encounters:  01/13/22 153 lb 6.4 oz (69.6 kg)  09/30/21 157 lb 9.6 oz (71.5 kg)  07/20/21 162 lb (73.5 kg)    Objective:  Physical Exam Vitals and nursing note reviewed.  Constitutional:      Appearance: Normal appearance.  HENT:     Head: Normocephalic and atraumatic.  Eyes:     Extraocular Movements: Extraocular movements intact.  Cardiovascular:     Rate and Rhythm: Normal rate and regular rhythm.     Heart sounds: Normal heart sounds.  Pulmonary:     Effort: Pulmonary effort is normal.     Breath sounds: Normal breath sounds.  Musculoskeletal:     Cervical back: Normal range of motion.  Skin:    General: Skin is warm.  Neurological:     General: No focal deficit present.     Mental Status: She is alert.  Psychiatric:        Mood and Affect: Mood normal.        Behavior: Behavior  normal.         Assessment And Plan:     1. Type 2 diabetes mellitus with stage 3a chronic kidney disease, without long-term current use of insulin (HCC) Comments: Chronic, I will check labs as below. Pt advised she is clear for surgery if a1c is less than 7.5%. She will rto in Oct 2023 for her next physical examination.  - empagliflozin (JARDIANCE) 10 MG TABS tablet; Take 1 tablet (10 mg total) by mouth daily.  Dispense: 90 tablet; Refill: 2 - Semaglutide,0.25 or 0.5MG /DOS, 2 MG/3ML SOPN; Inject 0.25 mg into the skin once a week.  Dispense: 6 mL; Refill: 2 - CMP14+EGFR; Future - CBC; Future - Lipid panel; Future - Hemoglobin A1c; Future - PTH, intact and calcium; Future - Phosphorus; Future - Protein electrophoresis, serum; Future  2. Parenchymal renal hypertension, stage 1 through stage 4 or unspecified chronic kidney disease Comments: Chronic, fair control. Goal BP<130/80. Advised to follow low sodium diet. EKG performed, NSR w/o acute changes.  - CMP14+EGFR; Future - CBC; Future - Lipid panel; Future  3. Preop examination - EKG 12-Lead   Patient was given opportunity to ask questions. Patient verbalized understanding of the plan and was able to repeat key elements of the plan. All questions were answered to their satisfaction.   I, Maximino Greenland, MD, have reviewed all documentation for this visit. The documentation on 01/24/22 for the exam, diagnosis, procedures, and orders are all accurate and complete.   IF YOU HAVE BEEN REFERRED TO A SPECIALIST, IT MAY TAKE 1-2 WEEKS TO SCHEDULE/PROCESS THE REFERRAL. IF YOU HAVE NOT HEARD FROM US/SPECIALIST IN TWO WEEKS, PLEASE GIVE [place] A CALL AT [phone].   THE PATIENT IS ENCOURAGED TO PRACTICE SOCIAL DISTANCING DUE TO THE COVID-19 PANDEMIC.

## 2022-01-13 NOTE — Patient Instructions (Signed)
Chronic Kidney Disease, Adult Chronic kidney disease (CKD) occurs when the kidneys are slowly and permanently damaged over a long period of time. The kidneys are a pair of organs that do many important jobs in the body, including: Removing waste and extra fluid from the blood to make urine. Making hormones that maintain the amount of fluid in tissues and blood vessels. Maintaining the right amount of fluids and chemicals in the body. A small amount of kidney damage may not cause problems, but a large amount of damage may make it hard or impossible for the kidneys to work right. Steps must be taken to slow kidney damage or to stop it from getting worse. If steps are not taken, the kidneys may stop working permanently (end-stage renal disease, or ESRD). Most of the time, CKD does not go away, but it can often be controlled. People who have CKD are usually able to live full lives. What are the causes? The most common causes of this condition are diabetes and high blood pressure (hypertension). Other causes include: Cardiovascular diseases. These affect the heart and blood vessels. Kidney diseases. These include: Glomerulonephritis, or inflammation of the tiny filters in the kidneys. Interstitial nephritis. This is swelling of the small tubes of the kidneys and of the surrounding structures. Polycystic kidney disease, in which clusters of fluid-filled sacs form within the kidneys. Renal vascular disease. This includes disorders that affect the arteries and veins of the kidneys. Diseases that affect the body's defense system (immune system). A problem with urine flow. This may be caused by: Kidney stones. Cancer. An enlarged prostate, in males. A kidney infection or urinary tract infection (UTI) that keeps coming back. Vasculitis. This is swelling or inflammation of the blood vessels. What increases the risk? Your chances of having kidney disease increase with age. The following factors may make  you more likely to develop this condition: A family history of kidney disease or kidney failure. Kidney failure means the kidneys can no longer work right. Certain genetic diseases. Taking medicines often that are damaging to the kidneys. Being around or being in contact with toxic substances. Obesity. A history of tobacco use. What are the signs or symptoms? Symptoms of this condition include: Feeling very tired (lethargic) and having less energy. Swelling, or edema, of the face, legs, ankles, or feet. Nausea or vomiting, or loss of appetite. Confusion or trouble concentrating. Muscle twitches and cramps, especially in the legs. Dry, itchy skin. A metallic taste in the mouth. Producing less urine, or producing more urine (especially at night). Shortness of breath. Trouble sleeping. CKD may also result in not having enough red blood cells or hemoglobin in the blood (anemia) or having weak bones (bone disease). Symptoms develop slowly and may not be obvious until the kidney damage becomes severe. It is possible to have kidney disease for years without having symptoms. How is this diagnosed? This condition may be diagnosed based on: Blood tests. Urine tests. Imaging tests, such as an ultrasound or a CT scan. A kidney biopsy. This involves removing a sample of kidney tissue to be looked at under a microscope. Results from these tests will help to determine how serious the CKD is. How is this treated? There is no cure for most cases of this condition, but treatment usually relieves symptoms and prevents or slows the worsening of the disease. Treatment may include: Diet changes, which may require you to avoid alcohol and foods that are high in salt, potassium, phosphorous, and protein. Medicines. These may:   Lower blood pressure. Control blood sugar (glucose). Relieve anemia. Relieve swelling. Protect your bones. Improve the balance of salts and minerals in your blood  (electrolytes). Dialysis, which is a type of treatment that removes toxic waste from the body. It may be needed if you have kidney failure. Managing any other conditions that are causing your CKD or making it worse. Follow these instructions at home: Medicines Take over-the-counter and prescription medicines only as told by your health care provider. The amount of some medicines that you take may need to be changed. Do not take any new medicines unless approved by your health care provider. Many medicines can make kidney damage worse. Do not take any vitamin and mineral supplements unless approved by your health care provider. Many nutritional supplements can make kidney damage worse. Lifestyle  Do not use any products that contain nicotine or tobacco, such as cigarettes, e-cigarettes, and chewing tobacco. If you need help quitting, ask your health care provider. If you drink alcohol: Limit how much you use to: 0-1 drink a day for women who are not pregnant. 0-2 drinks a day for men. Know how much alcohol is in your drink. In the [place] one drink equals one 12 oz bottle of beer (355 mL), one 5 oz glass of wine (148 mL), or one 1 oz glass of hard liquor (44 mL). Maintain a healthy weight. If you need help, ask your health care provider. General instructions  Follow instructions from your health care provider about eating or drinking restrictions, including any prescribed diet. Track your blood pressure at home. Report changes in your blood pressure as told. If you are being treated for diabetes, track your blood glucose levels as told. Start or continue an exercise plan. Exercise at least 30 minutes a day, 5 days a week. Keep your immunizations up to date as told. Keep all follow-up visits. This is important. Where to find more information American Association of Kidney Patients: www.aakp.org National Kidney Foundation: www.kidney.org American Kidney Fund: www.akfinc.org Life Options:  www.lifeoptions.org Kidney School: www.kidneyschool.org Contact a health care provider if: Your symptoms get worse. You develop new symptoms. Get help right away if: You develop symptoms of ESRD. These include: Headaches. Numbness in your hands or feet. Easy bruising. Frequent hiccups. Chest pain. Shortness of breath. Lack of menstrual periods, in women. You have a fever. You are producing less urine than usual. You have pain or bleeding when you urinate or when you have a bowel movement. These symptoms may represent a serious problem that is an emergency. Do not wait to see if the symptoms will go away. Get medical help right away. Call your local emergency services (911 in the [place] Do not drive yourself to the hospital. Summary Chronic kidney disease (CKD) occurs when the kidneys become damaged slowly over a long period of time. The most common causes of this condition are diabetes and high blood pressure (hypertension). There is no cure for most cases of CKD, but treatment usually relieves symptoms and prevents or slows the worsening of the disease. Treatment may include a combination of lifestyle changes, medicines, and dialysis. This information is not intended to replace advice given to you by your health care provider. Make sure you discuss any questions you have with your health care provider. Document Revised: 09/12/2019 Document Reviewed: 09/12/2019 Elsevier Patient Education  2023 Elsevier Inc.  

## 2022-01-13 NOTE — H&P (View-Only) (Signed)
Rich Brave Llittleton,acting as a Education administrator for Maximino Greenland, MD.,have documented all relevant documentation on the behalf of Maximino Greenland, MD,as directed by  Maximino Greenland, MD while in the presence of Maximino Greenland, MD.    Subjective:     Patient ID: Betty Arroyo , female    DOB: 1945-12-29 , 76 y.o.   MRN: [id number]   Chief Complaint  Patient presents with   Pre-op Exam    HPI  Patient presents today for a preop exam. Patient is getting ready to have surgery on her foot on 02/01/22. She denies having any chest pain, shortness of breath, palpitations and/or worsening fatigue. She states she is quite active, yet does not have a formal exercise program. She is now tutoring for summer school.   She is followed by Dr. Posey Pronto for her podiatric care.  She has been diagnosed with b/l severe metatarsophalangeal arthritis. She is scheduled to have left first metatarsophalangeal joint surgical fusion with removal of previous painful orthopedic hardware on August 14th.      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   Vertigo, benign paroxysmal      Family History  Problem Relation Age of Onset   Diabetes Mother    Hyperlipidemia Mother    Hypertension Mother    Varicose Veins Mother    Hyperlipidemia Brother    Hypertension Brother      Current Outpatient Medications:    Cholecalciferol (VITAMIN D3) 50 MCG (2000 UT) capsule, Take 2,000 Units by mouth daily., Disp: , Rfl:    meclizine (ANTIVERT) 25 MG tablet, Take 1 tablet (25 mg total) by mouth 3 (three) times daily as needed for dizziness., Disp: 30 tablet, Rfl: 0   rosuvastatin (CRESTOR) 40 MG tablet, Take 1 tablet (40 mg total) by mouth daily., Disp: 90 tablet, Rfl: 2   Semaglutide,0.25 or 0.5MG /DOS, (OZEMPIC, 0.25 OR 0.5 MG/DOSE,) 2 MG/1.5ML SOPN, Inject  0.25 mg into the skin once a week., Disp: 4.5 mL, Rfl: 1   triamcinolone cream (KENALOG) 0.1 %, Apply 1 application topically 2 (two) times daily., Disp: 30 g, Rfl: 3   vitamin C (ASCORBIC ACID) 500 MG tablet, Take 500 mg by mouth daily., Disp: , Rfl:    carvedilol (COREG) 6.25 MG tablet, TAKE 2 TABLETS BY MOUTH TWICE DAILY WITH FOOD, Disp: 180 tablet, Rfl: 0   empagliflozin (JARDIANCE) 10 MG TABS tablet, Take 1 tablet (10 mg total) by mouth daily., Disp: 90 tablet, Rfl: 2   Semaglutide,0.25 or 0.5MG /DOS, 2 MG/3ML SOPN, Inject 0.25 mg into the skin once a week., Disp: 6 mL, Rfl: 2   Allergies  Allergen Reactions   Farxiga [Dapagliflozin] Rash     Review of Systems  Constitutional: Negative.   Respiratory: Negative.    Cardiovascular: Negative.   Gastrointestinal: Negative.   Neurological: Negative.   Psychiatric/Behavioral: Negative.       Today's Vitals   01/13/22 1421 01/13/22 1514  BP: (!) 146/90 132/90  Pulse: 71   Temp: 98.2 F (36.8 C)   Weight: 153 lb 6.4 oz (69.6 kg)   Height: 5' (1.524 m)   PainSc: 0-No pain    Body mass index is 29.96 kg/m.  Wt Readings from Last 3 Encounters:  01/13/22 153 lb 6.4 oz (69.6 kg)  09/30/21 157 lb 9.6 oz (71.5 kg)  07/20/21 162 lb (73.5 kg)    Objective:  Physical Exam Vitals and nursing note reviewed.  Constitutional:      Appearance: Normal appearance.  HENT:     Head: Normocephalic and atraumatic.  Eyes:     Extraocular Movements: Extraocular movements intact.  Cardiovascular:     Rate and Rhythm: Normal rate and regular rhythm.     Heart sounds: Normal heart sounds.  Pulmonary:     Effort: Pulmonary effort is normal.     Breath sounds: Normal breath sounds.  Musculoskeletal:     Cervical back: Normal range of motion.  Skin:    General: Skin is warm.  Neurological:     General: No focal deficit present.     Mental Status: She is alert.  Psychiatric:        Mood and Affect: Mood normal.        Behavior: Behavior  normal.         Assessment And Plan:     1. Type 2 diabetes mellitus with stage 3a chronic kidney disease, without long-term current use of insulin (HCC) Comments: Chronic, I will check labs as below. Pt advised she is clear for surgery if a1c is less than 7.5%. She will rto in Oct 2023 for her next physical examination.  - empagliflozin (JARDIANCE) 10 MG TABS tablet; Take 1 tablet (10 mg total) by mouth daily.  Dispense: 90 tablet; Refill: 2 - Semaglutide,0.25 or 0.5MG /DOS, 2 MG/3ML SOPN; Inject 0.25 mg into the skin once a week.  Dispense: 6 mL; Refill: 2 - CMP14+EGFR; Future - CBC; Future - Lipid panel; Future - Hemoglobin A1c; Future - PTH, intact and calcium; Future - Phosphorus; Future - Protein electrophoresis, serum; Future  2. Parenchymal renal hypertension, stage 1 through stage 4 or unspecified chronic kidney disease Comments: Chronic, fair control. Goal BP<130/80. Advised to follow low sodium diet. EKG performed, NSR w/o acute changes.  - CMP14+EGFR; Future - CBC; Future - Lipid panel; Future  3. Preop examination - EKG 12-Lead   Patient was given opportunity to ask questions. Patient verbalized understanding of the plan and was able to repeat key elements of the plan. All questions were answered to their satisfaction.   I, Maximino Greenland, MD, have reviewed all documentation for this visit. The documentation on 01/24/22 for the exam, diagnosis, procedures, and orders are all accurate and complete.   IF YOU HAVE BEEN REFERRED TO A SPECIALIST, IT MAY TAKE 1-2 WEEKS TO SCHEDULE/PROCESS THE REFERRAL. IF YOU HAVE NOT HEARD FROM US/SPECIALIST IN TWO WEEKS, PLEASE GIVE [place] A CALL AT [phone].   THE PATIENT IS ENCOURAGED TO PRACTICE SOCIAL DISTANCING DUE TO THE COVID-19 PANDEMIC.

## 2022-01-14 ENCOUNTER — Telehealth: Payer: Self-pay | Admitting: Urology

## 2022-01-14 NOTE — Telephone Encounter (Signed)
DOS - 02/01/22  HALLUX MPJ FUSION LEFT --- [id number]  [employer] EFFECTIVE DATE - 06/22/19  PLAN DEDUCTIBLE - $0.00 OUT OF POCKET - $4,000.00 W/ $3,824.71 REMAINING COINSURANCE - 0% COPAY - $250.00  PER [employer] WEBSITE FOR CPT CODE 59935 HAS BEEN APPROVED, AUTH # [id number], GOOD FROM 02/01/22 - 05/02/22.  TRACKING # [id number]

## 2022-01-17 ENCOUNTER — Other Ambulatory Visit: Payer: Self-pay | Admitting: Internal Medicine

## 2022-01-18 ENCOUNTER — Other Ambulatory Visit: Payer: Medicare PPO

## 2022-01-18 DIAGNOSIS — N1831 Chronic kidney disease, stage 3a: Secondary | ICD-10-CM | POA: Diagnosis not present

## 2022-01-18 DIAGNOSIS — I129 Hypertensive chronic kidney disease with stage 1 through stage 4 chronic kidney disease, or unspecified chronic kidney disease: Secondary | ICD-10-CM | POA: Diagnosis not present

## 2022-01-18 DIAGNOSIS — E1122 Type 2 diabetes mellitus with diabetic chronic kidney disease: Secondary | ICD-10-CM | POA: Diagnosis not present

## 2022-01-19 DIAGNOSIS — Z1231 Encounter for screening mammogram for malignant neoplasm of breast: Secondary | ICD-10-CM | POA: Diagnosis not present

## 2022-01-19 LAB — HM MAMMOGRAPHY
HM Mammogram: NORMAL (ref 0–4)
HM Mammogram: NORMAL (ref 0–4)
HM Mammogram: NORMAL (ref 0–4)
HM Mammogram: NORMAL (ref 0–4)

## 2022-01-20 LAB — PROTEIN ELECTROPHORESIS, SERUM
A/G Ratio: 1.3 (ref 0.7–1.7)
Albumin ELP: 3.8 g/dL (ref 2.9–4.4)
Alpha 1: 0.2 g/dL (ref 0.0–0.4)
Alpha 2: 0.8 g/dL (ref 0.4–1.0)
Beta: 1 g/dL (ref 0.7–1.3)
Gamma Globulin: 1 g/dL (ref 0.4–1.8)
Globulin, Total: 3 g/dL (ref 2.2–3.9)

## 2022-01-20 LAB — LIPID PANEL
Chol/HDL Ratio: 3.2 ratio (ref 0.0–4.4)
Cholesterol, Total: 154 mg/dL (ref 100–199)
HDL: 48 mg/dL (ref >39)
LDL Chol Calc (NIH): 92 mg/dL (ref 0–99)
Triglycerides: 72 mg/dL (ref 0–149)
VLDL Cholesterol Cal: 14 mg/dL (ref 5–40)

## 2022-01-20 LAB — CMP14+EGFR
ALT: 17 IU/L (ref 0–32)
AST: 15 IU/L (ref 0–40)
Albumin/Globulin Ratio: 2 (ref 1.2–2.2)
Albumin: 4.5 g/dL (ref 3.8–4.8)
Alkaline Phosphatase: 94 IU/L (ref 44–121)
BUN/Creatinine Ratio: 14 (ref 12–28)
BUN: 14 mg/dL (ref 8–27)
Bilirubin Total: 0.6 mg/dL (ref 0.0–1.2)
CO2: 23 mmol/L (ref 20–29)
Calcium: 9.3 mg/dL (ref 8.7–10.3)
Chloride: 102 mmol/L (ref 96–106)
Creatinine, Ser: 1.02 mg/dL — ABNORMAL HIGH (ref 0.57–1.00)
Globulin, Total: 2.3 g/dL (ref 1.5–4.5)
Glucose: 89 mg/dL (ref 70–99)
Potassium: 3.7 mmol/L (ref 3.5–5.2)
Sodium: 140 mmol/L (ref 134–144)
Total Protein: 6.8 g/dL (ref 6.0–8.5)
eGFR: 58 mL/min/{1.73_m2} — ABNORMAL LOW (ref >59)

## 2022-01-20 LAB — CBC
Hematocrit: 48.1 % — ABNORMAL HIGH (ref 34.0–46.6)
Hemoglobin: 15.4 g/dL (ref 11.1–15.9)
MCH: 28.2 pg (ref 26.6–33.0)
MCHC: 32 g/dL (ref 31.5–35.7)
MCV: 88 fL (ref 79–97)
Platelets: 198 10*3/uL (ref 150–450)
RBC: 5.47 x10E6/uL — ABNORMAL HIGH (ref 3.77–5.28)
RDW: 14 % (ref 11.7–15.4)
WBC: 4.3 10*3/uL (ref 3.4–10.8)

## 2022-01-20 LAB — PTH, INTACT AND CALCIUM: PTH: 39 pg/mL (ref 15–65)

## 2022-01-20 LAB — PHOSPHORUS: Phosphorus: 3.5 mg/dL (ref 3.0–4.3)

## 2022-01-20 LAB — HEMOGLOBIN A1C
Est. average glucose Bld gHb Est-mCnc: 131 mg/dL
Hgb A1c MFr Bld: 6.2 % — ABNORMAL HIGH (ref 4.8–5.6)

## 2022-01-20 LAB — HM MAMMOGRAPHY: HM Mammogram: NORMAL (ref 0–4)

## 2022-01-25 ENCOUNTER — Encounter (HOSPITAL_BASED_OUTPATIENT_CLINIC_OR_DEPARTMENT_OTHER): Payer: Self-pay | Admitting: Podiatry

## 2022-01-25 NOTE — Progress Notes (Signed)
Spoke w/ via phone for pre-op interview--- pt Lab needs dos----  [facility] results------ current ekg in epic/ chart COVID test -----patient states asymptomatic no test needed Arrive at ------- 0530 on 02-01-2022  NPO after MN NO Solid Food.  Clear liquids from MN until--- 0430 Med rec completed Medications to take morning of surgery ----- coreg Diabetic medication ----- pt takes meds at night Patient instructed no nail polish to be worn day of surgery Patient instructed to bring photo id and insurance card day of surgery Patient aware to have Driver (ride ) / caregiver for 24 hours after surgery --husband, walter Patient Special Instructions ----- n/a Pre-Op special Istructions ----- have not received pt's pcp h&p from Dr Eliane Decree office yet. Called and left message for Chelle, OR scheduler, to please fax to place on chart. Patient verbalized understanding of instructions that were given at this phone interview. Patient denies shortness of breath, chest pain, fever, cough at this phone interview.

## 2022-01-26 ENCOUNTER — Other Ambulatory Visit: Payer: Self-pay

## 2022-01-26 MED ORDER — FLUCONAZOLE 100 MG PO TABS: ORAL_TABLET | ORAL | 0 refills | Status: DC

## 2022-01-31 NOTE — Anesthesia Preprocedure Evaluation (Addendum)
Anesthesia Evaluation  Patient identified by MRN, date of birth, ID band Patient awake    Reviewed: Allergy & Precautions, NPO status , Patient's Chart, lab work & pertinent test results  Airway Mallampati: II  TM Distance: >3 FB Neck ROM: Full    Dental no notable dental hx.    Pulmonary neg pulmonary ROS,    Pulmonary exam normal breath sounds clear to auscultation       Cardiovascular hypertension, Pt. on home beta blockers negative cardio ROS Normal cardiovascular exam Rhythm:Regular Rate:Normal     Neuro/Psych negative neurological ROS  negative psych ROS   GI/Hepatic negative GI ROS, Neg liver ROS,   Endo/Other  negative endocrine ROSdiabetes, Type 2, Oral Hypoglycemic Agents  Renal/GU Renal Insufficiency and CRFRenal diseasenegative Renal ROS  negative genitourinary   Musculoskeletal negative musculoskeletal ROS (+) Arthritis , Osteoarthritis,    Abdominal (+) + obese,   Peds negative pediatric ROS (+)  Hematology negative hematology ROS (+)   Anesthesia Other Findings   Reproductive/Obstetrics negative OB ROS                            Anesthesia Physical  Anesthesia Plan  ASA: 3  Anesthesia Plan: Regional and General   Post-op Pain Management: Regional block* and Minimal or no pain anticipated   Induction: Intravenous  PONV Risk Score and Plan: 3 and Ondansetron, Dexamethasone and Treatment may vary due to age or medical condition  Airway Management Planned: LMA  Additional Equipment: None  Intra-op Plan:   Post-operative Plan: Extubation in OR  Informed Consent: I have reviewed the patients History and Physical, chart, labs and discussed the procedure including the risks, benefits and alternatives for the proposed anesthesia with the patient or authorized representative who has indicated his/her understanding and acceptance.     Dental advisory given  Plan  Discussed with: CRNA and Anesthesiologist  Anesthesia Plan Comments:         Anesthesia Quick Evaluation

## 2022-02-01 ENCOUNTER — Ambulatory Visit (HOSPITAL_BASED_OUTPATIENT_CLINIC_OR_DEPARTMENT_OTHER): Payer: Medicare PPO

## 2022-02-01 ENCOUNTER — Other Ambulatory Visit: Payer: Self-pay | Admitting: Podiatry

## 2022-02-01 ENCOUNTER — Encounter (HOSPITAL_BASED_OUTPATIENT_CLINIC_OR_DEPARTMENT_OTHER)
Admission: RE | Disposition: A | Discharge status: Home / Self Care | Payer: Self-pay | Source: Home / Self Care | Attending: Podiatry

## 2022-02-01 ENCOUNTER — Ambulatory Visit (HOSPITAL_BASED_OUTPATIENT_CLINIC_OR_DEPARTMENT_OTHER): Payer: Medicare PPO | Admitting: Anesthesiology

## 2022-02-01 ENCOUNTER — Other Ambulatory Visit: Payer: Self-pay

## 2022-02-01 ENCOUNTER — Encounter: Payer: Self-pay | Admitting: Internal Medicine

## 2022-02-01 ENCOUNTER — Encounter (HOSPITAL_BASED_OUTPATIENT_CLINIC_OR_DEPARTMENT_OTHER): Payer: Self-pay | Admitting: Podiatry

## 2022-02-01 ENCOUNTER — Ambulatory Visit (HOSPITAL_BASED_OUTPATIENT_CLINIC_OR_DEPARTMENT_OTHER)
Admission: RE | Admit: 2022-02-01 | Discharge: 2022-02-01 | Disposition: A | Discharge status: Home / Self Care | Payer: Medicare PPO | Attending: Podiatry | Admitting: Podiatry

## 2022-02-01 ENCOUNTER — Encounter: Payer: Self-pay | Admitting: Podiatry

## 2022-02-01 ENCOUNTER — Ambulatory Visit (HOSPITAL_COMMUNITY): Payer: Medicare PPO

## 2022-02-01 DIAGNOSIS — Z981 Arthrodesis status: Secondary | ICD-10-CM | POA: Diagnosis not present

## 2022-02-01 DIAGNOSIS — Z01818 Encounter for other preprocedural examination: Secondary | ICD-10-CM

## 2022-02-01 DIAGNOSIS — Z7984 Long term (current) use of oral hypoglycemic drugs: Secondary | ICD-10-CM | POA: Diagnosis not present

## 2022-02-01 DIAGNOSIS — M19072 Primary osteoarthritis, left ankle and foot: Secondary | ICD-10-CM | POA: Diagnosis not present

## 2022-02-01 DIAGNOSIS — N1831 Chronic kidney disease, stage 3a: Secondary | ICD-10-CM

## 2022-02-01 DIAGNOSIS — N189 Chronic kidney disease, unspecified: Secondary | ICD-10-CM

## 2022-02-01 DIAGNOSIS — I129 Hypertensive chronic kidney disease with stage 1 through stage 4 chronic kidney disease, or unspecified chronic kidney disease: Secondary | ICD-10-CM

## 2022-02-01 DIAGNOSIS — E1122 Type 2 diabetes mellitus with diabetic chronic kidney disease: Secondary | ICD-10-CM

## 2022-02-01 DIAGNOSIS — Z4889 Encounter for other specified surgical aftercare: Secondary | ICD-10-CM | POA: Diagnosis not present

## 2022-02-01 DIAGNOSIS — G8918 Other acute postprocedural pain: Secondary | ICD-10-CM | POA: Diagnosis not present

## 2022-02-01 DIAGNOSIS — M7732 Calcaneal spur, left foot: Secondary | ICD-10-CM | POA: Diagnosis not present

## 2022-02-01 HISTORY — DX: Hypertensive chronic kidney disease with stage 1 through stage 4 chronic kidney disease, or unspecified chronic kidney disease: I12.9

## 2022-02-01 HISTORY — PX: HALLUX FUSION: SHX6621

## 2022-02-01 HISTORY — DX: Unspecified osteoarthritis, unspecified site: M19.90

## 2022-02-01 HISTORY — DX: Type 2 diabetes mellitus without complications: E11.9

## 2022-02-01 HISTORY — DX: Chronic kidney disease, stage 3 unspecified: N18.30

## 2022-02-01 HISTORY — DX: Hyperlipidemia, unspecified: E78.5

## 2022-02-01 HISTORY — DX: Presence of spectacles and contact lenses: Z97.3

## 2022-02-01 LAB — POCT I-STAT, CHEM 8
BUN: 16 mg/dL (ref 8–23)
Calcium, Ion: 1.26 mmol/L (ref 1.15–1.40)
Chloride: 105 mmol/L (ref 98–111)
Creatinine, Ser: 0.9 mg/dL (ref 0.44–1.00)
Glucose, Bld: 126 mg/dL — ABNORMAL HIGH (ref 70–99)
HCT: 49 % — ABNORMAL HIGH (ref 36.0–46.0)
Hemoglobin: 16.7 g/dL — ABNORMAL HIGH (ref 12.0–15.0)
Potassium: 3.8 mmol/L (ref 3.5–5.1)
Sodium: 144 mmol/L (ref 135–145)
TCO2: 23 mmol/L (ref 22–32)

## 2022-02-01 LAB — GLUCOSE, CAPILLARY: Glucose-Capillary: 100 mg/dL — ABNORMAL HIGH (ref 70–99)

## 2022-02-01 SURGERY — FUSION, JOINT, GREAT TOE
Anesthesia: Regional | Site: Foot | Laterality: Left

## 2022-02-01 MED ORDER — MEPERIDINE HCL 25 MG/ML IJ SOLN: 6.2500 mg | INTRAMUSCULAR | Status: DC | PRN

## 2022-02-01 MED ORDER — 0.9 % SODIUM CHLORIDE (POUR BTL) OPTIME
TOPICAL | Status: DC | PRN
  Administered 2022-02-01: 500 mL

## 2022-02-01 MED ORDER — DEXAMETHASONE SODIUM PHOSPHATE 4 MG/ML IJ SOLN
INTRAMUSCULAR | Status: DC | PRN
  Administered 2022-02-01: 5 mg via INTRAVENOUS

## 2022-02-01 MED ORDER — ONDANSETRON HCL 4 MG/2ML IJ SOLN: 4.0000 mg | Freq: Once | INTRAMUSCULAR | Status: DC | PRN

## 2022-02-01 MED ORDER — FENTANYL CITRATE (PF) 100 MCG/2ML IJ SOLN
INTRAMUSCULAR | Status: AC
  Filled 2022-02-01: qty 2

## 2022-02-01 MED ORDER — LIDOCAINE 2% (20 MG/ML) 5 ML SYRINGE
INTRAMUSCULAR | Status: DC | PRN
  Administered 2022-02-01: 60 mg via INTRAVENOUS

## 2022-02-01 MED ORDER — PROPOFOL 10 MG/ML IV BOLUS
INTRAVENOUS | Status: AC
  Filled 2022-02-01: qty 20

## 2022-02-01 MED ORDER — SODIUM CHLORIDE 0.9 % IV SOLN: INTRAVENOUS | Status: DC

## 2022-02-01 MED ORDER — OXYCODONE HCL 5 MG PO TABS: 5.0000 mg | ORAL_TABLET | Freq: Once | ORAL | Status: DC | PRN

## 2022-02-01 MED ORDER — ROPIVACAINE HCL 5 MG/ML IJ SOLN
INTRAMUSCULAR | Status: DC | PRN
  Administered 2022-02-01: 15 mL via PERINEURAL

## 2022-02-01 MED ORDER — OXYCODONE HCL 5 MG/5ML PO SOLN: 5.0000 mg | Freq: Once | ORAL | Status: DC | PRN

## 2022-02-01 MED ORDER — DEXAMETHASONE SODIUM PHOSPHATE 10 MG/ML IJ SOLN
INTRAMUSCULAR | Status: AC
  Filled 2022-02-01: qty 1

## 2022-02-01 MED ORDER — PHENYLEPHRINE 80 MCG/ML (10ML) SYRINGE FOR IV PUSH (FOR BLOOD PRESSURE SUPPORT)
PREFILLED_SYRINGE | INTRAVENOUS | Status: AC
  Filled 2022-02-01: qty 10

## 2022-02-01 MED ORDER — FENTANYL CITRATE (PF) 100 MCG/2ML IJ SOLN
50.0000 ug | Freq: Once | INTRAMUSCULAR | Status: AC
  Administered 2022-02-01: 50 ug via INTRAVENOUS

## 2022-02-01 MED ORDER — PHENYLEPHRINE HCL (PRESSORS) 10 MG/ML IV SOLN
INTRAVENOUS | Status: DC | PRN
  Administered 2022-02-01 (×2): 80 ug via INTRAVENOUS

## 2022-02-01 MED ORDER — ONDANSETRON HCL 4 MG/2ML IJ SOLN
INTRAMUSCULAR | Status: DC | PRN
  Administered 2022-02-01: 4 mg via INTRAVENOUS

## 2022-02-01 MED ORDER — BUPIVACAINE HCL (PF) 0.5 % IJ SOLN
INTRAMUSCULAR | Status: DC | PRN
  Administered 2022-02-01: 10 mL via PERINEURAL

## 2022-02-01 MED ORDER — FENTANYL CITRATE (PF) 100 MCG/2ML IJ SOLN: 25.0000 ug | INTRAMUSCULAR | Status: DC | PRN

## 2022-02-01 MED ORDER — BUPIVACAINE LIPOSOME 1.3 % IJ SUSP
INTRAMUSCULAR | Status: DC | PRN
  Administered 2022-02-01: 10 mL via PERINEURAL

## 2022-02-01 MED ORDER — PROPOFOL 10 MG/ML IV BOLUS
INTRAVENOUS | Status: DC | PRN
  Administered 2022-02-01: 50 mg via INTRAVENOUS
  Administered 2022-02-01: 150 mg via INTRAVENOUS

## 2022-02-01 MED ORDER — CEFAZOLIN SODIUM-DEXTROSE 2-4 GM/100ML-% IV SOLN
2.0000 g | Freq: Once | INTRAVENOUS | Status: AC
  Administered 2022-02-01: 2 g via INTRAVENOUS

## 2022-02-01 MED ORDER — MIDAZOLAM HCL 2 MG/2ML IJ SOLN
INTRAMUSCULAR | Status: AC
  Filled 2022-02-01: qty 2

## 2022-02-01 MED ORDER — ACETAMINOPHEN 325 MG PO TABS: 325.0000 mg | ORAL_TABLET | ORAL | Status: DC | PRN

## 2022-02-01 MED ORDER — IBUPROFEN 800 MG PO TABS: 800.0000 mg | ORAL_TABLET | Freq: Four times a day (QID) | ORAL | 1 refills | Status: DC | PRN

## 2022-02-01 MED ORDER — OXYCODONE-ACETAMINOPHEN 5-325 MG PO TABS: 1.0000 | ORAL_TABLET | ORAL | 0 refills | Status: DC | PRN

## 2022-02-01 MED ORDER — ACETAMINOPHEN 160 MG/5ML PO SOLN: 325.0000 mg | ORAL | Status: DC | PRN

## 2022-02-01 MED ORDER — FENTANYL CITRATE (PF) 100 MCG/2ML IJ SOLN
INTRAMUSCULAR | Status: DC | PRN
  Administered 2022-02-01: 25 ug via INTRAVENOUS

## 2022-02-01 MED ORDER — MIDAZOLAM HCL 2 MG/2ML IJ SOLN
2.0000 mg | Freq: Once | INTRAMUSCULAR | Status: AC
  Administered 2022-02-01: 2 mg via INTRAVENOUS

## 2022-02-01 MED ORDER — CEFAZOLIN SODIUM-DEXTROSE 2-4 GM/100ML-% IV SOLN
INTRAVENOUS | Status: AC
  Filled 2022-02-01: qty 100

## 2022-02-01 MED ORDER — LIDOCAINE HCL (PF) 2 % IJ SOLN
INTRAMUSCULAR | Status: AC
  Filled 2022-02-01: qty 5

## 2022-02-01 MED ORDER — ONDANSETRON HCL 4 MG/2ML IJ SOLN
INTRAMUSCULAR | Status: AC
  Filled 2022-02-01: qty 2

## 2022-02-01 SURGICAL SUPPLY — 71 items
BANDAGE ACE 4 STERILE IMPLANT
BLADE AVERAGE 25X9 (BLADE) ×1 IMPLANT
BLADE OSC/SAG .038X5.5 CUT EDG (BLADE) ×1 IMPLANT
BLADE OSCILLATING/SAGITTAL (BLADE)
BLADE SURG 15 STRL LF DISP TIS (BLADE) ×3 IMPLANT
BLADE SURG 15 STRL SS (BLADE) ×6
BLADE SW THK.38XMED LNG THN (BLADE) IMPLANT
BNDG CMPR 9X4 STRL LF SNTH (GAUZE/BANDAGES/DRESSINGS) ×1
BNDG ELASTIC 4X5.8 VLCR STR LF (GAUZE/BANDAGES/DRESSINGS) ×1 IMPLANT
BNDG ELASTIC 6X5.8 VLCR STR LF (GAUZE/BANDAGES/DRESSINGS) ×1 IMPLANT
BNDG ESMARK 4X9 LF (GAUZE/BANDAGES/DRESSINGS) ×2 IMPLANT
BNDG GAUZE DERMACEA FLUFF (GAUZE/BANDAGES/DRESSINGS) ×1
BNDG GAUZE DERMACEA FLUFF 4 (GAUZE/BANDAGES/DRESSINGS) ×1 IMPLANT
BNDG GAUZE ELAST 4 BULKY (GAUZE/BANDAGES/DRESSINGS) ×1 IMPLANT
BNDG GZE DERMACEA 4 6PLY (GAUZE/BANDAGES/DRESSINGS) ×1
BONE STAPLE KIT 18X18X18 (Staple) ×2 IMPLANT
BUR OVAL CARBIDE 4.0 (BURR) IMPLANT
COVER BACK TABLE 60X90IN (DRAPES) ×2 IMPLANT
COVER MAYO STAND STRL (DRAPES) IMPLANT
CUFF TOURN SGL QUICK 18 (TOURNIQUET CUFF) ×1 IMPLANT
CUFF TOURN SGL QUICK 24 (TOURNIQUET CUFF)
CUFF TOURN SGL QUICK 34 (TOURNIQUET CUFF)
CUFF TRNQT CYL 24X4X16.5-23 (TOURNIQUET CUFF) IMPLANT
CUFF TRNQT CYL 34X4.125X (TOURNIQUET CUFF) IMPLANT
CrossRoads DynaForce MPJ Reamer (Cup) ×1 IMPLANT
CrossRoads DynaForce Plate (Plate) ×1 IMPLANT
DRAPE EXTREMITY T 121X128X90 (DISPOSABLE) ×2 IMPLANT
DRAPE IMP U-DRAPE 54X76 (DRAPES) ×2 IMPLANT
DRAPE OEC MINIVIEW 54X84 (DRAPES) ×2 IMPLANT
DRAPE U-SHAPE 47X51 STRL (DRAPES) ×2 IMPLANT
DRSG ADAPTIC 3X8 NADH LF (GAUZE/BANDAGES/DRESSINGS) ×1 IMPLANT
DRSG EMULSION OIL 3X3 NADH (GAUZE/BANDAGES/DRESSINGS) ×1 IMPLANT
DURAPREP 26ML APPLICATOR (WOUND CARE) ×2 IMPLANT
ELECT REM PT RETURN 9FT ADLT (ELECTROSURGICAL) ×2
ELECTRODE REM PT RTRN 9FT ADLT (ELECTROSURGICAL) ×1 IMPLANT
GAUZE 4X4 16PLY ~~LOC~~+RFID DBL (SPONGE) ×2 IMPLANT
GAUZE SPONGE 4X4 12PLY STRL (GAUZE/BANDAGES/DRESSINGS) ×2 IMPLANT
GAUZE XEROFORM 1X8 LF (GAUZE/BANDAGES/DRESSINGS) ×1 IMPLANT
GLOVE BIO SURGEON STRL SZ7 (GLOVE) ×2 IMPLANT
GLOVE BIOGEL PI IND STRL 7.5 (GLOVE) ×1 IMPLANT
GLOVE BIOGEL PI INDICATOR 7.5 (GLOVE) ×1
GOWN STRL REUS W/TWL LRG LVL3 (GOWN DISPOSABLE) ×2 IMPLANT
K-WIRE SURGICAL 1.6X102 (WIRE) ×1 IMPLANT
KIT INSTRUMENT DYNAFORCE PLATE (KITS) ×1 IMPLANT
KIT STAPLE BONE HIMAX 18X18X18 (Staple) IMPLANT
KIT TURNOVER CYSTO (KITS) ×2 IMPLANT
NS IRRIG 1000ML POUR BTL (IV SOLUTION) IMPLANT
NS IRRIG 500ML POUR BTL (IV SOLUTION) ×1 IMPLANT
PACK BASIN DAY SURGERY FS (CUSTOM PROCEDURE TRAY) ×2 IMPLANT
PENCIL SMOKE EVACUATOR (MISCELLANEOUS) ×2 IMPLANT
PLATE MTP STANDARD 18 (Plate) ×2 IMPLANT
PLATE MTP STD 18 (Plate) IMPLANT
RASP SM TEAR CROSS CUT (RASP) IMPLANT
REAMER CUP/CONE DYNAFORCE 20 (MISCELLANEOUS) ×1 IMPLANT
SCREW NL MOTOBAND 3.5X16 (Screw) ×1 IMPLANT
SCREW PA MOTO BAND 3.5X14 (Screw) ×1 IMPLANT
SCREW POLYAXIL LOCK 3.5X12 (Screw) ×2 IMPLANT
SPONGE T-LAP 4X18 ~~LOC~~+RFID (SPONGE) IMPLANT
STOCKINETTE 6  STRL (DRAPES) ×2
STOCKINETTE 6 STRL (DRAPES) ×1 IMPLANT
SUCTION FRAZIER HANDLE 10FR (MISCELLANEOUS) ×2
SUCTION TUBE FRAZIER 10FR DISP (MISCELLANEOUS) ×1 IMPLANT
SUT MNCRL AB 3-0 PS2 18 (SUTURE) ×2 IMPLANT
SUT MNCRL AB 4-0 PS2 18 (SUTURE) ×2 IMPLANT
SUT MON AB 5-0 PS2 18 (SUTURE) ×2 IMPLANT
SUT PROLENE 3 0 PS 2 (SUTURE) IMPLANT
SUT PROLENE 4 0 PS 2 18 (SUTURE) IMPLANT
SYR BULB EAR ULCER 3OZ GRN STR (SYRINGE) ×2 IMPLANT
TOWEL OR 17X26 10 PK STRL BLUE (TOWEL DISPOSABLE) ×2 IMPLANT
TUBE CONNECTING 12X1/4 (SUCTIONS) ×2 IMPLANT
UNDERPAD 30X36 HEAVY ABSORB (UNDERPADS AND DIAPERS) ×2 IMPLANT

## 2022-02-01 NOTE — Transfer of Care (Signed)
Immediate Anesthesia Transfer of Care Note  Patient: Betty Arroyo  Procedure(s) Performed: Procedure(s) (LRB): HALLUX FUSION, METATARSAL PHALANGEAL JOINT (Left)  Patient Location: PACU  Anesthesia Type: General  Level of Consciousness: awake, sedated, patient cooperative and responds to stimulation  Airway & Oxygen Therapy: Patient Spontanous Breathing and Patient on RA  Post-op Assessment: Report given to PACU RN, Post -op Vital signs reviewed and stable and Patient moving all extremities  Post vital signs: Reviewed and stable  Complications: No apparent anesthesia complications

## 2022-02-01 NOTE — Anesthesia Procedure Notes (Signed)
Anesthesia Regional Block: Adductor canal block   Pre-Anesthetic Checklist: , timeout performed,  Correct Patient, Correct Site, Correct Laterality,  Correct Procedure, Correct Position, site marked,  Risks and benefits discussed,  Surgical consent,  Pre-op evaluation,  At surgeon's request and post-op pain management  Laterality: Left  Prep: chloraprep       Needles:  Injection technique: Single-shot  Needle Type: Echogenic Stimulator Needle     Needle Length: 5cm  Needle Gauge: 22     Additional Needles:   Procedures:,,,, ultrasound used (permanent image in chart),,    Narrative:  Start time: 02/01/2022 7:00 AM End time: 02/01/2022 7:05 AM Injection made incrementally with aspirations every 5 mL.  Performed by: Personally  Anesthesiologist: Bethena Midget, MD  Additional Notes: Functioning IV was confirmed and monitors were applied.  A 67mm 22ga Arrow echogenic stimulator needle was used. Sterile prep and drape,hand hygiene and sterile gloves were used. Ultrasound guidance: relevant anatomy identified, needle position confirmed, local anesthetic spread visualized around nerve(s)., vascular puncture avoided.  Image printed for medical record. Negative aspiration and negative test dose prior to incremental administration of local anesthetic. The patient tolerated the procedure well.

## 2022-02-01 NOTE — Progress Notes (Signed)
Assisted Dr. Oddono with left, adductor canal, popliteal, ultrasound guided block. Side rails up, monitors on throughout procedure. See vital signs in flow sheet. Tolerated Procedure well. 

## 2022-02-01 NOTE — Anesthesia Procedure Notes (Signed)
Anesthesia Regional Block: Popliteal block   Pre-Anesthetic Checklist: , timeout performed,  Correct Patient, Correct Site, Correct Laterality,  Correct Procedure, Correct Position, site marked,  Risks and benefits discussed,  Surgical consent,  Pre-op evaluation,  At surgeon's request and post-op pain management  Laterality: Left  Prep: chloraprep       Needles:  Injection technique: Single-shot  Needle Type: Echogenic Stimulator Needle     Needle Length: 5cm  Needle Gauge: 22     Additional Needles:   Procedures:, nerve stimulator,,, ultrasound used (permanent image in chart),,     Nerve Stimulator or Paresthesia:  Response: foot, 0.45 mA  Additional Responses:   Narrative:  Start time: 02/01/2022 7:10 AM End time: 02/01/2022 7:14 AM Injection made incrementally with aspirations every 5 mL.  Performed by: Personally  Anesthesiologist: Bethena Midget, MD  Additional Notes: Functioning IV was confirmed and monitors were applied.  A 48mm 22ga Arrow echogenic stimulator needle was used. Sterile prep and drape,hand hygiene and sterile gloves were used. Ultrasound guidance: relevant anatomy identified, needle position confirmed, local anesthetic spread visualized around nerve(s)., vascular puncture avoided.  Image printed for medical record. Negative aspiration and negative test dose prior to incremental administration of local anesthetic. The patient tolerated the procedure well.

## 2022-02-01 NOTE — Discharge Instructions (Addendum)
After Surgery Instructions   1) If you are recuperating from surgery anywhere other than home, please be sure to leave [place] the number where you can be reached.  2) Go directly home and rest.  3) Keep the operated foot(feet) elevated six inches above the hip when sitting or lying down. This will help control swelling and pain.  4) Support the elevated foot and leg with pillows. DO NOT PLACE PILLOWS UNDER THE KNEE.  5) DO NOT REMOVE or get your bandages WET, unless you were given different instructions by your doctor to do so. This increases the risk of infection.  6) Wear your surgical shoe or surgical boot at all times when you are up on your feet.  7) A limited amount of pain and swelling may occur. The skin may take on a bruised appearance. DO NOT BE ALARMED, THIS IS NORMAL.  8) For slight pain and swelling, apply an ice pack directly over the bandages for 15 minutes only out of each hour of the day. Continue until seen in the office for your first post op visit. DON NOT APPLY ANY FORM OF HEAT TO THE AREA.  9) Have prescriptions filled immediately and take as directed.  10) Drink lots of liquids, water and juice to stay hydrated.  11) CALL IMMEDIATELY IF:  *Bleeding continues until the following day of surgery  *Pain increases and/or does not respond to medication  *Bandages or cast appears to tight  *If your bandage gets wet  *Trip, fall or stump your surgical foot  *If your temperature goes above 101  *If you have ANY questions at all  YOU NOW CONTROL THE EFFORT OF YOUR RECOVERY. ADHERING TO THESE INSTRUCTIONS WILL OFFER YOU THE MOST COMPLETE RESULTS     Post Anesthesia Home Care Instructions  Activity: Get plenty of rest for the remainder of the day. A responsible individual must stay with you for 24 hours following the procedure.  For the next 24 hours, DO NOT: -Drive a car -Advertising copywriter -Drink alcoholic beverages -Take any medication unless instructed by your  physician -Make any legal decisions or sign important papers.  Meals: Start with liquid foods such as gelatin or soup. Progress to regular foods as tolerated. Avoid greasy, spicy, heavy foods. If nausea and/or vomiting occur, drink only clear liquids until the nausea and/or vomiting subsides. Call your physician if vomiting continues.  Special Instructions/Symptoms: Your throat may feel dry or sore from the anesthesia or the breathing tube placed in your throat during surgery. If this causes discomfort, gargle with warm salt water. The discomfort should disappear within 24 hours.  Information for Discharge Teaching: EXPAREL (bupivacaine liposome injectable suspension)   Your surgeon or anesthesiologist gave you EXPAREL(bupivacaine) to help control your pain after surgery.  EXPAREL is a local anesthetic that provides pain relief by numbing the tissue around the surgical site. EXPAREL is designed to release pain medication over time and can control pain for up to 72 hours. Depending on how you respond to EXPAREL, you may require less pain medication during your recovery.  Possible side effects: Temporary loss of sensation or ability to move in the area where bupivacaine was injected. Nausea, vomiting, constipation Rarely, numbness and tingling in your mouth or lips, lightheadedness, or anxiety may occur. Call your doctor right away if you think you may be experiencing any of these sensations, or if you have other questions regarding possible side effects.  Follow all other discharge instructions given to you by your surgeon  or nurse. Eat a healthy diet and drink plenty of water or other fluids.  If you return to the hospital for any reason within 96 hours following the administration of EXPAREL, it is important for health care providers to know that you have received this anesthetic. A teal colored band has been placed on your arm with the date, time and amount of EXPAREL you have received in  order to alert and inform your health care providers. Please leave this armband in place for the full 96 hours following administration, and then you may remove the band.   Do not remove green armband before Friday, February 05, 2022.

## 2022-02-01 NOTE — Interval H&P Note (Signed)
History and Physical Interval Note:  02/01/2022 7:31 AM  Betty Arroyo  has presented today for surgery, with the diagnosis of ARTHRITIS LEFT FOOT.  The various methods of treatment have been discussed with the patient and family. After consideration of risks, benefits and other options for treatment, the patient has consented to  Procedure(s) with comments: HALLUX FUSION, METATARSAL PHALANGEAL JOINT (Left) - WITH BLOCK as a surgical intervention.  The patient's history has been reviewed, patient examined, no change in status, stable for surgery.  I have reviewed the patient's chart and labs.  Questions were answered to the patient's satisfaction.     Candelaria Stagers

## 2022-02-01 NOTE — Anesthesia Procedure Notes (Signed)
Procedure Name: LMA Insertion Date/Time: 02/01/2022 7:44 AM  Performed by: Jessica Priest, CRNAPre-anesthesia Checklist: Patient identified, Emergency Drugs available, Suction available, Patient being monitored and Timeout performed Patient Re-evaluated:Patient Re-evaluated prior to induction Oxygen Delivery Method: Circle system utilized Preoxygenation: Pre-oxygenation with 100% oxygen Induction Type: IV induction Ventilation: Mask ventilation without difficulty LMA: LMA inserted LMA Size: 4.0 Number of attempts: 1 Airway Equipment and Method: Bite block Placement Confirmation: positive ETCO2, breath sounds checked- equal and bilateral and CO2 detector Tube secured with: Tape Dental Injury: Teeth and Oropharynx as per pre-operative assessment

## 2022-02-01 NOTE — Anesthesia Postprocedure Evaluation (Signed)
Anesthesia Post Note  Patient: Betty Arroyo  Procedure(s) Performed: HALLUX FUSION, METATARSAL PHALANGEAL JOINT (Left: Foot)     Patient location during evaluation: PACU Anesthesia Type: Regional and General Level of consciousness: awake and alert Pain management: pain level controlled Vital Signs Assessment: post-procedure vital signs reviewed and stable Respiratory status: spontaneous breathing, nonlabored ventilation, respiratory function stable and patient connected to nasal cannula oxygen Cardiovascular status: blood pressure returned to baseline and stable Postop Assessment: no apparent nausea or vomiting Anesthetic complications: no   No notable events documented.  Last Vitals:  Vitals:   02/01/22 0715 02/01/22 0859  BP: 110/60 (!) 143/64  Pulse: 61   Resp:    Temp:  36.6 C  SpO2: 97%     Last Pain:  Vitals:   02/01/22 0558  TempSrc: Oral  PainSc: 0-No pain                 [name]

## 2022-02-01 NOTE — Op Note (Signed)
Surgeon: Surgeon(s): Candelaria Stagers, DPM  Assistants: None Pre-operative diagnosis: ARTHRITIS LEFT FOOT  Post-operative diagnosis: same Procedure: Procedure(s) (LRB): HALLUX FUSION, METATARSAL PHALANGEAL JOINT (Left)  Left removal of hardware Pathology: hardware removed Pertinent Intra-op findings: Severe osteoarthrosis noted Anesthesia: General  Hemostasis:  Total Tourniquet Time Documented: Calf (Left) - 50 minutes Total: Calf (Left) - 50 minutes  EBL: 2 mL  Materials: Crossroads plate screws combination, 3-0 Monocryl, 4-0 Monocryl, 5-0 Monocryl Injectables: None Complications: None  Indications for surgery: A 76 y.o. female presents with left painful severe osteoarthritis. Patient has failed all conservative therapy including but not limited to injection immobilization. She wishes to have surgical correction of the foot/deformity. It was determined that patient would benefit from left first MPJ arthrodesis with removal of hardware. Informed surgical risk consent was reviewed and read aloud to the patient.  I reviewed the films.  I have discussed my findings with the patient in great detail.  I have discussed all risks including but not limited to infection, stiffness, scarring, limp, disability, deformity, damage to blood vessels and nerves, numbness, poor healing, need for braces, arthritis, chronic pain, amputation, death.  All benefits and realistic expectations discussed in great detail.  I have made no promises as to the outcome.  I have provided realistic expectations.  I have offered the patient a 2nd opinion, which they have declined and assured me they preferred to proceed despite the risks   Procedure in detail: The patient was both verbally and visually identified by myself, the nursing staff, and anesthesia staff in the preoperative holding area. They were then transferred to the operating room and placed on the operative table in supine position.    Attention was directed  to the dorsal aspect of the left first metatarsophalangeal joint where a linear skin incision approximately 6cm long was made in the skin using a #15 blade. This incision was carried down through the subcutaneous tissue taking care to clamp and cauterize all neurovascular structures as necessary.  The capsule of 1st MPJ was identified. A linear capsulotomy was then performed in-line with the original skin incision and the capsule was reflected both medially and laterally to expose the head of the first metatarsal and the base of the proximal phalanx. The articular surface of the 1st metatarsal head was noted to show evidence of []  degeneration under direct visualization. The sagittal saw was then used to resect the dorsal, medial, and lateral prominences off of the 1st metatarsal. A rongeur was utilized to remove the dorsal exostosis of the proximal phalanx. A curette was used to remove some of the cartilage, and the remaining cartilage was removed using a burr until punctate bleeding could be noted on the metatarsal head and base of proximal phalanx. The site as flushed with copious amounts of sterile saline.  The distal aspect of the 1st metatarsal and the base of the proximal phalanx were then subchondrally drilled utilizing a pineapple burr. A Temporary K wire was then placed from distal-medial to proximal-lateral across the 1st MPJ, and the position was confirmed to be satisfactory utilizing fluoroscopy..  It was determined that the hardware will be in the way from applying the plate.  At this time both K wires were removed in its entirety without complication.  A dorsal right1st MPJ fusion locking plate was then applied and secured with two olive wires.  A crossroads staple was utilized through the dorsal plate to hold the fixation.The position was confirmed to be satisfactory with fluoroscopy, and two  screws were placed both proximally and distally for a total of four Nonlocking/locking screws utilized. The  1st MPJ was then stressed intraoperatively and no motion or gapping was noted across the fusion site. Final imaging via fluoroscopy was taken to confirm adequate placement and rectus position of the hallux. The surgical site was copiously irrigated with sterile saline. The capsule was then reapproximated with 3-0 Monocortical in a simple interrupted fashion. The subcutaneous tissue was then reapproximated with 4-0 monocryl in a running fashion. The subcuticular was performed utilizing 5-0 Monocryl.  At the conclusion of the procedure the patient was awoken from anesthesia and found to have tolerated the procedure well any complications. There were transferred to PACU with vital signs stable and vascular status intact.  Nicholes Rough, DPM

## 2022-02-02 ENCOUNTER — Encounter (HOSPITAL_BASED_OUTPATIENT_CLINIC_OR_DEPARTMENT_OTHER): Payer: Self-pay | Admitting: Podiatry

## 2022-02-09 ENCOUNTER — Encounter: Payer: Medicare PPO | Admitting: Internal Medicine

## 2022-02-10 ENCOUNTER — Ambulatory Visit (INDEPENDENT_AMBULATORY_CARE_PROVIDER_SITE_OTHER): Payer: Medicare PPO | Admitting: Podiatry

## 2022-02-10 ENCOUNTER — Ambulatory Visit (INDEPENDENT_AMBULATORY_CARE_PROVIDER_SITE_OTHER): Payer: Medicare PPO

## 2022-02-10 DIAGNOSIS — Z9889 Other specified postprocedural states: Secondary | ICD-10-CM

## 2022-02-10 DIAGNOSIS — M19072 Primary osteoarthritis, left ankle and foot: Secondary | ICD-10-CM | POA: Diagnosis not present

## 2022-02-10 NOTE — Progress Notes (Signed)
Subjective:  Patient ID: Betty Arroyo, female    DOB: 1945-12-02,  MRN: [id number]  Chief Complaint  Patient presents with   Foot Problem    LT 1ST MPJ FUSION    DOS: 02/01/2022 Procedure: Left first metatarsophalangeal joint fusion  76 y.o. female returns for post-op check.  Patient states that she is doing okay.  No acute complaints pain is manageable.  She has been nonweightbearing to the left lower extremity.  She is a diabetic.  Review of Systems: Negative except as noted in the HPI. Denies N/V/F/Ch.  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 TAKE 2 TABLETS BY MOUTH TWICE DAILY WITH FOOD), Disp: 180 tablet, Rfl: 0   Cholecalciferol (VITAMIN D3) 50 MCG (2000 UT) capsule, Take 2,000 Units by mouth at bedtime., Disp: , Rfl:    empagliflozin (JARDIANCE) 10 MG TABS tablet, Take 1 tablet (10 mg total) by mouth daily. (Patient taking differently: Take 10 mg by mouth at bedtime.), Disp: 90 tablet, Rfl: 2   fluconazole (DIFLUCAN) 100 MG tablet, Take one tablet by mouth then repeat in 48 hours, Disp: 2 tablet, Rfl: 0   ibuprofen (ADVIL) 800 MG tablet, Take 1 tablet (800 mg total) by mouth every 6 (six) hours as needed., Disp: 60 tablet, Rfl: 1   meclizine (ANTIVERT) 25 MG tablet, Take 1 tablet (25 mg total) by mouth 3 (three) times daily as needed for dizziness. (Patient taking  differently: Take 25 mg by mouth 3 (three) times daily as needed for dizziness.), Disp: 30 tablet, Rfl: 0   oxyCODONE-acetaminophen (PERCOCET) 5-325 MG tablet, Take 1 tablet by mouth every 4 (four) hours as needed for severe pain., Disp: 30 tablet, Rfl: 0   rosuvastatin (CRESTOR) 40 MG tablet, Take 1 tablet (40 mg total) by mouth daily. (Patient taking differently: Take 40 mg by mouth at bedtime.), Disp: 90 tablet, Rfl: 2   Semaglutide,0.25 or 0.5MG /DOS, 2 MG/3ML SOPN, Inject 0.25 mg into the skin once a week. (Patient taking differently: Inject 0.25 mg into the skin once a week. Monday's), Disp: 6 mL, Rfl: 2   triamcinolone cream (KENALOG) 0.1 %, Apply 1 application topically 2 (two) times daily. (Patient taking differently: Apply 1 application  topically 2 (two) times daily as needed.), Disp: 30 g, Rfl: 3   vitamin C (ASCORBIC ACID) 500 MG tablet, Take 500 mg by mouth at bedtime., Disp: , Rfl:   Social History   Tobacco Use  Smoking Status Never  Smokeless Tobacco Never    Allergies  Allergen Reactions   Farxiga [Dapagliflozin] Rash   Objective:  There were no vitals filed for this visit. There is no height or weight on file to calculate BMI. Constitutional Well developed. Well nourished.  Vascular Foot warm and well perfused. Capillary refill normal to  all digits.   Neurologic Normal speech. Oriented to person, place, and time. Epicritic sensation to light touch grossly present bilaterally.  Dermatologic Skin healing well without signs of infection. Skin edges well coapted without signs of infection.  Orthopedic: Tenderness to palpation noted about the surgical site.   Radiographs: 3 views of skeletally mature left foot: Good correction alignment noted hardware is intact.  No signs of backing or loosening noted. Assessment:   1. Post-operative state   2. Arthritis of first metatarsophalangeal (MTP) joint of left foot    Plan:  Patient was evaluated and treated and all  questions answered.  S/p foot surgery left -Progressing as expected post-operatively. -XR: See above -WB Status: Nonweightbearing in a cam boot -Sutures: Intact.  No medical signs of Deis is noted no complication noted. -Medications: None -Foot redressed.  No follow-ups on file.

## 2022-02-24 ENCOUNTER — Encounter: Payer: Medicare PPO | Admitting: Podiatry

## 2022-02-25 ENCOUNTER — Ambulatory Visit (INDEPENDENT_AMBULATORY_CARE_PROVIDER_SITE_OTHER): Payer: Medicare PPO | Admitting: Podiatry

## 2022-02-25 DIAGNOSIS — Z9889 Other specified postprocedural states: Secondary | ICD-10-CM

## 2022-02-25 DIAGNOSIS — M19072 Primary osteoarthritis, left ankle and foot: Secondary | ICD-10-CM

## 2022-02-25 NOTE — Progress Notes (Signed)
Subjective:  Patient ID: Betty Arroyo, female    DOB: 06-09-1946,  MRN: [id number]  Chief Complaint  Patient presents with   Routine Post Op    POV #2 DOS 02/01/2022 LT 1ST MPJ FUSION    DOS: 02/01/2022 Procedure: Left first metatarsophalangeal joint fusion  76 y.o. female returns for post-op check.  She states she does not have any pain she is doing okay.  Denies any other acute complaints..  Review of Systems: Negative except as noted in the HPI. Denies N/V/F/Ch.  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 TAKE 2 TABLETS BY MOUTH TWICE DAILY WITH FOOD), Disp: 180 tablet, Rfl: 0   Cholecalciferol (VITAMIN D3) 50 MCG (2000 UT) capsule, Take 2,000 Units by mouth at bedtime., Disp: , Rfl:    empagliflozin (JARDIANCE) 10 MG TABS tablet, Take 1 tablet (10 mg total) by mouth daily. (Patient taking differently: Take 10 mg by mouth at bedtime.), Disp: 90 tablet, Rfl: 2   fluconazole (DIFLUCAN) 100 MG tablet, Take one tablet by mouth then repeat in 48 hours, Disp: 2 tablet, Rfl: 0   ibuprofen (ADVIL) 800 MG tablet, Take 1 tablet (800 mg total) by mouth every 6 (six) hours as needed., Disp: 60 tablet, Rfl: 1   meclizine (ANTIVERT) 25 MG tablet, Take 1 tablet (25 mg total) by mouth 3 (three) times daily as needed for dizziness. (Patient taking differently: Take 25 mg by mouth 3 (three) times  daily as needed for dizziness.), Disp: 30 tablet, Rfl: 0   oxyCODONE-acetaminophen (PERCOCET) 5-325 MG tablet, Take 1 tablet by mouth every 4 (four) hours as needed for severe pain., Disp: 30 tablet, Rfl: 0   rosuvastatin (CRESTOR) 40 MG tablet, Take 1 tablet (40 mg total) by mouth daily. (Patient taking differently: Take 40 mg by mouth at bedtime.), Disp: 90 tablet, Rfl: 2   Semaglutide,0.25 or 0.5MG /DOS, 2 MG/3ML SOPN, Inject 0.25 mg into the skin once a week. (Patient taking differently: Inject 0.25 mg into the skin once a week. Monday's), Disp: 6 mL, Rfl: 2   triamcinolone cream (KENALOG) 0.1 %, Apply 1 application topically 2 (two) times daily. (Patient taking differently: Apply 1 application  topically 2 (two) times daily as needed.), Disp: 30 g, Rfl: 3   vitamin C (ASCORBIC ACID) 500 MG tablet, Take 500 mg by mouth at bedtime., Disp: , Rfl:   Social History   Tobacco Use  Smoking Status Never  Smokeless Tobacco Never    Allergies  Allergen Reactions   Farxiga [Dapagliflozin] Rash   Objective:  There were no vitals filed for this visit. There is no height or weight on file to calculate BMI. Constitutional Well developed. Well nourished.  Vascular Foot warm and well perfused. Capillary refill normal to all digits.   Neurologic Normal  speech. Oriented to person, place, and time. Epicritic sensation to light touch grossly present bilaterally.  Dermatologic Skin completely reepithelialized.  No signs of dehiscence is noted no complication noted.  Orthopedic: Tenderness to palpation noted about the surgical site.   Radiographs: 3 views of skeletally mature left foot: Good correction alignment noted hardware is intact.  No signs of backing or loosening noted. Assessment:   No diagnosis found.  Plan:  Patient was evaluated and treated and all questions answered.  S/p foot surgery left -Progressing as expected post-operatively. -XR: See above -WB Status: Weightbearing as  tolerated in surgical shoe -Sutures: Removed no medical signs of Deis is noted no complication noted. -Medications: None -She will follow back up for final postop x-rays and discharge in 4 weeks.  No follow-ups on file.

## 2022-03-13 ENCOUNTER — Other Ambulatory Visit: Payer: Self-pay | Admitting: Internal Medicine

## 2022-03-15 ENCOUNTER — Other Ambulatory Visit: Payer: Self-pay

## 2022-03-15 MED ORDER — CARVEDILOL 6.25 MG PO TABS: ORAL_TABLET | ORAL | 0 refills | Status: DC

## 2022-03-15 MED ORDER — EZETIMIBE 10 MG PO TABS: 10.0000 mg | ORAL_TABLET | Freq: Every day | ORAL | 0 refills | Status: DC

## 2022-03-15 MED ORDER — CARVEDILOL 6.25 MG PO TABS: 6.2500 mg | ORAL_TABLET | Freq: Two times a day (BID) | ORAL | 0 refills | Status: DC

## 2022-03-24 ENCOUNTER — Ambulatory Visit (INDEPENDENT_AMBULATORY_CARE_PROVIDER_SITE_OTHER): Payer: Medicare PPO

## 2022-03-24 ENCOUNTER — Telehealth: Payer: Self-pay | Admitting: Podiatry

## 2022-03-24 ENCOUNTER — Ambulatory Visit: Payer: Medicare PPO | Admitting: Podiatry

## 2022-03-24 DIAGNOSIS — M19072 Primary osteoarthritis, left ankle and foot: Secondary | ICD-10-CM

## 2022-03-24 DIAGNOSIS — Z9889 Other specified postprocedural states: Secondary | ICD-10-CM

## 2022-03-24 NOTE — Progress Notes (Signed)
Subjective:  Patient ID: Betty Arroyo, female    DOB: 04/18/1946,  MRN: [id number]  No chief complaint on file.   DOS: 02/01/2022 Procedure: Left first metatarsophalangeal joint fusion  76 y.o. female returns for post-op check.  She states she does not have any pain she is doing okay.  Denies any other acute complaints..  Review of Systems: Negative except as noted in the HPI. Denies N/V/F/Ch.  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 (Patient taking differently: Take 10 mg by mouth at bedtime.), Disp: 90 tablet, Rfl: 2   ezetimibe (ZETIA) 10 MG tablet, Take 1 tablet (10 mg total) by mouth daily., Disp: 90 tablet, Rfl: 0   fluconazole (DIFLUCAN) 100 MG tablet, Take one tablet by mouth then repeat in 48 hours, Disp: 2 tablet, Rfl: 0   ibuprofen (ADVIL) 800 MG tablet, Take 1 tablet (800 mg total) by mouth every 6 (six) hours as needed., Disp: 60 tablet, Rfl: 1   meclizine (ANTIVERT) 25 MG tablet, Take 1 tablet (25 mg total) by mouth 3 (three) times daily as needed for dizziness. (Patient taking differently: Take 25 mg by mouth 3 (three) times daily as needed for dizziness.), Disp: 30 tablet, Rfl: 0   oxyCODONE-acetaminophen (PERCOCET) 5-325 MG  tablet, Take 1 tablet by mouth every 4 (four) hours as needed for severe pain., Disp: 30 tablet, Rfl: 0   rosuvastatin (CRESTOR) 40 MG tablet, Take 1 tablet (40 mg total) by mouth daily. (Patient taking differently: Take 40 mg by mouth at bedtime.), Disp: 90 tablet, Rfl: 2   Semaglutide,0.25 or 0.5MG /DOS, 2 MG/3ML SOPN, Inject 0.25 mg into the skin once a week. (Patient taking differently: Inject 0.25 mg into the skin once a week. Monday's), Disp: 6 mL, Rfl: 2   triamcinolone cream (KENALOG) 0.1 %, Apply 1 application topically 2 (two) times daily. (Patient taking differently: Apply 1 application  topically 2 (two) times daily as needed.), Disp: 30 g, Rfl: 3   vitamin C (ASCORBIC ACID) 500 MG tablet, Take 500 mg by mouth at bedtime., Disp: , Rfl:   Social History   Tobacco Use  Smoking Status Never  Smokeless Tobacco Never    Allergies  Allergen Reactions   Farxiga [Dapagliflozin] Rash   Objective:  There were no vitals filed for this visit. There is no height or weight on file to calculate BMI. Constitutional Well developed. Well nourished.  Vascular Foot warm and well perfused. Capillary refill normal to all digits.   Neurologic Normal speech. Oriented to person, place, and time. Epicritic sensation to light touch grossly present bilaterally.  Dermatologic Skin completely reepithelialized.  No signs of dehiscence is noted no complication noted.  Rigid first MTPJ joint noted.  No motion noted.  Orthopedic: No further tenderness to palpation noted about the surgical site.   Radiographs: 3 views of skeletally mature left foot: Good correction alignment noted hardware is intact.  No signs of backing or loosening noted. Assessment:   1. Arthritis of first metatarsophalangeal (MTP) joint of left foot     Plan:  Patient was evaluated and treated and all questions answered.  S/p foot surgery left -Clinically healed and officially discharged from my care if any foot and ankle issues  arise in future of asked her to come back and see me.  I discussed shoe gear modification as well.  She states understanding if any foot and ankle issues arise she will come back and see me  No follow-ups on file.

## 2022-03-24 NOTE — Telephone Encounter (Signed)
Pt called back to see if medication was sent to pharmacy. Pt was seen today and medication for nerve pain was discussed. Pt unsure of name of medicaton.  [place], [place] RD  Please advise.

## 2022-03-25 MED ORDER — GABAPENTIN 100 MG PO CAPS: 100.0000 mg | ORAL_CAPSULE | Freq: Three times a day (TID) | ORAL | 3 refills | Status: DC

## 2022-03-25 NOTE — Addendum Note (Signed)
Addended by: Boneta Lucks on: 03/25/2022 08:14 AM   Modules accepted: Orders

## 2022-03-26 NOTE — Telephone Encounter (Signed)
Patient notified

## 2022-04-01 ENCOUNTER — Ambulatory Visit (INDEPENDENT_AMBULATORY_CARE_PROVIDER_SITE_OTHER): Payer: Medicare PPO

## 2022-04-01 ENCOUNTER — Ambulatory Visit: Payer: Medicare PPO | Admitting: Internal Medicine

## 2022-04-01 VITALS — BP 124/60 | HR 76 | Temp 98.1°F | Ht 61.2 in | Wt 151.2 lb

## 2022-04-01 DIAGNOSIS — Z0000 Encounter for general adult medical examination without abnormal findings: Secondary | ICD-10-CM | POA: Diagnosis not present

## 2022-04-01 NOTE — Patient Instructions (Signed)
Betty Arroyo , Thank you for taking time to come for your Medicare Wellness Visit. I appreciate your ongoing commitment to your health goals. Please review the following plan we discussed and let me know if I can assist you in the future.   Screening recommendations/referrals: Colonoscopy: completed 04/14/2018 Mammogram: completed 01/20/2022, due 01/22/2023 Bone Density: completed 01/01/2021 Recommended yearly ophthalmology/optometry visit for glaucoma screening and checkup Recommended yearly dental visit for hygiene and checkup  Vaccinations: Influenza vaccine: checking at pharmacy Pneumococcal vaccine: completed 04/24/2019 Tdap vaccine: completed 05/04/2012, due 05/04/2022 Shingles vaccine: completed   Covid-19: 06/02/2020, 08/18/2019, 07/28/2019  Advanced directives: Please bring a copy of your POA (Power of Attorney) and/or Living Will to your next appointment.   Conditions/risks identified: none  Next appointment: Follow up in one year for your annual wellness visit    Preventive Care 65 Years and Older, Female Preventive care refers to lifestyle choices and visits with your health care provider that can promote health and wellness. What does preventive care include? A yearly physical exam. This is also called an annual well check. Dental exams once or twice a year. Routine eye exams. Ask your health care provider how often you should have your eyes checked. Personal lifestyle choices, including: Daily care of your teeth and gums. Regular physical activity. Eating a healthy diet. Avoiding tobacco and drug use. Limiting alcohol use. Practicing safe sex. Taking low-dose aspirin every day. Taking vitamin and mineral supplements as recommended by your health care provider. What happens during an annual well check? The services and screenings done by your health care provider during your annual well check will depend on your age, overall health, lifestyle risk factors, and family  history of disease. Counseling  Your health care provider may ask you questions about your: Alcohol use. Tobacco use. Drug use. Emotional well-being. Home and relationship well-being. Sexual activity. Eating habits. History of falls. Memory and ability to understand (cognition). Work and work Statistician. Reproductive health. Screening  You may have the following tests or measurements: Height, weight, and BMI. Blood pressure. Lipid and cholesterol levels. These may be checked every 5 years, or more frequently if you are over 4 years old. Skin check. Lung cancer screening. You may have this screening every year starting at age 65 if you have a 30-pack-year history of smoking and currently smoke or have quit within the past 15 years. Fecal occult blood test (FOBT) of the stool. You may have this test every year starting at age 41. Flexible sigmoidoscopy or colonoscopy. You may have a sigmoidoscopy every 5 years or a colonoscopy every 10 years starting at age 29. Hepatitis C blood test. Hepatitis B blood test. Sexually transmitted disease (STD) testing. Diabetes screening. This is done by checking your blood sugar (glucose) after you have not eaten for a while (fasting). You may have this done every 1-3 years. Bone density scan. This is done to screen for osteoporosis. You may have this done starting at age 48. Mammogram. This may be done every 1-2 years. Talk to your health care provider about how often you should have regular mammograms. Talk with your health care provider about your test results, treatment options, and if necessary, the need for more tests. Vaccines  Your health care provider may recommend certain vaccines, such as: Influenza vaccine. This is recommended every year. Tetanus, diphtheria, and acellular pertussis (Tdap, Td) vaccine. You may need a Td booster every 10 years. Zoster vaccine. You may need this after age 47. Pneumococcal 13-valent conjugate (PCV13)  vaccine. One dose is recommended after age 24. Pneumococcal polysaccharide (PPSV23) vaccine. One dose is recommended after age 53. Talk to your health care provider about which screenings and vaccines you need and how often you need them. This information is not intended to replace advice given to you by your health care provider. Make sure you discuss any questions you have with your health care provider. Document Released: 07/04/2015 Document Revised: 02/25/2016 Document Reviewed: 04/08/2015 Elsevier Interactive Patient Education  2017 [place] Prevention in the Home Falls can cause injuries. They can happen to people of all ages. There are many things you can do to make your home safe and to help prevent falls. What can I do on the outside of my home? Regularly fix the edges of walkways and driveways and fix any cracks. Remove anything that might make you trip as you walk through a door, such as a raised step or threshold. Trim any bushes or trees on the path to your home. Use bright outdoor lighting. Clear any walking paths of anything that might make someone trip, such as rocks or tools. Regularly check to see if handrails are loose or broken. Make sure that both sides of any steps have handrails. Any raised decks and porches should have guardrails on the edges. Have any leaves, snow, or ice cleared regularly. Use sand or salt on walking paths during winter. Clean up any spills in your garage right away. This includes oil or grease spills. What can I do in the bathroom? Use night lights. Install grab bars by the toilet and in the tub and shower. Do not use towel bars as grab bars. Use non-skid mats or decals in the tub or shower. If you need to sit down in the shower, use a plastic, non-slip stool. Keep the floor dry. Clean up any water that spills on the floor as soon as it happens. Remove soap buildup in the tub or shower regularly. Attach bath mats securely with  double-sided non-slip rug tape. Do not have throw rugs and other things on the floor that can make you trip. What can I do in the bedroom? Use night lights. Make sure that you have a light by your bed that is easy to reach. Do not use any sheets or blankets that are too big for your bed. They should not hang down onto the floor. Have a firm chair that has side arms. You can use this for support while you get dressed. Do not have throw rugs and other things on the floor that can make you trip. What can I do in the kitchen? Clean up any spills right away. Avoid walking on wet floors. Keep items that you use a lot in easy-to-reach places. If you need to reach something above you, use a strong step stool that has a grab bar. Keep electrical cords out of the way. Do not use floor polish or wax that makes floors slippery. If you must use wax, use non-skid floor wax. Do not have throw rugs and other things on the floor that can make you trip. What can I do with my stairs? Do not leave any items on the stairs. Make sure that there are handrails on both sides of the stairs and use them. Fix handrails that are broken or loose. Make sure that handrails are as long as the stairways. Check any carpeting to make sure that it is firmly attached to the stairs. Fix any carpet that is loose or  worn. Avoid having throw rugs at the top or bottom of the stairs. If you do have throw rugs, attach them to the floor with carpet tape. Make sure that you have a light switch at the top of the stairs and the bottom of the stairs. If you do not have them, ask someone to add them for you. What else can I do to help prevent falls? Wear shoes that: Do not have high heels. Have rubber bottoms. Are comfortable and fit you well. Are closed at the toe. Do not wear sandals. If you use a stepladder: Make sure that it is fully opened. Do not climb a closed stepladder. Make sure that both sides of the stepladder are locked  into place. Ask someone to hold it for you, if possible. Clearly mark and make sure that you can see: Any grab bars or handrails. First and last steps. Where the edge of each step is. Use tools that help you move around (mobility aids) if they are needed. These include: Canes. Walkers. Scooters. Crutches. Turn on the lights when you go into a dark area. Replace any light bulbs as soon as they burn out. Set up your furniture so you have a clear path. Avoid moving your furniture around. If any of your floors are uneven, fix them. If there are any pets around you, be aware of where they are. Review your medicines with your doctor. Some medicines can make you feel dizzy. This can increase your chance of falling. Ask your doctor what other things that you can do to help prevent falls. This information is not intended to replace advice given to you by your health care provider. Make sure you discuss any questions you have with your health care provider. Document Released: 04/03/2009 Document Revised: 11/13/2015 Document Reviewed: 07/12/2014 Elsevier Interactive Patient Education  2017 [employer].

## 2022-04-01 NOTE — Progress Notes (Signed)
Subjective:   Betty Arroyo is a 76 y.o. female who presents for Medicare Annual (Subsequent) preventive examination.  Review of Systems     Cardiac Risk Factors include: advanced age (>32men, >46 women);diabetes mellitus;hypertension     Objective:    Today's Vitals   04/01/22 0910  BP: 124/60  Pulse: 76  Temp: 98.1 F (36.7 C)  TempSrc: Oral  Weight: 151 lb 3.2 oz (68.6 kg)  Height: 5' 1.2" (1.554 m)   Body mass index is 28.38 kg/m.     04/01/2022    9:24 AM 02/01/2022    5:55 AM 03/05/2021    9:36 AM 12/12/2019    9:49 AM 02/16/2019   11:40 AM 02/13/2019    2:00 PM 12/06/2018   10:23 AM  Advanced Directives  Does Patient Have a Medical Advance Directive? Yes Yes Yes Yes Yes Yes Yes  Type of Paramedic of [place];Living will [place];Living will [place];Living will [place];Living will [place];Living will  [place];Living will  Does patient want to make changes to medical advance directive?     No - Patient declined    Copy of [place] in Chart? No - copy requested No - copy requested No - copy requested No - copy requested No - copy requested  No - copy requested    Current Medications (verified) Outpatient Encounter Medications as of 04/01/2022  Medication Sig   acetaminophen (TYLENOL) 500 MG tablet Take 1,000 mg by mouth every 6 (six) hours as needed.   carvedilol (COREG) 6.25 MG tablet TAKE 2 TABLETS BY MOUTH TWICE DAILY WITH FOOD   Cholecalciferol (VITAMIN D3) 50 MCG (2000 UT) capsule Take 2,000 Units by mouth at bedtime.   empagliflozin (JARDIANCE) 10 MG TABS tablet Take 1 tablet (10 mg total) by mouth daily. (Patient taking differently: Take 10 mg by mouth at bedtime.)   ezetimibe (ZETIA) 10 MG tablet Take 1 tablet (10 mg total) by mouth daily.   gabapentin (NEURONTIN) 100 MG capsule Take 1 capsule (100 mg total) by  mouth 3 (three) times daily.   ibuprofen (ADVIL) 800 MG tablet Take 1 tablet (800 mg total) by mouth every 6 (six) hours as needed.   meclizine (ANTIVERT) 25 MG tablet Take 1 tablet (25 mg total) by mouth 3 (three) times daily as needed for dizziness. (Patient taking differently: Take 25 mg by mouth 3 (three) times daily as needed for dizziness.)   rosuvastatin (CRESTOR) 40 MG tablet Take 1 tablet (40 mg total) by mouth daily. (Patient taking differently: Take 40 mg by mouth at bedtime.)   Semaglutide,0.25 or 0.5MG /DOS, 2 MG/3ML SOPN Inject 0.25 mg into the skin once a week. (Patient taking differently: Inject 0.25 mg into the skin once a week. Monday's)   triamcinolone cream (KENALOG) 0.1 % Apply 1 application topically 2 (two) times daily. (Patient taking differently: Apply 1 application  topically 2 (two) times daily as needed.)   vitamin C (ASCORBIC ACID) 500 MG tablet Take 500 mg by mouth at bedtime.   fluconazole (DIFLUCAN) 100 MG tablet Take one tablet by mouth then repeat in 48 hours (Patient not taking: Reported on 04/01/2022)   oxyCODONE-acetaminophen (PERCOCET) 5-325 MG tablet Take 1 tablet by mouth every 4 (four) hours as needed for severe pain. (Patient not taking: Reported on 04/01/2022)   No facility-administered encounter medications on file as of 04/01/2022.    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   Right > left leg   Vertigo, benign paroxysmal    Wears glasses    Past Surgical History:  Procedure Laterality Date   COLONOSCOPY  2019   HALLUX FUSION Left 02/01/2022   Procedure: HALLUX FUSION, METATARSAL PHALANGEAL JOINT;  Surgeon: Felipa Furnace, DPM;  Location: [facility];  Service: Podiatry;  Laterality: Left;  WITH BLOCK   TONSILLECTOMY  1984   TOTAL KNEE ARTHROPLASTY Left 02/16/2019   Procedure: TOTAL KNEE ARTHROPLASTY;  Surgeon: Dorna Leitz, MD;  Location: [facility];  Service: Orthopedics;  Laterality: Left;   VAGINAL HYSTERECTOMY     age 57   Family History  Problem Relation Age of Onset   Diabetes Mother    Hyperlipidemia Mother    Hypertension Mother    Varicose Veins Mother    Hyperlipidemia Brother    Hypertension Brother    Social History   Socioeconomic History   Marital status: Married    Spouse name: Not on file   Number of children: Not on file   Years of education: Not on file   Highest education level: Not on file  Occupational History   Occupation: retired  Tobacco Use   Smoking status: Never   Smokeless tobacco: Never  Vaping Use   Vaping Use: Never used  Substance and Sexual Activity   Alcohol use: No   Drug use: Never   Sexual activity: Yes    Birth control/protection: Post-menopausal  Other Topics Concern   Not on file  Social History Narrative   Not on file   Social Determinants of Health   Financial Resource Strain: Low Risk  (04/01/2022)   Overall Financial Resource Strain (CARDIA)    Difficulty of Paying Living Expenses: Not hard at all  Food Insecurity: No Food Insecurity (04/01/2022)   Hunger Vital Sign    Worried About Running Out of Food in the Last Year: Never true    [place] in the Last Year: Never true  Transportation Needs: No Transportation Needs (03/05/2021)   PRAPARE - Hydrologist (Medical): No    Lack of Transportation (Non-Medical): No  Physical Activity: Inactive (04/01/2022)   Exercise Vital Sign    Days of Exercise per Week: 0 days    Minutes of Exercise per Session: 0 min  Stress: No Stress Concern Present (04/01/2022)   [place]    Feeling of Stress : Not at all  Social  Connections: Not on file    Tobacco Counseling Counseling given: Not Answered   Clinical Intake:  Pre-visit preparation completed: Yes  Pain : No/denies pain     Nutritional Status: BMI 25 -29 Overweight Nutritional Risks: None Diabetes: Yes  How often do you need to have someone help you when you read instructions, pamphlets, or other written materials from your doctor or pharmacy?: 1 - Never  Diabetic? Yes Nutrition Risk Assessment:  Has the patient had any N/V/D within the last 2 months?  No  Does the patient have any non-healing wounds?  No  Has the patient had any unintentional weight loss or weight gain?  No   Diabetes:  Is the patient diabetic?  Yes  If diabetic, was a CBG obtained today?  No  Did the patient bring in their glucometer from home?  No  How often do you monitor your CBG's? Does not.   Financial Strains and Diabetes Management:  Are you having any financial strains with the device, your supplies or your medication? No .  Does the patient want to be seen by Chronic Care Management for management of their diabetes?  No  Would the patient like to be referred to a Nutritionist or for Diabetic Management?  No   Diabetic Exams:  Diabetic Eye Exam: Overdue for diabetic eye exam. Pt has been advised about the importance in completing this exam. Patient advised to call and schedule an eye exam. Diabetic Foot Exam: Overdue, Pt has been advised about the importance in completing this exam. Pt is scheduled for diabetic foot exam on next appointment.   Interpreter Needed?: No  Information entered by :: NAllen LPN   Activities of Daily Living    04/01/2022    9:24 AM 02/01/2022    6:00 AM  In your present state of health, do you have any difficulty performing the following activities:  Hearing? 0 0  Vision? 0 0  Difficulty concentrating or making decisions? 0 0  Walking or climbing stairs? 0 0  Dressing or bathing? 0 0  Doing errands, shopping? 0    Preparing Food and eating ? N   Using the Toilet? N   In the past six months, have you accidently leaked urine? N   Do you have problems with loss of bowel control? N   Managing your Medications? N   Managing your Finances? N   Housekeeping or managing your Housekeeping? N     Patient Care Team: Glendale Chard, MD as PCP - General (Internal Medicine)  Indicate any recent Medical Services you may have received from other than [facility] providers in the past year (date may be approximate).     Assessment:   This is a routine wellness examination for [place].  Hearing/Vision screen Vision Screening - Comments:: Regular eye exams, Dr. Nicoletta Dress  Dietary issues and exercise activities discussed: Current Exercise Habits: The patient does not participate in regular exercise at present   Goals Addressed             This Visit's Progress    Patient Stated       04/01/2022, get rid of diabetes       Depression Screen    04/01/2022    9:24 AM 03/05/2021    9:37 AM 12/29/2020   12:28 PM 12/12/2019    9:50 AM 08/14/2019    3:00 PM 01/25/2019    2:58 PM 12/06/2018   10:24 AM  PHQ 2/9 Scores  PHQ - 2 Score 0 0 0 0 0 0 0  PHQ- 9 Score   0    0    Fall Risk    04/01/2022    9:24 AM 03/05/2021    9:36 AM 12/29/2020   12:28 PM 12/12/2019    9:50 AM 01/25/2019    2:58 PM  Fall Risk   Falls in the past year? 0 0 0 0 0  Number falls in past yr: 0  0    Injury with Fall? 0  0    Risk for fall due to : Medication side effect Medication side effect  Medication side effect   Follow up Falls prevention discussed;Education provided;Falls  evaluation completed Falls evaluation completed;Education provided;Falls prevention discussed  Falls evaluation completed;Education provided;Falls prevention discussed     FALL RISK PREVENTION PERTAINING TO THE HOME:  Any stairs in or around the home? Yes  If so, are there any without handrails? No  Home free of loose throw rugs in walkways, pet beds, electrical  cords, etc? Yes  Adequate lighting in your home to reduce risk of falls? Yes   ASSISTIVE DEVICES UTILIZED TO PREVENT FALLS:  Life alert? No  Use of a cane, walker or w/c? No  Grab bars in the bathroom? Yes  Shower chair or bench in shower? Yes  Elevated toilet seat or a handicapped toilet? Yes   TIMED UP AND GO:  Was the test performed? Yes .  Length of time to ambulate 10 feet: 5 sec.   Gait steady and fast without use of assistive device  Cognitive Function:        04/01/2022    9:25 AM 03/05/2021    9:37 AM 12/12/2019    9:51 AM 12/06/2018   10:26 AM  6CIT Screen  What Year? 0 points 0 points 0 points 0 points  What month? 0 points 0 points 0 points 0 points  What time? 0 points 0 points 0 points 0 points  Count back from 20 0 points 0 points 2 points 0 points  Months in reverse 0 points 0 points 0 points 0 points  Repeat phrase 0 points 0 points 2 points 0 points  Total Score 0 points 0 points 4 points 0 points    Immunizations Immunization History  Administered Date(s) Administered   Fluad Quad(high Dose 65+) 04/08/2020, 03/05/2021   Influenza, High Dose Seasonal PF 07/24/2018, 04/24/2019   Influenza-Unspecified 04/24/2019   PFIZER(Purple Top)SARS-COV-2 Vaccination 07/28/2019, 08/18/2019, 06/02/2020   Pneumococcal Conjugate-13 04/24/2019   Pneumococcal Polysaccharide-23 10/07/2014   Zoster Recombinat (Shingrix) 08/22/2021    TDAP status: Up to date  Flu Vaccine status: Due, Education has been provided regarding the importance of this vaccine. Advised may receive this vaccine at local pharmacy or Health Dept. Aware to provide a copy of the vaccination record if obtained from local pharmacy or Health Dept. Verbalized acceptance and understanding.  Pneumococcal vaccine status: Up to date  Covid-19 vaccine status: Completed vaccines  Qualifies for Shingles Vaccine? Yes   Zostavax completed Yes   Shingrix Completed?: Yes  Screening Tests Health Maintenance   Topic Date Due   COVID-19 Vaccine (4 - Pfizer risk series) 07/28/2020   Zoster Vaccines- Shingrix (2 of 2) 10/17/2021   FOOT EXAM  12/29/2021   INFLUENZA VACCINE  01/19/2022   OPHTHALMOLOGY EXAM  03/23/2022   TETANUS/TDAP  05/04/2022   HEMOGLOBIN A1C  07/21/2022   Diabetic kidney evaluation - Urine ACR  10/01/2022   Diabetic kidney evaluation - GFR measurement  01/19/2023   MAMMOGRAM  01/21/2024   COLONOSCOPY (Pts 45-50yrs Insurance coverage will need to be confirmed)  04/14/2028   Pneumonia Vaccine 70+ Years old  Completed   DEXA SCAN  Completed   Hepatitis C Screening  Completed   HPV VACCINES  Aged Out    Health Maintenance  Health Maintenance Due  Topic Date Due   COVID-19 Vaccine (4 - Pfizer risk series) 07/28/2020   Zoster Vaccines- Shingrix (2 of 2) 10/17/2021   FOOT EXAM  12/29/2021   INFLUENZA VACCINE  01/19/2022   OPHTHALMOLOGY EXAM  03/23/2022    Colorectal cancer screening: Type of screening: Colonoscopy. Completed 04/14/2018. Repeat every 5 years  Mammogram  status: Completed 01/20/2022. Repeat every year  Bone Density status: Completed 01/01/2021.   Lung Cancer Screening: (Low Dose CT Chest recommended if Age 67-80 years, 30 pack-year currently smoking OR have quit w/in 15years.) does not qualify.   Lung Cancer Screening Referral: no  Additional Screening:  Hepatitis C Screening: does qualify; Completed 09/01/2012  Vision Screening: Recommended annual ophthalmology exams for early detection of glaucoma and other disorders of the eye. Is the patient up to date with their annual eye exam?  Yes  Who is the provider or what is the name of the office in which the patient attends annual eye exams? Dr. Nicoletta Dress, [facility] If pt is not established with a provider, would they like to be referred to a provider to establish care? No .   Dental Screening: Recommended annual dental exams for proper oral hygiene  Community Resource Referral / Chronic Care Management: CRR  required this visit?  No   CCM required this visit?  No      Plan:     I have personally reviewed and noted the following in the patient's chart:   Medical and social history Use of alcohol, tobacco or illicit drugs  Current medications and supplements including opioid prescriptions. Patient is not currently taking opioid prescriptions. Functional ability and status Nutritional status Physical activity Advanced directives List of other physicians Hospitalizations, surgeries, and ER visits in previous 12 months Vitals Screenings to include cognitive, depression, and falls Referrals and appointments  In addition, I have reviewed and discussed with patient certain preventive protocols, quality metrics, and best practice recommendations. A written personalized care plan for preventive services as well as general preventive health recommendations were provided to patient.     Kellie Simmering, LPN   [id number]   Nurse Notes: none

## 2022-04-29 ENCOUNTER — Other Ambulatory Visit: Payer: Self-pay

## 2022-04-29 MED ORDER — CARVEDILOL 6.25 MG PO TABS: ORAL_TABLET | ORAL | 1 refills | Status: DC

## 2022-05-05 ENCOUNTER — Encounter: Payer: Self-pay | Admitting: Internal Medicine

## 2022-05-05 ENCOUNTER — Ambulatory Visit (INDEPENDENT_AMBULATORY_CARE_PROVIDER_SITE_OTHER): Payer: Medicare PPO | Admitting: Internal Medicine

## 2022-05-05 VITALS — BP 150/82 | Temp 97.9°F | Ht 61.0 in | Wt 148.4 lb

## 2022-05-05 DIAGNOSIS — Z6828 Body mass index (BMI) 28.0-28.9, adult: Secondary | ICD-10-CM

## 2022-05-05 DIAGNOSIS — I129 Hypertensive chronic kidney disease with stage 1 through stage 4 chronic kidney disease, or unspecified chronic kidney disease: Secondary | ICD-10-CM

## 2022-05-05 DIAGNOSIS — M1611 Unilateral primary osteoarthritis, right hip: Secondary | ICD-10-CM

## 2022-05-05 DIAGNOSIS — Z23 Encounter for immunization: Secondary | ICD-10-CM

## 2022-05-05 DIAGNOSIS — E7800 Pure hypercholesterolemia, unspecified: Secondary | ICD-10-CM | POA: Diagnosis not present

## 2022-05-05 DIAGNOSIS — R2242 Localized swelling, mass and lump, left lower limb: Secondary | ICD-10-CM

## 2022-05-05 DIAGNOSIS — N1831 Chronic kidney disease, stage 3a: Secondary | ICD-10-CM

## 2022-05-05 DIAGNOSIS — E1122 Type 2 diabetes mellitus with diabetic chronic kidney disease: Secondary | ICD-10-CM

## 2022-05-05 DIAGNOSIS — Z0000 Encounter for general adult medical examination without abnormal findings: Secondary | ICD-10-CM

## 2022-05-05 LAB — POCT URINALYSIS DIPSTICK
Bilirubin, UA: NEGATIVE
Glucose, UA: POSITIVE — AB
Ketones, UA: NEGATIVE
Leukocytes, UA: NEGATIVE
Nitrite, UA: NEGATIVE
Protein, UA: POSITIVE — AB
Spec Grav, UA: 1.02 (ref 1.010–1.025)
Urobilinogen, UA: 0.2 E.U./dL
pH, UA: 6 (ref 5.0–8.0)

## 2022-05-05 NOTE — Progress Notes (Signed)
Rich Brave [place]leton,acting as a Education administrator for Maximino Greenland, MD.,have documented all relevant documentation on the behalf of Maximino Greenland, MD,as directed by  Maximino Greenland, MD while in the presence of Maximino Greenland, MD.   Subjective:     Patient ID: Betty Arroyo , female    DOB: [birth year] , 76 y.o.   MRN: [id number]   Chief Complaint  Patient presents with   Annual Exam   Diabetes   Hypertension    HPI  She is here today for a full physical examination.  She reports compliance with meds. She denies headaches, chest pain and shortness of breath.    Diabetes She presents for her follow-up diabetic visit. She has type 2 diabetes mellitus. Her disease course has been stable. Pertinent negatives for diabetes include no blurred vision, no chest pain and no fatigue. There are no hypoglycemic complications. Diabetic complications include nephropathy. Risk factors for coronary artery disease include diabetes mellitus, dyslipidemia, hypertension and post-menopausal. Current diabetic treatment includes oral agent (monotherapy). Her weight is stable. She is following a diabetic diet. She participates in exercise intermittently. Her breakfast blood glucose is taken between 8-9 am. Her breakfast blood glucose range is generally 90-110 mg/dl. Her dinner blood glucose is taken between 5-6 pm. Her dinner blood glucose range is generally 130-140 mg/dl. An ACE inhibitor/angiotensin II receptor blocker is being taken. Eye exam is current.  Hypertension This is a chronic problem. The current episode started more than 1 year ago. The problem has been gradually improving since onset. The problem is controlled. Pertinent negatives include no blurred vision or chest pain. Past treatments include ACE inhibitors, diuretics and angiotensin blockers.     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   Right > left leg   Vertigo, benign paroxysmal    Wears glasses      Family History  Problem Relation Age of Onset   Diabetes Mother    Hyperlipidemia Mother    Hypertension Mother    Varicose Veins Mother    Hyperlipidemia Brother    Hypertension Brother    Past Surgical History:  Procedure Laterality Date   COLONOSCOPY  2019   HALLUX FUSION Left 02/01/2022   Procedure: HALLUX FUSION, METATARSAL PHALANGEAL JOINT;  Surgeon: Felipa Furnace, DPM;  Location: [facility];  Service: Podiatry;  Laterality: Left;  WITH BLOCK   TONSILLECTOMY  1984   TOTAL KNEE ARTHROPLASTY Left 02/16/2019   Procedure: TOTAL KNEE ARTHROPLASTY;  Surgeon: Dorna Leitz, MD;  Location: [facility];  Service: Orthopedics;  Laterality: Left;   VAGINAL HYSTERECTOMY     age 11     Current Outpatient Medications:    acetaminophen (TYLENOL) 500 MG tablet, Take 1,000 mg by mouth every 6 (six) hours as needed., Disp: , Rfl:    carvedilol (COREG) 6.25 MG tablet, TAKE 2 TABLETS BY MOUTH TWICE DAILY WITH FOOD, Disp: 180 tablet, Rfl: 1   Cholecalciferol (VITAMIN D3) 50 MCG (2000 UT) capsule, Take 2,000 Units by mouth at bedtime., Disp: , Rfl:    empagliflozin (JARDIANCE) 10 MG TABS tablet, Take 1 tablet (10 mg total) by mouth daily. (Patient taking differently: Take 10 mg by mouth at bedtime.), Disp: 90  tablet, Rfl: 2   ezetimibe (ZETIA) 10 MG tablet, Take 1 tablet (10 mg total) by mouth daily., Disp: 90 tablet, Rfl: 0   gabapentin (NEURONTIN) 100 MG capsule, Take 1 capsule (100 mg total) by mouth 3 (three) times daily., Disp: 90 capsule, Rfl: 3   ibuprofen (ADVIL) 800 MG tablet, Take 1 tablet (800 mg total) by mouth every 6 (six) hours as needed., Disp: 60 tablet, Rfl: 1   meclizine (ANTIVERT) 25 MG tablet, Take 1 tablet (25 mg total) by mouth 3 (three) times daily as needed for  dizziness. (Patient taking differently: Take 25 mg by mouth 3 (three) times daily as needed for dizziness.), Disp: 30 tablet, Rfl: 0   oxyCODONE-acetaminophen (PERCOCET) 5-325 MG tablet, Take 1 tablet by mouth every 4 (four) hours as needed for severe pain., Disp: 30 tablet, Rfl: 0   rosuvastatin (CRESTOR) 40 MG tablet, Take 1 tablet (40 mg total) by mouth daily. (Patient taking differently: Take 40 mg by mouth at bedtime.), Disp: 90 tablet, Rfl: 2   Semaglutide,0.25 or 0.5MG/DOS, 2 MG/3ML SOPN, Inject 0.25 mg into the skin once a week. (Patient taking differently: Inject 0.25 mg into the skin once a week. Monday's), Disp: 6 mL, Rfl: 2   triamcinolone cream (KENALOG) 0.1 %, Apply 1 application topically 2 (two) times daily. (Patient taking differently: Apply 1 application  topically 2 (two) times daily as needed.), Disp: 30 g, Rfl: 3   vitamin C (ASCORBIC ACID) 500 MG tablet, Take 500 mg by mouth at bedtime., Disp: , Rfl:    Allergies  Allergen Reactions   Farxiga [Dapagliflozin] Rash      The patient states she uses post menopausal status for birth control. Last LMP was No LMP recorded. Patient has had a hysterectomy.. Negative for Dysmenorrhea. Negative for: breast discharge, breast lump(s), breast pain and breast self exam. Associated symptoms include abnormal vaginal bleeding. Pertinent negatives include abnormal bleeding (hematology), anxiety, decreased libido, depression, difficulty falling sleep, dyspareunia, history of infertility, nocturia, sexual dysfunction, sleep disturbances, urinary incontinence, urinary urgency, vaginal discharge and vaginal itching. Diet regular.The patient states her exercise level is  intermittent.  . The patient's tobacco use is:  Social History   Tobacco Use  Smoking Status Never  Smokeless Tobacco Never  . She has been exposed to passive smoke. The patient's alcohol use is:  Social History   Substance and Sexual Activity  Alcohol Use No   Review of  Systems  Constitutional: Negative.  Negative for fatigue.  HENT:  Positive for congestion.   Eyes: Negative.  Negative for blurred vision.  Respiratory:  Positive for cough.        She c/o congestion and coughing. She feels like she has a lot of phlegm in her throat.    Cardiovascular: Negative.  Negative for chest pain.  Gastrointestinal: Negative.   Endocrine: Negative.   Genitourinary: Negative.   Musculoskeletal: Negative.   Skin: Negative.   Allergic/Immunologic: Negative.   Neurological: Negative.   Hematological: Negative.   Psychiatric/Behavioral: Negative.       Today's Vitals   05/05/22 1410 05/05/22 1443  BP: (!) 170/90 (!) 150/82  Temp: 97.9 F (36.6 C)   Weight: 148 lb 6.4 oz (67.3 kg)   Height: _0  (1.549 m)   PainSc: 0-No pain    Body mass index is 28.04 kg/m.  Wt Readings from Last 3 Encounters:  05/05/22 148 lb 6.4 oz (67.3 kg)  04/01/22 151 lb 3.2 oz (68.6 kg)  02/01/22 149 lb  11.2 oz (67.9 kg)     Objective:  Physical Exam Vitals and nursing note reviewed.  Constitutional:      Appearance: Normal appearance.  HENT:     Head: Normocephalic and atraumatic.     Right Ear: Tympanic membrane, ear canal and external ear normal.     Left Ear: Tympanic membrane, ear canal and external ear normal.     Nose:     Comments: Masked     Mouth/Throat:     Comments: Masked  Eyes:     Extraocular Movements: Extraocular movements intact.     Conjunctiva/sclera: Conjunctivae normal.     Pupils: Pupils are equal, round, and reactive to light.  Cardiovascular:     Rate and Rhythm: Normal rate and regular rhythm.     Pulses: Normal pulses.          Dorsalis pedis pulses are 2+ on the right side and 2+ on the left side.     Heart sounds: Normal heart sounds.  Pulmonary:     Effort: Pulmonary effort is normal.     Breath sounds: Normal breath sounds.  Chest:  Breasts:    Tanner Score is 5.     Right: Normal.     Left: Normal.  Abdominal:     General:  Abdomen is flat. Bowel sounds are normal.     Palpations: Abdomen is soft.  Genitourinary:    Comments: deferred Musculoskeletal:        General: Normal range of motion.     Cervical back: Normal range of motion and neck supple.  Feet:     Right foot:     Protective Sensation: 5 sites tested.  5 sites sensed.     Skin integrity: Skin integrity normal.     Toenail Condition: Right toenails are normal.     Left foot:     Protective Sensation: 5 sites tested.  5 sites sensed.     Skin integrity: Skin integrity normal.     Toenail Condition: Left toenails are normal.     Comments: Left great toe swelling, healing surgical scar, no overlying erythema Skin:    General: Skin is warm and dry.  Neurological:     General: No focal deficit present.     Mental Status: She is alert and oriented to person, place, and time.  Psychiatric:        Mood and Affect: Mood normal.        Behavior: Behavior normal.      Assessment And Plan:     1. Annual physical exam Comments: A full exam was performed. Importance of monthly self breast exams was discussed with the patient.  PATIENT IS ADVISED TO GET 30-45 MINUTES REGULAR EXERCISE NO LESS THAN FOUR TO FIVE DAYS PER WEEK - BOTH WEIGHTBEARING EXERCISES AND AEROBIC ARE RECOMMENDED.  PATIENT IS ADVISED TO FOLLOW A HEALTHY DIET WITH AT LEAST SIX FRUITS/VEGGIES PER DAY, DECREASE INTAKE OF RED MEAT, AND TO INCREASE FISH INTAKE TO TWO DAYS PER WEEK.  MEATS/FISH SHOULD NOT BE FRIED, BAKED OR BROILED IS PREFERABLE.  IT IS ALSO IMPORTANT TO CUT BACK ON YOUR SUGAR INTAKE. PLEASE AVOID ANYTHING WITH ADDED SUGAR, CORN SYRUP OR OTHER SWEETENERS. IF YOU MUST USE A SWEETENER, YOU CAN TRY STEVIA. IT IS ALSO IMPORTANT TO AVOID ARTIFICIALLY SWEETENERS AND DIET BEVERAGES. LASTLY, I SUGGEST WEARING SPF 50 SUNSCREEN ON EXPOSED PARTS AND ESPECIALLY WHEN IN THE DIRECT SUNLIGHT FOR AN EXTENDED PERIOD OF TIME.  PLEASE AVOID FAST FOOD RESTAURANTS  AND INCREASE YOUR WATER INTAKE.  2.  Type 2 diabetes mellitus with stage 3a chronic kidney disease, without long-term current use of insulin (HCC) Comments: Diabetic foot exam was performed. She is encouraged to avoid NSAIDs, stay hydrated and keep BP well controlled. She will f/u in 4 months.  IT IS IMPORTANT TO MAINTAIN OPTIMAL BLOOD SUGAR CONTROL TO REDUCE YOUR RISK OF SERIOUS DIABETES COMPLICATIONS INCLUDING EYE AND KIDNEY DISEASE.  BLOOD SUGARS SHOULD BE LESS THAN 110 BEFORE MEALS AND LESS THAN 140 TWO HOURS AFTER MEALS.  PLEASE TEST BS THREE TIMES DAILY. BRING YOUR BLOOD SUGAR READINGS FOR REVIEW.  PLEASE MAINTAIN A HEALTHY, WELL BALANCED DIET FREE OF PROCESSED FOODS AND REFINED SUGARS.  MAINTAIN YOUR REGULAR EXERCISE REGIMEN.  PLS NOTIFY OFFICE FOR UNCONTROLLED NAUSEA, VOMITING, DIARRHEA, POOR ORAL INTAKE AND FOR CONSISTENT BS READINGS ABOVE 350.  - POCT Urinalysis Dipstick (81002) - Microalbumin / Creatinine Urine Ratio - CBC - CMP14+EGFR - Hemoglobin A1c  3. Parenchymal renal hypertension, stage 1 through stage 4 or unspecified chronic kidney disease Comments: Chronic, uncontrolled. No med changes today. She is reminded to follow low sodium diet, and I stressed importance of dietary/medication/exercise compliance. - CMP14+EGFR  4. Primary osteoarthritis of right hip Comments: Chronic, as per Ortho  5. Pure hypercholesterolemia Comments: Chronic, LDL goal <70. She is currently on rosuvastatin 59m daily.  6. Localized swelling of toe of left foot Comments: She is s/p bunionectomy. Advised to apply Voltaren gel to affected area twice daily prn.  7. BMI 28.0-28.9,adult Comments: She is encouraged to aim for at least 150 minutes of exercise per week.  8. Need for Tdap vaccination Comments: She was given Tdap, this will be billed via [employer]. - Tdap vaccine greater than or equal to 7yo IM  9. Need for influenza vaccination - Flu Vaccine QUAD High Dose(Fluad)  Patient was given opportunity to ask questions. Patient  verbalized understanding of the plan and was able to repeat key elements of the plan. All questions were answered to their satisfaction.   I, RMaximino Greenland MD, have reviewed all documentation for this visit. The documentation on 05/05/22 for the exam, diagnosis, procedures, and orders are all accurate and complete.   THE PATIENT IS ENCOURAGED TO PRACTICE SOCIAL DISTANCING DUE TO THE COVID-19 PANDEMIC.

## 2022-05-05 NOTE — Patient Instructions (Addendum)
Voltaren gel - apply to affected area on foot twice daily as needed  Health Maintenance, Female Adopting a healthy lifestyle and getting preventive care are important in promoting health and wellness. Ask your health care provider about: The right schedule for you to have regular tests and exams. Things you can do on your own to prevent diseases and keep yourself healthy. What should I know about diet, weight, and exercise? Eat a healthy diet  Eat a diet that includes plenty of vegetables, fruits, low-fat dairy products, and lean protein. Do not eat a lot of foods that are high in solid fats, added sugars, or sodium. Maintain a healthy weight Body mass index (BMI) is used to identify weight problems. It estimates body fat based on height and weight. Your health care provider can help determine your BMI and help you achieve or maintain a healthy weight. Get regular exercise Get regular exercise. This is one of the most important things you can do for your health. Most adults should: Exercise for at least 150 minutes each week. The exercise should increase your heart rate and make you sweat (moderate-intensity exercise). Do strengthening exercises at least twice a week. This is in addition to the moderate-intensity exercise. Spend less time sitting. Even light physical activity can be beneficial. Watch cholesterol and blood lipids Have your blood tested for lipids and cholesterol at 74 years of age, then have this test every 5 years. Have your cholesterol levels checked more often if: Your lipid or cholesterol levels are high. You are older than 74 years of age. You are at high risk for heart disease. What should I know about cancer screening? Depending on your health history and family history, you may need to have cancer screening at various ages. This may include screening for: Breast cancer. Cervical cancer. Colorectal cancer. Skin cancer. Lung cancer. What should I know about heart  disease, diabetes, and high blood pressure? Blood pressure and heart disease High blood pressure causes heart disease and increases the risk of stroke. This is more likely to develop in people who have high blood pressure readings or are overweight. Have your blood pressure checked: Every 3-5 years if you are 24-71 years of age. Every year if you are 93 years old or older. Diabetes Have regular diabetes screenings. This checks your fasting blood sugar level. Have the screening done: Once every three years after age 20 if you are at a normal weight and have a low risk for diabetes. More often and at a younger age if you are overweight or have a high risk for diabetes. What should I know about preventing infection? Hepatitis B If you have a higher risk for hepatitis B, you should be screened for this virus. Talk with your health care provider to find out if you are at risk for hepatitis B infection. Hepatitis C Testing is recommended for: Everyone born from [birth year] through 1963. Anyone with known risk factors for hepatitis C. Sexually transmitted infections (STIs) Get screened for STIs, including gonorrhea and chlamydia, if: You are sexually active and are younger than 74 years of age. You are older than 74 years of age and your health care provider tells you that you are at risk for this type of infection. Your sexual activity has changed since you were last screened, and you are at increased risk for chlamydia or gonorrhea. Ask your health care provider if you are at risk. Ask your health care provider about whether you are at high risk for  HIV. Your health care provider may recommend a prescription medicine to help prevent HIV infection. If you choose to take medicine to prevent HIV, you should first get tested for HIV. You should then be tested every 3 months for as long as you are taking the medicine. Pregnancy If you are about to stop having your period (premenopausal) and you may become  pregnant, seek counseling before you get pregnant. Take 400 to 800 micrograms (mcg) of folic acid every day if you become pregnant. Ask for birth control (contraception) if you want to prevent pregnancy. Osteoporosis and menopause Osteoporosis is a disease in which the bones lose minerals and strength with aging. This can result in bone fractures. If you are 104 years old or older, or if you are at risk for osteoporosis and fractures, ask your health care provider if you should: Be screened for bone loss. Take a calcium or vitamin D supplement to lower your risk of fractures. Be given hormone replacement therapy (HRT) to treat symptoms of menopause. Follow these instructions at home: Alcohol use Do not drink alcohol if: Your health care provider tells you not to drink. You are pregnant, may be pregnant, or are planning to become pregnant. If you drink alcohol: Limit how much you have to: 0-1 drink a day. Know how much alcohol is in your drink. In the [place] one drink equals one 12 oz bottle of beer (355 mL), one 5 oz glass of wine (148 mL), or one 1 oz glass of hard liquor (44 mL). Lifestyle Do not use any products that contain nicotine or tobacco. These products include cigarettes, chewing tobacco, and vaping devices, such as e-cigarettes. If you need help quitting, ask your health care provider. Do not use street drugs. Do not share needles. Ask your health care provider for help if you need support or information about quitting drugs. General instructions Schedule regular health, dental, and eye exams. Stay current with your vaccines. Tell your health care provider if: You often feel depressed. You have ever been abused or do not feel safe at home. Summary Adopting a healthy lifestyle and getting preventive care are important in promoting health and wellness. Follow your health care provider's instructions about healthy diet, exercising, and getting tested or screened for  diseases. Follow your health care provider's instructions on monitoring your cholesterol and blood pressure. This information is not intended to replace advice given to you by your health care provider. Make sure you discuss any questions you have with your health care provider. Document Revised: 10/27/2020 Document Reviewed: 10/27/2020 Elsevier Patient Education  [place].

## 2022-05-06 LAB — CMP14+EGFR
ALT: 9 IU/L (ref 0–32)
AST: 16 IU/L (ref 0–40)
Albumin/Globulin Ratio: 1.6 (ref 1.2–2.2)
Albumin: 4.4 g/dL (ref 3.8–4.8)
Alkaline Phosphatase: 102 IU/L (ref 44–121)
BUN/Creatinine Ratio: 16 (ref 12–28)
BUN: 14 mg/dL (ref 8–27)
Bilirubin Total: 0.6 mg/dL (ref 0.0–1.2)
CO2: 25 mmol/L (ref 20–29)
Calcium: 9.6 mg/dL (ref 8.7–10.3)
Chloride: 104 mmol/L (ref 96–106)
Creatinine, Ser: 0.88 mg/dL (ref 0.57–1.00)
Globulin, Total: 2.7 g/dL (ref 1.5–4.5)
Glucose: 75 mg/dL (ref 70–99)
Potassium: 3.6 mmol/L (ref 3.5–5.2)
Sodium: 141 mmol/L (ref 134–144)
Total Protein: 7.1 g/dL (ref 6.0–8.5)
eGFR: 69 mL/min/{1.73_m2} (ref >59)

## 2022-05-06 LAB — CBC
Hematocrit: 46.4 % (ref 34.0–46.6)
Hemoglobin: 15.4 g/dL (ref 11.1–15.9)
MCH: 29.2 pg (ref 26.6–33.0)
MCHC: 33.2 g/dL (ref 31.5–35.7)
MCV: 88 fL (ref 79–97)
Platelets: 218 10*3/uL (ref 150–450)
RBC: 5.27 x10E6/uL (ref 3.77–5.28)
RDW: 14 % (ref 11.7–15.4)
WBC: 4 10*3/uL (ref 3.4–10.8)

## 2022-05-06 LAB — HEMOGLOBIN A1C
Est. average glucose Bld gHb Est-mCnc: 134 mg/dL
Hgb A1c MFr Bld: 6.3 % — ABNORMAL HIGH (ref 4.8–5.6)

## 2022-05-06 LAB — MICROALBUMIN / CREATININE URINE RATIO
Creatinine, Urine: 65.8 mg/dL
Microalb/Creat Ratio: 289 mg/g creat — ABNORMAL HIGH (ref 0–29)
Microalbumin, Urine: 190.4 ug/mL

## 2022-05-09 DIAGNOSIS — R2242 Localized swelling, mass and lump, left lower limb: Secondary | ICD-10-CM | POA: Insufficient documentation

## 2022-05-10 ENCOUNTER — Other Ambulatory Visit: Payer: Self-pay

## 2022-05-10 DIAGNOSIS — E1122 Type 2 diabetes mellitus with diabetic chronic kidney disease: Secondary | ICD-10-CM

## 2022-05-10 MED ORDER — SEMAGLUTIDE(0.25 OR 0.5MG/DOS) 2 MG/3ML ~~LOC~~ SOPN: 0.2500 mg | PEN_INJECTOR | SUBCUTANEOUS | 2 refills | Status: DC

## 2022-06-24 DIAGNOSIS — H40033 Anatomical narrow angle, bilateral: Secondary | ICD-10-CM | POA: Diagnosis not present

## 2022-06-24 DIAGNOSIS — E119 Type 2 diabetes mellitus without complications: Secondary | ICD-10-CM | POA: Diagnosis not present

## 2022-06-24 LAB — HM DIABETES EYE EXAM

## 2022-07-23 ENCOUNTER — Other Ambulatory Visit: Payer: Self-pay | Admitting: Internal Medicine

## 2022-07-23 DIAGNOSIS — E7800 Pure hypercholesterolemia, unspecified: Secondary | ICD-10-CM

## 2022-07-24 ENCOUNTER — Other Ambulatory Visit: Payer: Self-pay | Admitting: Nurse Practitioner

## 2022-07-24 DIAGNOSIS — R21 Rash and other nonspecific skin eruption: Secondary | ICD-10-CM

## 2022-08-13 ENCOUNTER — Telehealth: Payer: Self-pay | Admitting: *Deleted

## 2022-08-13 ENCOUNTER — Other Ambulatory Visit: Payer: Self-pay | Admitting: Podiatry

## 2022-08-13 MED ORDER — IBUPROFEN 800 MG PO TABS: 800.0000 mg | ORAL_TABLET | Freq: Four times a day (QID) | ORAL | 1 refills | Status: DC | PRN

## 2022-08-13 NOTE — Telephone Encounter (Signed)
Patient is calling for a refill of ibuprofen-800 mg,had surgery on great toe in Aug.Please advise.

## 2022-08-24 DIAGNOSIS — M25551 Pain in right hip: Secondary | ICD-10-CM | POA: Diagnosis not present

## 2022-09-01 ENCOUNTER — Other Ambulatory Visit: Payer: Self-pay | Admitting: Internal Medicine

## 2022-09-06 ENCOUNTER — Other Ambulatory Visit: Payer: Self-pay

## 2022-09-06 ENCOUNTER — Ambulatory Visit: Payer: Medicare PPO | Admitting: Internal Medicine

## 2022-09-06 ENCOUNTER — Encounter: Payer: Self-pay | Admitting: Internal Medicine

## 2022-09-06 VITALS — BP 128/70 | HR 83 | Temp 97.8°F | Ht 61.0 in | Wt 146.4 lb

## 2022-09-06 DIAGNOSIS — E1122 Type 2 diabetes mellitus with diabetic chronic kidney disease: Secondary | ICD-10-CM | POA: Diagnosis not present

## 2022-09-06 DIAGNOSIS — I129 Hypertensive chronic kidney disease with stage 1 through stage 4 chronic kidney disease, or unspecified chronic kidney disease: Secondary | ICD-10-CM | POA: Diagnosis not present

## 2022-09-06 DIAGNOSIS — Z6827 Body mass index (BMI) 27.0-27.9, adult: Secondary | ICD-10-CM

## 2022-09-06 DIAGNOSIS — N1831 Chronic kidney disease, stage 3a: Secondary | ICD-10-CM

## 2022-09-06 DIAGNOSIS — E1100 Type 2 diabetes mellitus with hyperosmolarity without nonketotic hyperglycemic-hyperosmolar coma (NKHHC): Secondary | ICD-10-CM

## 2022-09-06 DIAGNOSIS — R634 Abnormal weight loss: Secondary | ICD-10-CM | POA: Diagnosis not present

## 2022-09-06 DIAGNOSIS — E7800 Pure hypercholesterolemia, unspecified: Secondary | ICD-10-CM | POA: Diagnosis not present

## 2022-09-06 MED ORDER — ACCU-CHEK MULTICLIX LANCETS MISC: 12 refills | Status: DC

## 2022-09-06 MED ORDER — ACCU-CHEK GUIDE ME W/DEVICE KIT: 1.0000 | PACK | 2 refills | Status: DC

## 2022-09-06 MED ORDER — ACCU-CHEK GUIDE ME W/DEVICE KIT: PACK | 2 refills | Status: AC

## 2022-09-06 MED ORDER — ACCU-CHEK MULTICLIX LANCETS MISC: 12 refills | Status: AC

## 2022-09-06 NOTE — Patient Instructions (Signed)

## 2022-09-06 NOTE — Progress Notes (Signed)
Barnet Glasgow Martin,acting as a Education administrator for Maximino Greenland, MD.,have documented all relevant documentation on the behalf of Maximino Greenland, MD,as directed by  Maximino Greenland, MD while in the presence of Maximino Greenland, MD.    Subjective:     Patient ID: Betty Arroyo , female    DOB: 1946-02-02 , 77 y.o.   MRN: [id number]   Chief Complaint  Patient presents with   Diabetes   Hypertension   Hyperlipidemia    HPI  Patient presents today for diabetes & bp check. Patient reports compliance with her meds. Patient does not have any questions or concerns at this time.  BP Readings from Last 3 Encounters: 09/06/22 : 128/70 05/05/22 : (!) 150/82 04/01/22 : 124/60    Diabetes She presents for her follow-up diabetic visit. She has type 2 diabetes mellitus. Her disease course has been stable. Pertinent negatives for diabetes include no blurred vision, no fatigue, no polydipsia, no polyphagia and no polyuria. There are no hypoglycemic complications. Diabetic complications include nephropathy. Risk factors for coronary artery disease include diabetes mellitus, dyslipidemia, hypertension and post-menopausal. Current diabetic treatment includes oral agent (monotherapy). Her weight is stable. She is following a diabetic diet. She participates in exercise intermittently. Her breakfast blood glucose is taken between 8-9 am. Her breakfast blood glucose range is generally 90-110 mg/dl. Her dinner blood glucose is taken between 5-6 pm. Her dinner blood glucose range is generally 130-140 mg/dl. An ACE inhibitor/angiotensin II receptor blocker is being taken. Eye exam is current.  Hypertension This is a chronic problem. The current episode started more than 1 year ago. The problem has been gradually improving since onset. The problem is controlled. Pertinent negatives include no blurred vision. Past treatments include ACE inhibitors, diuretics and angiotensin blockers.     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   Right > left leg   Vertigo, benign paroxysmal    Wears glasses      Family History  Problem Relation Age of Onset   Diabetes Mother    Hyperlipidemia Mother    Hypertension Mother    Varicose Veins Mother    Hyperlipidemia Brother    Hypertension Brother      Current Outpatient Medications:    acetaminophen (TYLENOL) 500 MG tablet, Take 1,000 mg by mouth every 6 (six) hours as needed., Disp: , Rfl:    Blood Glucose Monitoring Suppl (ACCU-CHEK GUIDE ME) w/Device KIT, 1 Box by Does not apply route as directed., Disp: 1 kit, Rfl: 2   carvedilol (COREG) 6.25 MG tablet, TAKE 2 TABLETS BY MOUTH TWICE DAILY WITH FOOD, Disp: 180 tablet, Rfl: 1   Cholecalciferol (VITAMIN D3) 50 MCG (2000 UT) capsule, Take 2,000 Units by mouth at bedtime., Disp: , Rfl:    empagliflozin (JARDIANCE) 10 MG TABS tablet, Take 1 tablet (10 mg total) by mouth daily. (Patient taking differently: Take 10 mg by mouth at bedtime.), Disp: 90 tablet, Rfl: 2   ezetimibe (ZETIA) 10 MG tablet, Take 1 tablet by mouth once daily, Disp: 90 tablet, Rfl: 0   gabapentin (NEURONTIN) 100 MG capsule, Take 1 capsule (100 mg total) by mouth 3 (three) times daily., Disp: 90 capsule, Rfl: 3   ibuprofen (  ADVIL) 800 MG tablet, Take 1 tablet (800 mg total) by mouth every 6 (six) hours as needed., Disp: 60 tablet, Rfl: 1   ibuprofen (ADVIL) 800 MG tablet, Take 1 tablet (800 mg total) by mouth every 6 (six) hours as needed., Disp: 60 tablet, Rfl: 1   Lancets (ACCU-CHEK MULTICLIX) lancets, Use as instructed, Disp: 100 each, Rfl: 12   meclizine (ANTIVERT) 25 MG tablet, Take 1 tablet (25 mg total) by mouth 3 (three) times daily as needed for dizziness. (Patient taking differently: Take 25 mg by  mouth 3 (three) times daily as needed for dizziness.), Disp: 30 tablet, Rfl: 0   oxyCODONE-acetaminophen (PERCOCET) 5-325 MG tablet, Take 1 tablet by mouth every 4 (four) hours as needed for severe pain., Disp: 30 tablet, Rfl: 0   rosuvastatin (CRESTOR) 40 MG tablet, Take 1 tablet by mouth once daily, Disp: 90 tablet, Rfl: 0   Semaglutide,0.25 or 0.5MG /DOS, 2 MG/3ML SOPN, Inject 0.25 mg into the skin once a week., Disp: 6 mL, Rfl: 2   triamcinolone cream (KENALOG) 0.1 %, APPLY  CREAM EXTERNALLY TO AFFECTED AREA TWICE DAILY, Disp: 30 g, Rfl: 0   vitamin C (ASCORBIC ACID) 500 MG tablet, Take 500 mg by mouth at bedtime., Disp: , Rfl:    Allergies  Allergen Reactions   Farxiga [Dapagliflozin] Rash     Review of Systems  Constitutional:  Positive for unexpected weight change. Negative for fatigue.       She states her family is concerned about her weight loss. Ozempic has decreased her appetite significantly. She has cut back to every other week dosing. She feels well. Denies palpitations, diarrhea, night sweats and worsening fatigue. etc. She has a new wardrobe due to her smaller size.   Eyes:  Negative for blurred vision.  Respiratory: Negative.    Cardiovascular: Negative.   Gastrointestinal: Negative.   Endocrine: Negative for polydipsia, polyphagia and polyuria.  Musculoskeletal: Negative.   Neurological: Negative.   Psychiatric/Behavioral: Negative.       Today's Vitals   09/06/22 1038  BP: 128/70  Pulse: 83  Temp: 97.8 F (36.6 C)  TempSrc: Oral  Weight: 146 lb 6.4 oz (66.4 kg)  Height: 5\' 1"  (1.549 m)  PainSc: 0-No pain   Body mass index is 27.66 kg/m.  Wt Readings from Last 3 Encounters:  09/06/22 146 lb 6.4 oz (66.4 kg)  05/05/22 148 lb 6.4 oz (67.3 kg)  04/01/22 151 lb 3.2 oz (68.6 kg)    Objective:  Physical Exam Vitals and nursing note reviewed.  Constitutional:      Appearance: Normal appearance.  HENT:     Head: Normocephalic and atraumatic.     Nose:      Comments: Masked     Mouth/Throat:     Comments: Masked  Eyes:     Extraocular Movements: Extraocular movements intact.  Cardiovascular:     Rate and Rhythm: Normal rate and regular rhythm.     Heart sounds: Normal heart sounds.  Pulmonary:     Effort: Pulmonary effort is normal.     Breath sounds: Normal breath sounds.  Musculoskeletal:     Cervical back: Normal range of motion.  Skin:    General: Skin is warm.  Neurological:     General: No focal deficit present.     Mental Status: She is alert.  Psychiatric:        Mood and Affect: Mood normal.        Behavior: Behavior normal.  Assessment And Plan:     1. Type 2 diabetes mellitus with stage 3a chronic kidney disease, without long-term current use of insulin (HCC) Comments: Chronic, I will check labs as below. We discussed possibly discontinuing Ozempic due to continued weight loss w/ every other week dosing. - BMP8+eGFR - Hemoglobin A1c - PTH, intact and calcium - Phosphorus - Protein electrophoresis, serum  2. Parenchymal renal hypertension, stage 1 through stage 4 or unspecified chronic kidney disease Comments: Chronic, controlled. She is doing great! She will c/w carvedilol twice daily.  3. Pure hypercholesterolemia Comments: Chronic, LDL goal < 70. She will c/w rosuvastatin 40mg  daily. She is encouraged to follow a heart healthy lifestyle.  4. Weight loss Comments: Possibly due to Ozempic use, she has lost 5 lbs since October. Will consider stopping meds once labs have been reviewed.  5. Body mass index (BMI) of 27.0-27.9 in adult Comments: She has lost 5lbs since October 2023. She is currently dosing Ozempic every other week.   Patient was given opportunity to ask questions. Patient verbalized understanding of the plan and was able to repeat key elements of the plan. All questions were answered to their satisfaction.   I, Maximino Greenland, MD, have reviewed all documentation for this visit. The  documentation on 09/06/22 for the exam, diagnosis, procedures, and orders are all accurate and complete.   IF YOU HAVE BEEN REFERRED TO A SPECIALIST, IT MAY TAKE 1-2 WEEKS TO SCHEDULE/PROCESS THE REFERRAL. IF YOU HAVE NOT HEARD FROM US/SPECIALIST IN TWO WEEKS, PLEASE GIVE [place] A CALL AT [phone].   THE PATIENT IS ENCOURAGED TO PRACTICE SOCIAL DISTANCING DUE TO THE COVID-19 PANDEMIC.

## 2022-09-09 ENCOUNTER — Other Ambulatory Visit: Payer: Self-pay

## 2022-09-09 DIAGNOSIS — E1122 Type 2 diabetes mellitus with diabetic chronic kidney disease: Secondary | ICD-10-CM

## 2022-09-09 LAB — PTH, INTACT AND CALCIUM: PTH: 33 pg/mL (ref 15–65)

## 2022-09-09 LAB — PROTEIN ELECTROPHORESIS, SERUM
A/G Ratio: 1.3 (ref 0.7–1.7)
Albumin ELP: 3.9 g/dL (ref 2.9–4.4)
Alpha 1: 0.2 g/dL (ref 0.0–0.4)
Alpha 2: 0.9 g/dL (ref 0.4–1.0)
Beta: 1 g/dL (ref 0.7–1.3)
Gamma Globulin: 0.9 g/dL (ref 0.4–1.8)
Globulin, Total: 3.1 g/dL (ref 2.2–3.9)
Total Protein: 7 g/dL (ref 6.0–8.5)

## 2022-09-09 LAB — HEMOGLOBIN A1C
Est. average glucose Bld gHb Est-mCnc: 131 mg/dL
Hgb A1c MFr Bld: 6.2 % — ABNORMAL HIGH (ref 4.8–5.6)

## 2022-09-09 LAB — BMP8+EGFR
BUN/Creatinine Ratio: 15 (ref 12–28)
BUN: 15 mg/dL (ref 8–27)
CO2: 21 mmol/L (ref 20–29)
Calcium: 9.5 mg/dL (ref 8.7–10.3)
Chloride: 104 mmol/L (ref 96–106)
Creatinine, Ser: 0.97 mg/dL (ref 0.57–1.00)
Glucose: 102 mg/dL — ABNORMAL HIGH (ref 70–99)
Potassium: 3.7 mmol/L (ref 3.5–5.2)
Sodium: 142 mmol/L (ref 134–144)
eGFR: 61 mL/min/{1.73_m2} (ref >59)

## 2022-09-09 LAB — PHOSPHORUS: Phosphorus: 3.8 mg/dL (ref 3.0–4.3)

## 2022-09-09 MED ORDER — GLUCOSE BLOOD VI STRP: ORAL_STRIP | 2 refills | Status: DC

## 2022-09-10 ENCOUNTER — Encounter: Payer: Self-pay | Admitting: Internal Medicine

## 2022-09-10 ENCOUNTER — Other Ambulatory Visit: Payer: Self-pay

## 2022-09-10 DIAGNOSIS — N1831 Chronic kidney disease, stage 3a: Secondary | ICD-10-CM

## 2022-09-10 MED ORDER — GLUCOSE BLOOD VI STRP: ORAL_STRIP | 2 refills | Status: DC

## 2022-09-13 ENCOUNTER — Other Ambulatory Visit: Payer: Self-pay

## 2022-09-13 DIAGNOSIS — N1831 Chronic kidney disease, stage 3a: Secondary | ICD-10-CM

## 2022-09-13 MED ORDER — GLUCOSE BLOOD VI STRP: ORAL_STRIP | 2 refills | Status: AC

## 2022-10-12 ENCOUNTER — Other Ambulatory Visit: Payer: Self-pay | Admitting: Internal Medicine

## 2022-10-12 DIAGNOSIS — E1122 Type 2 diabetes mellitus with diabetic chronic kidney disease: Secondary | ICD-10-CM

## 2022-10-12 DIAGNOSIS — N1831 Chronic kidney disease, stage 3a: Secondary | ICD-10-CM

## 2022-10-13 ENCOUNTER — Other Ambulatory Visit: Payer: Self-pay | Admitting: Internal Medicine

## 2022-10-23 ENCOUNTER — Other Ambulatory Visit: Payer: Self-pay | Admitting: Internal Medicine

## 2022-10-23 DIAGNOSIS — E7800 Pure hypercholesterolemia, unspecified: Secondary | ICD-10-CM

## 2022-11-22 DIAGNOSIS — M25551 Pain in right hip: Secondary | ICD-10-CM | POA: Diagnosis not present

## 2022-12-04 ENCOUNTER — Other Ambulatory Visit: Payer: Self-pay | Admitting: Internal Medicine

## 2022-12-08 ENCOUNTER — Other Ambulatory Visit: Payer: Self-pay | Admitting: Internal Medicine

## 2022-12-17 ENCOUNTER — Ambulatory Visit: Payer: Medicare PPO | Admitting: Internal Medicine

## 2022-12-17 ENCOUNTER — Encounter: Payer: Self-pay | Admitting: Internal Medicine

## 2022-12-17 VITALS — BP 130/60 | HR 67 | Temp 98.4°F | Ht 61.0 in | Wt 141.0 lb

## 2022-12-17 DIAGNOSIS — E663 Overweight: Secondary | ICD-10-CM

## 2022-12-17 DIAGNOSIS — N1831 Chronic kidney disease, stage 3a: Secondary | ICD-10-CM

## 2022-12-17 DIAGNOSIS — M1611 Unilateral primary osteoarthritis, right hip: Secondary | ICD-10-CM

## 2022-12-17 DIAGNOSIS — I129 Hypertensive chronic kidney disease with stage 1 through stage 4 chronic kidney disease, or unspecified chronic kidney disease: Secondary | ICD-10-CM

## 2022-12-17 DIAGNOSIS — Z01818 Encounter for other preprocedural examination: Secondary | ICD-10-CM | POA: Diagnosis not present

## 2022-12-17 DIAGNOSIS — Z6826 Body mass index (BMI) 26.0-26.9, adult: Secondary | ICD-10-CM

## 2022-12-17 DIAGNOSIS — E1122 Type 2 diabetes mellitus with diabetic chronic kidney disease: Secondary | ICD-10-CM

## 2022-12-17 NOTE — Patient Instructions (Signed)

## 2022-12-17 NOTE — Progress Notes (Signed)
I,[name], CMA,acting as a Neurosurgeon for Betty Aliment, MD.,have documented all relevant documentation on the behalf of Betty Aliment, MD,as directed by  Betty Aliment, MD while in the presence of Betty Aliment, MD.  Subjective:  Patient ID: Betty Arroyo , female    DOB: 1946-01-21 , 77 y.o.   MRN: [id number]  Chief Complaint  Patient presents with   Hypertension   Diabetes   Pre-op Exam    HPI  Patient presents today for diabetes & bp check. Patient reports compliance with her meds. Patient also needs a pre op exam. She is going to have a hip replacement. This will be performed by Dr. Margarita Rana. This is tentatively scheduled for 01/27/23. She denies having any chest pain, shortness of breath and palpitations.   She has been using Ozempic every other week due to increased weight loss.    Hypertension This is a chronic problem. The current episode started more than 1 year ago. The problem has been gradually improving since onset. The problem is controlled. Pertinent negatives include no blurred vision. Past treatments include ACE inhibitors, diuretics and angiotensin blockers.  Diabetes She presents for her follow-up diabetic visit. She has type 2 diabetes mellitus. Her disease course has been stable. Pertinent negatives for diabetes include no blurred vision, no polydipsia, no polyphagia and no polyuria. There are no hypoglycemic complications. Diabetic complications include nephropathy. Risk factors for coronary artery disease include diabetes mellitus, dyslipidemia, hypertension and post-menopausal. Current diabetic treatment includes oral agent (monotherapy). Her weight is stable. She is following a diabetic diet. She participates in exercise intermittently. Her breakfast blood glucose is taken between 8-9 am. Her breakfast blood glucose range is generally 90-110 mg/dl. Her dinner blood glucose is taken between 5-6 pm. Her dinner blood glucose range is generally 130-140  mg/dl. An ACE inhibitor/angiotensin II receptor blocker is being taken. Eye exam is current.     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   Right > left leg   Vertigo, benign paroxysmal    Wears glasses      Family History  Problem Relation Age of Onset   Diabetes Mother    Hyperlipidemia Mother    Hypertension Mother    Varicose Veins Mother    Hyperlipidemia Brother    Hypertension Brother      Current Outpatient Medications:    acetaminophen (TYLENOL) 500 MG tablet, Take 1,000 mg by mouth every 6 (six) hours as needed., Disp: , Rfl:    Blood Glucose Monitoring Suppl (ACCU-CHEK GUIDE ME) w/Device KIT, Use as directed to check blood sugars daily., Disp: 1 kit, Rfl: 2   carvedilol (COREG) 6.25 MG tablet, TAKE 2 TABLETS BY MOUTH TWICE DAILY WITH FOOD, Disp: 180 tablet, Rfl: 0   cholecalciferol (VITAMIN D3) 25 MCG (1000 UNIT) tablet, Take 2,000 Units by mouth daily., Disp: , Rfl:    empagliflozin (JARDIANCE) 10 MG TABS tablet, Take 1 tablet (10 mg total) by mouth at bedtime., Disp: 90 tablet, Rfl: 2   ezetimibe (ZETIA) 10 MG tablet, Take 1 tablet by mouth once daily, Disp: 90 tablet, Rfl: 0   gabapentin (NEURONTIN) 100 MG capsule, Take 1 capsule (100 mg total) by mouth 3 (three)  times daily., Disp: 90 capsule, Rfl: 3   glucose blood test strip, USE TO CHECK BLOOD SUGAR ONCE DAILY., Disp: 100 each, Rfl: 2   Lancets (ACCU-CHEK MULTICLIX) lancets, Use as directed to check blood sugar daily., Disp: 100 each, Rfl: 12   rosuvastatin (CRESTOR) 40 MG tablet, Take 1 tablet by mouth once daily, Disp: 90 tablet, Rfl: 0   Semaglutide,0.25 or 0.5MG /DOS, 2 MG/3ML SOPN, Inject 0.25 mg into the skin once a week. (Patient taking differently: Inject 0.25 mg into the skin  once a week. Every other week), Disp: 6 mL, Rfl: 2   triamcinolone cream (KENALOG) 0.1 %, APPLY  CREAM EXTERNALLY TO AFFECTED AREA TWICE DAILY, Disp: 30 g, Rfl: 0   vitamin C (ASCORBIC ACID) 500 MG tablet, Take 500 mg by mouth at bedtime., Disp: , Rfl:    Allergies  Allergen Reactions   Farxiga [Dapagliflozin] Rash     Review of Systems  Constitutional: Negative.   Eyes: Negative.  Negative for blurred vision.  Endocrine: Negative for polydipsia, polyphagia and polyuria.  Musculoskeletal: Negative.   Skin: Negative.   Psychiatric/Behavioral: Negative.       Today's Vitals   12/17/22 1453 12/17/22 1548  BP: (!) 142/62 130/60  Pulse: 67   Temp: 98.4 F (36.9 C)   Weight: 141 lb (64 kg)   Height: 5\' 1"  (1.549 m)   PainSc: 0-No pain    Body mass index is 26.64 kg/m.  Wt Readings from Last 3 Encounters:  12/17/22 141 lb (64 kg)  09/06/22 146 lb 6.4 oz (66.4 kg)  05/05/22 148 lb 6.4 oz (67.3 kg)    BP Readings from Last 3 Encounters:  12/17/22 130/60  09/06/22 128/70  05/05/22 (!) 150/82     The 10-year ASCVD risk score (Arnett DK, et al., 2019) is: 26.1%   Values used to calculate the score:     Age: 7 years     Sex: Female     Is Non-Hispanic African American: Yes     Diabetic: Yes     Tobacco smoker: No     Systolic Blood Pressure: 130 mmHg     Is BP treated: Yes     HDL Cholesterol: 48 mg/dL     Total Cholesterol: 154 mg/dL  Objective:  Physical Exam Vitals and nursing note reviewed.  Constitutional:      Appearance: Normal appearance.  HENT:     Head: Normocephalic and atraumatic.  Eyes:     Extraocular Movements: Extraocular movements intact.  Cardiovascular:     Rate and Rhythm: Normal rate and regular rhythm.     Heart sounds: Normal heart sounds.  Pulmonary:     Effort: Pulmonary effort is normal.     Breath sounds: Normal breath sounds.  Musculoskeletal:     Cervical back: Normal range of motion.  Skin:    General: Skin is warm.   Neurological:     General: No focal deficit present.     Mental Status: She is alert.  Psychiatric:        Mood and Affect: Mood normal.        Behavior: Behavior normal.         Assessment And Plan:  1. Type 2 diabetes mellitus with stage 3a chronic kidney disease, without long-term current use of insulin (HCC) Comments: Chronic, reminded to avoid NSAIDs to decrease risk of CKD progression. Will consider increasing Jardiance once she is post-op. F/u 3 months. She will likely be advised to hold Ozempic for  1-2 weeks prior to surgery.  - Hemoglobin A1c - TSH  2. Parenchymal renal hypertension, stage 1 through stage 4 or unspecified chronic kidney disease Comments: Chhronic, fair control. Goal BP<130/80. Advised to follow low sodium diet. She will c/w carvedilol 6.25mg  twice daily. - CBC - CMP14+EGFR  3. Primary osteoarthritis of right hip Comments: She is tentatively scheduled for right hip replacement 01/27/23.  4. Overweight with body mass index (BMI) of 26 to 26.9 in adult Comments: She is encouraged to aim for at least 150 minutes of exercise/week.  5. Preop examination Comments: EKG performed, NSr w/o acute changes. Due to underlying risk factors, I will refer her to Cardiology for pre-operative cardiology clearance. - EKG 12-Lead - Ambulatory referral to Cardiology    Return in 3 months (on 03/19/2023).  Patient was given opportunity to ask questions. Patient verbalized understanding of the plan and was able to repeat key elements of the plan. All questions were answered to their satisfaction.    I, Betty Aliment, MD, have reviewed all documentation for this visit. The documentation on 12/17/22 for the exam, diagnosis, procedures, and orders are all accurate and complete.   IF YOU HAVE BEEN REFERRED TO A SPECIALIST, IT MAY TAKE 1-2 WEEKS TO SCHEDULE/PROCESS THE REFERRAL. IF YOU HAVE NOT HEARD FROM US/SPECIALIST IN TWO WEEKS, PLEASE GIVE [place] A CALL AT [phone].

## 2022-12-18 LAB — HEMOGLOBIN A1C
Est. average glucose Bld gHb Est-mCnc: 128 mg/dL
Hgb A1c MFr Bld: 6.1 % — ABNORMAL HIGH (ref 4.8–5.6)

## 2022-12-18 LAB — CMP14+EGFR
ALT: 15 IU/L (ref 0–32)
AST: 16 IU/L (ref 0–40)
Albumin: 4.1 g/dL (ref 3.8–4.8)
Alkaline Phosphatase: 103 IU/L (ref 44–121)
BUN/Creatinine Ratio: 19 (ref 12–28)
BUN: 16 mg/dL (ref 8–27)
Bilirubin Total: 1 mg/dL (ref 0.0–1.2)
CO2: 23 mmol/L (ref 20–29)
Calcium: 9.3 mg/dL (ref 8.7–10.3)
Chloride: 102 mmol/L (ref 96–106)
Creatinine, Ser: 0.85 mg/dL (ref 0.57–1.00)
Globulin, Total: 2.4 g/dL (ref 1.5–4.5)
Glucose: 123 mg/dL — ABNORMAL HIGH (ref 70–99)
Potassium: 3.5 mmol/L (ref 3.5–5.2)
Sodium: 139 mmol/L (ref 134–144)
Total Protein: 6.5 g/dL (ref 6.0–8.5)
eGFR: 71 mL/min/{1.73_m2} (ref >59)

## 2022-12-18 LAB — CBC
Hematocrit: 46 % (ref 34.0–46.6)
Hemoglobin: 15.4 g/dL (ref 11.1–15.9)
MCH: 30 pg (ref 26.6–33.0)
MCHC: 33.5 g/dL (ref 31.5–35.7)
MCV: 90 fL (ref 79–97)
Platelets: 178 10*3/uL (ref 150–450)
RBC: 5.13 x10E6/uL (ref 3.77–5.28)
RDW: 13.3 % (ref 11.7–15.4)
WBC: 4.2 10*3/uL (ref 3.4–10.8)

## 2022-12-18 LAB — TSH: TSH: 1 u[IU]/mL (ref 0.450–4.500)

## 2022-12-20 ENCOUNTER — Ambulatory Visit: Payer: Medicare PPO | Admitting: Internal Medicine

## 2022-12-30 ENCOUNTER — Encounter: Payer: Self-pay | Admitting: Internal Medicine

## 2023-01-05 ENCOUNTER — Ambulatory Visit: Payer: Medicare PPO | Admitting: Cardiology

## 2023-01-05 ENCOUNTER — Encounter: Payer: Self-pay | Admitting: Cardiology

## 2023-01-05 VITALS — BP 146/74 | HR 78 | Resp 16 | Ht 61.0 in | Wt 145.0 lb

## 2023-01-05 DIAGNOSIS — Z01810 Encounter for preprocedural cardiovascular examination: Secondary | ICD-10-CM

## 2023-01-05 DIAGNOSIS — I10 Essential (primary) hypertension: Secondary | ICD-10-CM | POA: Diagnosis not present

## 2023-01-05 NOTE — Progress Notes (Signed)
Patient referred by Dorothyann Peng, MD for Pre-op eval  Subjective:   Betty Arroyo, female    DOB: 04-12-1946, 77 y.o.   MRN: [id number]   Chief Complaint  Patient presents with   Medical Clearance    Hip surgery   New Patient (Initial Visit)     HPI  77 y.o. African American female with hypertension, hyperlipidemia, type 2 DM, CKD III  Patient is going to undergo hip surgery in the future. In spite of hip pain. She is able to walk, including climbing stairs, without any complaints of chest pain, shortness of beth etc. Blood pressure elevated today, but generally well controlled.    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   Right > left leg   Vertigo, benign paroxysmal    Wears glasses      Past Surgical History:  Procedure Laterality Date   COLONOSCOPY  2019   HALLUX FUSION Left 02/01/2022   Procedure: HALLUX FUSION, METATARSAL PHALANGEAL JOINT;  Surgeon: Candelaria Stagers, DPM;  Location: [facility];  Service: Podiatry;  Laterality: Left;  WITH BLOCK   TONSILLECTOMY  1984   TOTAL KNEE ARTHROPLASTY Left 02/16/2019   Procedure: TOTAL KNEE ARTHROPLASTY;  Surgeon: Jodi Geralds, MD;  Location: [facility];  Service: Orthopedics;  Laterality: Left;   VAGINAL HYSTERECTOMY     age 11     Social History   Tobacco Use  Smoking Status Never  Smokeless Tobacco Never    Social History   Substance and Sexual Activity  Alcohol Use No     Family History  Problem Relation Age of Onset   Diabetes Mother    Hyperlipidemia Mother    Hypertension Mother    Varicose Veins Mother    Hyperlipidemia Brother    Hypertension Brother       Current Outpatient Medications:    acetaminophen (TYLENOL) 500 MG tablet, Take 1,000 mg  by mouth every 6 (six) hours as needed., Disp: , Rfl:    Blood Glucose Monitoring Suppl (ACCU-CHEK GUIDE ME) w/Device KIT, Use as directed to check blood sugars daily., Disp: 1 kit, Rfl: 2   carvedilol (COREG) 6.25 MG tablet, TAKE 2 TABLETS BY MOUTH TWICE DAILY WITH FOOD, Disp: 180 tablet, Rfl: 0   cholecalciferol (VITAMIN D3) 25 MCG (1000 UNIT) tablet, Take 2,000 Units by mouth daily., Disp: , Rfl:    empagliflozin (JARDIANCE) 10 MG TABS tablet, Take 1 tablet (10 mg total) by mouth at bedtime., Disp: 90 tablet, Rfl: 2   ezetimibe (ZETIA) 10 MG tablet, Take 1 tablet by mouth once daily, Disp: 90 tablet, Rfl: 0   gabapentin (NEURONTIN) 100 MG capsule, Take 1 capsule (100 mg total) by mouth 3 (three) times daily., Disp: 90 capsule, Rfl: 3   glucose blood test strip, USE TO CHECK BLOOD SUGAR ONCE DAILY., Disp: 100 each, Rfl: 2   Lancets (ACCU-CHEK MULTICLIX) lancets, Use as directed to check blood sugar daily., Disp: 100 each, Rfl: 12   rosuvastatin (CRESTOR) 40 MG tablet, Take 1 tablet by mouth once daily, Disp: 90 tablet, Rfl: 0   Semaglutide,0.25 or 0.5MG /DOS, 2 MG/3ML SOPN, Inject 0.25 mg into the skin once a week. (Patient taking differently: Inject 0.25 mg  into the skin once a week. Every other week), Disp: 6 mL, Rfl: 2   triamcinolone cream (KENALOG) 0.1 %, APPLY  CREAM EXTERNALLY TO AFFECTED AREA TWICE DAILY, Disp: 30 g, Rfl: 0   vitamin C (ASCORBIC ACID) 500 MG tablet, Take 500 mg by mouth at bedtime., Disp: , Rfl:    Cardiovascular and other pertinent studies:  Reviewed external labs and tests, independently interpreted   EKG 12/17/2022: Sinus rhythm 67 bpm Normal EKG  Recent labs: 12/17/2022: Glucose 123, BUN/Cr 16/0.85. EGFR 71. Na/K 139/3.5. Rest of the CMP normal H/H 15/46. MCV 90. Platelets 178 HbA1C 6.1% TSH 1.0 normal  01/08/2022: Chol 154, TG 72, HDL 48, LDL 92   Review of Systems  Cardiovascular:  Negative for chest pain, dyspnea on exertion, leg swelling,  palpitations and syncope.  Musculoskeletal:  Positive for joint pain.         Vitals:   01/05/23 1412  BP: (!) 146/74  Pulse: 78  Resp: 16  SpO2: 97%     Body mass index is 27.4 kg/m. Filed Weights   01/05/23 1412  Weight: 145 lb (65.8 kg)     Objective:   Physical Exam Vitals and nursing note reviewed.  Constitutional:      General: She is not in acute distress. Neck:     Vascular: No JVD.  Cardiovascular:     Rate and Rhythm: Normal rate and regular rhythm.     Heart sounds: Normal heart sounds. No murmur heard. Pulmonary:     Effort: Pulmonary effort is normal.     Breath sounds: Normal breath sounds. No wheezing or rales.          Visit diagnoses:   ICD-10-CM   1. Preop cardiovascular exam  Z01.810     2. Essential hypertension  I10           Assessment & Recommendations:   77 y.o. African American female with hypertension, hyperlipidemia, type 2 DM, CKD III  Pre-op evaluation: Risk factors for CAD, but no symptoms of angina, dyspnea Normal physical exam. Normal resting EKG> Low perioperative cardiac risk for hip surgery., No further testing necessary at this time. Blood pressure slightly elevated today, but generally well controlled.  I will see her on as needed basis.   Thank you for referring the patient to [place]. Please feel free to contact with any questions.   Elder Negus, MD Pager: [phone] Office: [phone]

## 2023-01-06 ENCOUNTER — Encounter: Payer: Self-pay | Admitting: Cardiology

## 2023-01-06 DIAGNOSIS — E782 Mixed hyperlipidemia: Secondary | ICD-10-CM | POA: Insufficient documentation

## 2023-01-06 DIAGNOSIS — Z01810 Encounter for preprocedural cardiovascular examination: Secondary | ICD-10-CM | POA: Insufficient documentation

## 2023-01-06 DIAGNOSIS — I10 Essential (primary) hypertension: Secondary | ICD-10-CM | POA: Insufficient documentation

## 2023-01-10 DIAGNOSIS — Z01812 Encounter for preprocedural laboratory examination: Secondary | ICD-10-CM | POA: Diagnosis not present

## 2023-01-10 DIAGNOSIS — M25551 Pain in right hip: Secondary | ICD-10-CM | POA: Diagnosis not present

## 2023-01-10 DIAGNOSIS — M25519 Pain in unspecified shoulder: Secondary | ICD-10-CM | POA: Diagnosis not present

## 2023-01-10 DIAGNOSIS — M1611 Unilateral primary osteoarthritis, right hip: Secondary | ICD-10-CM | POA: Diagnosis not present

## 2023-01-20 DIAGNOSIS — E785 Hyperlipidemia, unspecified: Secondary | ICD-10-CM | POA: Diagnosis not present

## 2023-01-20 DIAGNOSIS — I129 Hypertensive chronic kidney disease with stage 1 through stage 4 chronic kidney disease, or unspecified chronic kidney disease: Secondary | ICD-10-CM | POA: Diagnosis not present

## 2023-01-20 DIAGNOSIS — N182 Chronic kidney disease, stage 2 (mild): Secondary | ICD-10-CM | POA: Diagnosis not present

## 2023-01-20 DIAGNOSIS — Z833 Family history of diabetes mellitus: Secondary | ICD-10-CM | POA: Diagnosis not present

## 2023-01-20 DIAGNOSIS — Z7984 Long term (current) use of oral hypoglycemic drugs: Secondary | ICD-10-CM | POA: Diagnosis not present

## 2023-01-20 DIAGNOSIS — Z8249 Family history of ischemic heart disease and other diseases of the circulatory system: Secondary | ICD-10-CM | POA: Diagnosis not present

## 2023-01-20 DIAGNOSIS — E1122 Type 2 diabetes mellitus with diabetic chronic kidney disease: Secondary | ICD-10-CM | POA: Diagnosis not present

## 2023-01-20 DIAGNOSIS — Z888 Allergy status to other drugs, medicaments and biological substances status: Secondary | ICD-10-CM | POA: Diagnosis not present

## 2023-01-20 DIAGNOSIS — M1990 Unspecified osteoarthritis, unspecified site: Secondary | ICD-10-CM | POA: Diagnosis not present

## 2023-01-22 ENCOUNTER — Other Ambulatory Visit: Payer: Self-pay | Admitting: Internal Medicine

## 2023-01-22 DIAGNOSIS — E7800 Pure hypercholesterolemia, unspecified: Secondary | ICD-10-CM

## 2023-01-24 ENCOUNTER — Other Ambulatory Visit: Payer: Self-pay | Admitting: Internal Medicine

## 2023-01-25 DIAGNOSIS — Z1231 Encounter for screening mammogram for malignant neoplasm of breast: Secondary | ICD-10-CM | POA: Diagnosis not present

## 2023-01-25 LAB — HM MAMMOGRAPHY

## 2023-01-27 DIAGNOSIS — M1611 Unilateral primary osteoarthritis, right hip: Secondary | ICD-10-CM | POA: Diagnosis not present

## 2023-01-27 HISTORY — PX: TOTAL HIP ARTHROPLASTY: SHX124

## 2023-01-28 ENCOUNTER — Encounter: Payer: Self-pay | Admitting: Internal Medicine

## 2023-02-01 DIAGNOSIS — M1611 Unilateral primary osteoarthritis, right hip: Secondary | ICD-10-CM | POA: Diagnosis not present

## 2023-02-01 DIAGNOSIS — R262 Difficulty in walking, not elsewhere classified: Secondary | ICD-10-CM | POA: Diagnosis not present

## 2023-02-01 DIAGNOSIS — M6281 Muscle weakness (generalized): Secondary | ICD-10-CM | POA: Diagnosis not present

## 2023-02-04 DIAGNOSIS — M1611 Unilateral primary osteoarthritis, right hip: Secondary | ICD-10-CM | POA: Diagnosis not present

## 2023-02-04 DIAGNOSIS — R262 Difficulty in walking, not elsewhere classified: Secondary | ICD-10-CM | POA: Diagnosis not present

## 2023-02-04 DIAGNOSIS — M6281 Muscle weakness (generalized): Secondary | ICD-10-CM | POA: Diagnosis not present

## 2023-02-06 ENCOUNTER — Other Ambulatory Visit: Payer: Self-pay | Admitting: Internal Medicine

## 2023-02-06 DIAGNOSIS — R21 Rash and other nonspecific skin eruption: Secondary | ICD-10-CM

## 2023-02-08 DIAGNOSIS — M6281 Muscle weakness (generalized): Secondary | ICD-10-CM | POA: Diagnosis not present

## 2023-02-08 DIAGNOSIS — R262 Difficulty in walking, not elsewhere classified: Secondary | ICD-10-CM | POA: Diagnosis not present

## 2023-02-08 DIAGNOSIS — M1611 Unilateral primary osteoarthritis, right hip: Secondary | ICD-10-CM | POA: Diagnosis not present

## 2023-02-10 DIAGNOSIS — R262 Difficulty in walking, not elsewhere classified: Secondary | ICD-10-CM | POA: Diagnosis not present

## 2023-02-10 DIAGNOSIS — M1611 Unilateral primary osteoarthritis, right hip: Secondary | ICD-10-CM | POA: Diagnosis not present

## 2023-02-10 DIAGNOSIS — M6281 Muscle weakness (generalized): Secondary | ICD-10-CM | POA: Diagnosis not present

## 2023-02-11 DIAGNOSIS — M1611 Unilateral primary osteoarthritis, right hip: Secondary | ICD-10-CM | POA: Diagnosis not present

## 2023-02-14 DIAGNOSIS — R262 Difficulty in walking, not elsewhere classified: Secondary | ICD-10-CM | POA: Diagnosis not present

## 2023-02-14 DIAGNOSIS — M1611 Unilateral primary osteoarthritis, right hip: Secondary | ICD-10-CM | POA: Diagnosis not present

## 2023-02-14 DIAGNOSIS — M6281 Muscle weakness (generalized): Secondary | ICD-10-CM | POA: Diagnosis not present

## 2023-02-22 DIAGNOSIS — R262 Difficulty in walking, not elsewhere classified: Secondary | ICD-10-CM | POA: Diagnosis not present

## 2023-02-22 DIAGNOSIS — M1611 Unilateral primary osteoarthritis, right hip: Secondary | ICD-10-CM | POA: Diagnosis not present

## 2023-02-22 DIAGNOSIS — M6281 Muscle weakness (generalized): Secondary | ICD-10-CM | POA: Diagnosis not present

## 2023-03-02 ENCOUNTER — Other Ambulatory Visit: Payer: Self-pay | Admitting: Internal Medicine

## 2023-03-07 ENCOUNTER — Other Ambulatory Visit: Payer: Self-pay | Admitting: Internal Medicine

## 2023-03-09 ENCOUNTER — Encounter: Payer: Self-pay | Admitting: Pharmacist

## 2023-03-17 ENCOUNTER — Encounter: Payer: Self-pay | Admitting: Internal Medicine

## 2023-03-17 ENCOUNTER — Ambulatory Visit: Payer: Medicare PPO | Admitting: Internal Medicine

## 2023-03-17 ENCOUNTER — Other Ambulatory Visit: Payer: Self-pay | Admitting: Pharmacist

## 2023-03-17 VITALS — BP 120/78 | HR 64 | Temp 98.1°F | Ht 61.0 in | Wt 139.0 lb

## 2023-03-17 DIAGNOSIS — N1831 Chronic kidney disease, stage 3a: Secondary | ICD-10-CM | POA: Diagnosis not present

## 2023-03-17 DIAGNOSIS — E7800 Pure hypercholesterolemia, unspecified: Secondary | ICD-10-CM

## 2023-03-17 DIAGNOSIS — E1122 Type 2 diabetes mellitus with diabetic chronic kidney disease: Secondary | ICD-10-CM | POA: Diagnosis not present

## 2023-03-17 DIAGNOSIS — Z23 Encounter for immunization: Secondary | ICD-10-CM | POA: Diagnosis not present

## 2023-03-17 DIAGNOSIS — I129 Hypertensive chronic kidney disease with stage 1 through stage 4 chronic kidney disease, or unspecified chronic kidney disease: Secondary | ICD-10-CM | POA: Diagnosis not present

## 2023-03-17 LAB — BMP8+EGFR
BUN/Creatinine Ratio: 14 (ref 12–28)
BUN: 13 mg/dL (ref 8–27)
CO2: 21 mmol/L (ref 20–29)
Calcium: 9.7 mg/dL (ref 8.7–10.3)
Chloride: 107 mmol/L — ABNORMAL HIGH (ref 96–106)
Creatinine, Ser: 0.95 mg/dL (ref 0.57–1.00)
Glucose: 91 mg/dL (ref 70–99)
Potassium: 3.8 mmol/L (ref 3.5–5.2)
Sodium: 145 mmol/L — ABNORMAL HIGH (ref 134–144)
eGFR: 62 mL/min/{1.73_m2} (ref >59)

## 2023-03-17 LAB — LIPID PANEL
Chol/HDL Ratio: 3.4 ratio (ref 0.0–4.4)
Cholesterol, Total: 156 mg/dL (ref 100–199)
HDL: 46 mg/dL (ref >39)
LDL Chol Calc (NIH): 95 mg/dL (ref 0–99)
Triglycerides: 77 mg/dL (ref 0–149)
VLDL Cholesterol Cal: 15 mg/dL (ref 5–40)

## 2023-03-17 LAB — HEMOGLOBIN A1C
Est. average glucose Bld gHb Est-mCnc: 123 mg/dL
Hgb A1c MFr Bld: 5.9 % — ABNORMAL HIGH (ref 4.8–5.6)

## 2023-03-17 MED ORDER — CARVEDILOL 6.25 MG PO TABS: ORAL_TABLET | ORAL | 2 refills | Status: DC

## 2023-03-17 MED ORDER — EMPAGLIFLOZIN 10 MG PO TABS: 10.0000 mg | ORAL_TABLET | Freq: Every day | ORAL | 2 refills | Status: DC

## 2023-03-17 NOTE — Assessment & Plan Note (Addendum)
Chronic, well controlled. She will continue with carvedilol 6.25mg  2 tabs twice daily. She is encouraged to follow low sodium diet. She will rto in Dec 2024 for her next physical examination.

## 2023-03-17 NOTE — Assessment & Plan Note (Signed)
Chronic, LDL goal is less than 70.  She will continue with rosuvastatin and ezetimibe.

## 2023-03-17 NOTE — Assessment & Plan Note (Signed)
Chronic, I will check labs as below. I will adjust meds as needed. I will refer her to [facility] for patient assistance. She will continue with Jardiance 10mg  and Ozempic 0.5mg  weekly. Dose recently decreased due to excessive weight loss.

## 2023-03-17 NOTE — Patient Instructions (Signed)
Cholesterol Content in Foods Cholesterol is a waxy, fat-like substance that helps to carry fat in the blood. The body needs cholesterol in small amounts, but too much cholesterol can cause damage to the arteries and heart. What foods have cholesterol?  Cholesterol is found in animal-based foods, such as meat, seafood, and dairy. Generally, low-fat dairy and lean meats have less cholesterol than full-fat dairy and fatty meats. The milligrams of cholesterol per serving (mg per serving) of common cholesterol-containing foods are listed below. Meats and other proteins Egg -- one large whole egg has 186 mg. Veal shank -- 4 oz (113 g) has 141 mg. Lean ground [place] (93% lean) -- 4 oz (113 g) has 118 mg. Fat-trimmed lamb loin -- 4 oz (113 g) has 106 mg. Lean ground beef (90% lean) -- 4 oz (113 g) has 100 mg. Lobster -- 3.5 oz (99 g) has 90 mg. Pork loin chops -- 4 oz (113 g) has 86 mg. Canned salmon -- 3.5 oz (99 g) has 83 mg. Fat-trimmed beef top loin -- 4 oz (113 g) has 78 mg. Frankfurter -- 1 frank (3.5 oz or 99 g) has 77 mg. Crab -- 3.5 oz (99 g) has 71 mg. Roasted chicken without skin, white meat -- 4 oz (113 g) has 66 mg. Light bologna -- 2 oz (57 g) has 45 mg. Deli-cut [place] -- 2 oz (57 g) has 31 mg. Canned tuna -- 3.5 oz (99 g) has 31 mg. Tomasa Blase -- 1 oz (28 g) has 29 mg. Oysters and mussels (raw) -- 3.5 oz (99 g) has 25 mg. Mackerel -- 1 oz (28 g) has 22 mg. Trout -- 1 oz (28 g) has 20 mg. Pork sausage -- 1 link (1 oz or 28 g) has 17 mg. Salmon -- 1 oz (28 g) has 16 mg. Tilapia -- 1 oz (28 g) has 14 mg. Dairy Soft-serve ice cream --  cup (4 oz or 86 g) has 103 mg. Whole-milk yogurt -- 1 cup (8 oz or 245 g) has 29 mg. Cheddar cheese -- 1 oz (28 g) has 28 mg. American cheese -- 1 oz (28 g) has 28 mg. Whole milk -- 1 cup (8 oz or 250 mL) has 23 mg. 2% milk -- 1 cup (8 oz or 250 mL) has 18 mg. Cream cheese -- 1 tablespoon (Tbsp) (14.5 g) has 15 mg. Cottage cheese --  cup (4 oz or  113 g) has 14 mg. Low-fat (1%) milk -- 1 cup (8 oz or 250 mL) has 10 mg. Sour cream -- 1 Tbsp (12 g) has 8.5 mg. Low-fat yogurt -- 1 cup (8 oz or 245 g) has 8 mg. Nonfat Greek yogurt -- 1 cup (8 oz or 228 g) has 7 mg. Half-and-half cream -- 1 Tbsp (15 mL) has 5 mg. Fats and oils Cod liver oil -- 1 tablespoon (Tbsp) (13.6 g) has 82 mg. Butter -- 1 Tbsp (14 g) has 15 mg. Lard -- 1 Tbsp (12.8 g) has 14 mg. Bacon grease -- 1 Tbsp (12.9 g) has 14 mg. Mayonnaise -- 1 Tbsp (13.8 g) has 5-10 mg. Margarine -- 1 Tbsp (14 g) has 3-10 mg. The items listed above may not be a complete list of foods with cholesterol. Exact amounts of cholesterol in these foods may vary depending on specific ingredients and brands. Contact a dietitian for more information. What foods do not have cholesterol? Most plant-based foods do not have cholesterol unless you combine them with a food that has  cholesterol. Foods without cholesterol include: Grains and cereals. Vegetables. Fruits. Vegetable oils, such as olive, canola, and sunflower oil. Legumes, such as peas, beans, and lentils. Nuts and seeds. Egg whites. The items listed above may not be a complete list of foods that do not have cholesterol. Contact a dietitian for more information. Summary The body needs cholesterol in small amounts, but too much cholesterol can cause damage to the arteries and heart. Cholesterol is found in animal-based foods, such as meat, seafood, and dairy. Generally, low-fat dairy and lean meats have less cholesterol than full-fat dairy and fatty meats. This information is not intended to replace advice given to you by your health care provider. Make sure you discuss any questions you have with your health care provider. Document Revised: 10/17/2020 Document Reviewed: 10/17/2020 Elsevier Patient Education  2024 [employer].

## 2023-03-17 NOTE — Progress Notes (Signed)
Care Coordination Call  Met with patient while in office. Completed applications for Ozempic [employer]) and Jardiance ([employer]). Patient will call back once she confirms income information.   Catie Eppie Gibson, PharmD, BCACP, CPP Clinical Pharmacist [facility] [phone]

## 2023-03-17 NOTE — Progress Notes (Signed)
0I,[name], CMA,acting as a Neurosurgeon for [name], MD.,have documented all relevant documentation on the behalf of [name], MD,as directed by  [name], MD while in the presence of [name], MD.  Subjective:  Patient ID: Betty Arroyo , female    DOB: 08/10/1945 , 77 y.o.   MRN: [id number]  Chief Complaint  Patient presents with   Diabetes   Hypertension   Hyperlipidemia    HPI  Patient presents today for diabetes & bpc. She reports compliance with medications. Denies headache, chest pain & sob. She has no specific concerns or complaints   Hypertension This is a chronic problem. The current episode started more than 1 year ago. The problem has been gradually improving since onset. The problem is controlled. Pertinent negatives include no blurred vision. Past treatments include ACE inhibitors, diuretics and angiotensin blockers.  Diabetes She presents for her follow-up diabetic visit. She has type 2 diabetes mellitus. Her disease course has been stable. Pertinent negatives for diabetes include no blurred vision, no fatigue, no polydipsia, no polyphagia and no polyuria. There are no hypoglycemic complications. Diabetic complications include nephropathy. Risk factors for coronary artery disease include diabetes mellitus, dyslipidemia, hypertension and post-menopausal. Current diabetic treatment includes oral agent (monotherapy). Her weight is stable. She is following a diabetic diet. She participates in exercise intermittently. Her breakfast blood glucose is taken between 8-9 am. Her breakfast blood glucose range is generally 90-110 mg/dl. Her dinner blood glucose is taken between 5-6 pm. Her dinner blood glucose range is generally 130-140 mg/dl. An ACE inhibitor/angiotensin II receptor blocker is being taken. Eye exam is current.     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   Vertigo, benign paroxysmal    Wears glasses      Family History  Problem Relation Age of Onset   Diabetes Mother    Hyperlipidemia Mother    Hypertension Mother    Varicose Veins Mother    Hyperlipidemia Brother    Hypertension Brother      Current Outpatient Medications:    acetaminophen (TYLENOL) 500 MG tablet, Take 1,000 mg by mouth every 6 (six) hours as needed., Disp: , Rfl:    Blood Glucose Monitoring Suppl (ACCU-CHEK GUIDE ME) w/Device KIT, Use as directed to check blood sugars daily., Disp: 1 kit, Rfl: 2   cholecalciferol (VITAMIN D3) 25 MCG (1000 UNIT) tablet, Take 2,000 Units by mouth daily., Disp: , Rfl:    ezetimibe (ZETIA) 10 MG tablet, Take 1 tablet by mouth once daily, Disp: 90 tablet, Rfl: 0   glucose blood test strip, USE TO CHECK BLOOD SUGAR ONCE DAILY., Disp: 100 each, Rfl: 2   Lancets (ACCU-CHEK MULTICLIX) lancets, Use as directed to check blood sugar daily., Disp: 100 each, Rfl: 12   rosuvastatin (CRESTOR) 40 MG tablet, Take 1 tablet by mouth once daily, Disp: 90 tablet, Rfl: 0   Semaglutide,0.25 or 0.5MG /DOS, 2 MG/3ML SOPN, Inject 0.25 mg into the skin once a week. (Patient taking differently: Inject 0.25 mg into the skin once a week. Every other week), Disp: 6 mL, Rfl: 2   triamcinolone cream (KENALOG) 0.1 %, APPLY CREAM EXTERNALLY TO AFFECTED  AREA TWICE DAILY, Disp: 30 g, Rfl: 0   vitamin C (ASCORBIC ACID) 500 MG tablet, Take 500 mg by mouth at bedtime., Disp: , Rfl:    carvedilol (COREG) 6.25 MG tablet, TAKE 2 TABLETS BY MOUTH TWICE DAILY WITH FOOD, Disp: 180 tablet, Rfl: 2   empagliflozin (JARDIANCE) 10 MG TABS tablet, Take 1 tablet (10 mg total) by mouth at bedtime., Disp: 90 tablet, Rfl: 2   gabapentin (NEURONTIN) 100 MG capsule, Take 1 capsule (100 mg total) by mouth 3 (three) times  daily. (Patient not taking: Reported on 03/17/2023), Disp: 90 capsule, Rfl: 3   meloxicam (MOBIC) 15 MG tablet, Take 15 mg by mouth daily. (Patient not taking: Reported on 03/17/2023), Disp: , Rfl:    Allergies  Allergen Reactions   Farxiga [Dapagliflozin] Rash     Review of Systems  Constitutional: Negative.  Negative for fatigue.  Eyes:  Negative for blurred vision.  Respiratory: Negative.    Cardiovascular: Negative.   Gastrointestinal: Negative.   Endocrine: Negative for polydipsia, polyphagia and polyuria.  Neurological: Negative.   Psychiatric/Behavioral: Negative.       Today's Vitals   03/17/23 0903  BP: 120/78  Pulse: 64  Temp: 98.1 F (36.7 C)  Weight: 139 lb (63 kg)  Height: 5\' 1"  (1.549 m)  PainSc: 0-No pain   Body mass index is 26.26 kg/m.  Wt Readings from Last 3 Encounters:  03/17/23 139 lb (63 kg)  01/05/23 145 lb (65.8 kg)  12/17/22 141 lb (64 kg)     Objective:  Physical Exam Vitals and nursing note reviewed.  Constitutional:      Appearance: Normal appearance.  HENT:     Head: Normocephalic and atraumatic.  Eyes:     Extraocular Movements: Extraocular movements intact.  Cardiovascular:     Rate and Rhythm: Normal rate and regular rhythm.     Heart sounds: Normal heart sounds.  Pulmonary:     Effort: Pulmonary effort is normal.     Breath sounds: Normal breath sounds.  Musculoskeletal:     Cervical back: Normal range of motion.  Skin:    General: Skin is warm.  Neurological:     General: No focal deficit present.     Mental Status: She is alert.  Psychiatric:        Mood and Affect: Mood normal.        Behavior: Behavior normal.         Assessment And Plan:  Type 2 diabetes mellitus with stage 3a chronic kidney disease, without long-term current use of insulin (HCC) Assessment & Plan: Chronic, I will check labs as below. I will adjust meds as needed. I will refer her to [facility] for patient assistance. She will continue with  Jardiance 10mg  and Ozempic 0.5mg  weekly. Dose recently decreased due to excessive weight loss.    Orders: -     Hemoglobin A1c -     Empagliflozin; Take 1 tablet (10 mg total) by mouth at bedtime.  Dispense: 90 tablet; Refill: 2 -     BMP8+eGFR -     AMB Referral to Pharmacy Medication Management  Parenchymal renal hypertension, stage 1 through stage 4 or unspecified chronic kidney disease Assessment & Plan: Chronic, well controlled. She will continue with carvedilol 6.25mg  2 tabs twice daily. She is encouraged to follow low sodium diet. She will rto in Dec 2024 for her next physical examination.   Orders: -     Carvedilol; TAKE 2 TABLETS BY MOUTH TWICE DAILY  WITH FOOD  Dispense: 180 tablet; Refill: 2 -     BMP8+eGFR -     AMB Referral to Pharmacy Medication Management  Pure hypercholesterolemia Assessment & Plan: Chronic, LDL goal is less than 70.  She will continue with rosuvastatin and ezetimibe.   Orders: -     Lipid panel -     AMB Referral to Pharmacy Medication Management  Immunization due -     Flu Vaccine Trivalent High Dose (Fluad)     Return if symptoms worsen or fail to improve.  Patient was given opportunity to ask questions. Patient verbalized understanding of the plan and was able to repeat key elements of the plan. All questions were answered to their satisfaction.    I, [name], MD, have reviewed all documentation for this visit. The documentation on 03/17/23 for the exam, diagnosis, procedures, and orders are all accurate and complete.   IF YOU HAVE BEEN REFERRED TO A SPECIALIST, IT MAY TAKE 1-2 WEEKS TO SCHEDULE/PROCESS THE REFERRAL. IF YOU HAVE NOT HEARD FROM US/SPECIALIST IN TWO WEEKS, PLEASE GIVE [place] A CALL AT [phone] X [phone].   THE PATIENT IS ENCOURAGED TO PRACTICE SOCIAL DISTANCING DUE TO THE COVID-19 PANDEMIC.

## 2023-04-20 ENCOUNTER — Ambulatory Visit: Payer: Self-pay | Admitting: Internal Medicine

## 2023-04-20 ENCOUNTER — Ambulatory Visit: Payer: Medicare PPO

## 2023-04-20 VITALS — BP 126/70 | HR 68 | Temp 98.0°F | Ht 61.0 in | Wt 138.0 lb

## 2023-04-20 DIAGNOSIS — Z0000 Encounter for general adult medical examination without abnormal findings: Secondary | ICD-10-CM

## 2023-04-20 NOTE — Progress Notes (Signed)
Subjective:   Betty Arroyo is a 77 y.o. female who presents for Medicare Annual (Subsequent) preventive examination.  Visit Complete: In person    Cardiac Risk Factors include: advanced age (>75men, >55 women);diabetes mellitus;hypertension     Objective:    Today's Vitals   04/20/23 1145 04/20/23 1200  BP: 136/70 126/70  Pulse: 68   Temp: 98 F (36.7 C)   TempSrc: Oral   Weight: 138 lb (62.6 kg)   Height: 5\' 1"  (1.549 m)    Body mass index is 26.07 kg/m.     04/20/2023   11:53 AM 04/01/2022    9:24 AM 02/01/2022    5:55 AM 03/05/2021    9:36 AM 12/12/2019    9:49 AM 02/16/2019   11:40 AM 02/13/2019    2:00 PM  Advanced Directives  Does Patient Have a Medical Advance Directive? Yes Yes Yes Yes Yes Yes Yes  Type of Estate agent of [place];Living will Healthcare Power of [place];Living will Healthcare Power of [place];Living will Healthcare Power of [place];Living will Healthcare Power of [place];Living will Healthcare Power of [place];Living will   Does patient want to make changes to medical advance directive?      No - Patient declined   Copy of Healthcare Power of Attorney in Chart? No - copy requested No - copy requested No - copy requested No - copy requested No - copy requested No - copy requested     Current Medications (verified) Outpatient Encounter Medications as of 04/20/2023  Medication Sig   acetaminophen (TYLENOL) 500 MG tablet Take 1,000 mg by mouth every 6 (six) hours as needed.   Blood Glucose Monitoring Suppl (ACCU-CHEK GUIDE ME) w/Device KIT Use as directed to check blood sugars daily.   carvedilol (COREG) 6.25 MG tablet TAKE 2 TABLETS BY MOUTH TWICE DAILY WITH FOOD   cholecalciferol (VITAMIN D3) 25 MCG (1000 UNIT) tablet Take 2,000 Units by mouth daily.   empagliflozin (JARDIANCE) 10 MG TABS tablet Take 1 tablet (10 mg total) by mouth at bedtime.   ezetimibe (ZETIA) 10 MG tablet Take 1 tablet by mouth once daily    glucose blood test strip USE TO CHECK BLOOD SUGAR ONCE DAILY.   Lancets (ACCU-CHEK MULTICLIX) lancets Use as directed to check blood sugar daily.   rosuvastatin (CRESTOR) 40 MG tablet Take 1 tablet by mouth once daily   Semaglutide,0.25 or 0.5MG /DOS, 2 MG/3ML SOPN Inject 0.25 mg into the skin once a week.   triamcinolone cream (KENALOG) 0.1 % APPLY CREAM EXTERNALLY TO AFFECTED AREA TWICE DAILY   vitamin C (ASCORBIC ACID) 500 MG tablet Take 500 mg by mouth at bedtime.   gabapentin (NEURONTIN) 100 MG capsule Take 1 capsule (100 mg total) by mouth 3 (three) times daily. (Patient not taking: Reported on 03/17/2023)   meloxicam (MOBIC) 15 MG tablet Take 15 mg by mouth daily. (Patient not taking: Reported on 03/17/2023)   No facility-administered encounter medications on file as of 04/20/2023.    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   Right > left leg   Vertigo, benign paroxysmal    Wears glasses    Past Surgical History:  Procedure Laterality Date  COLONOSCOPY  2019   HALLUX FUSION Left 02/01/2022   Procedure: HALLUX FUSION, METATARSAL PHALANGEAL JOINT;  Surgeon: Candelaria Stagers, DPM;  Location: [facility];  Service: Podiatry;  Laterality: Left;  WITH BLOCK   TONSILLECTOMY  1984   TOTAL HIP ARTHROPLASTY 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   TOTAL KNEE ARTHROPLASTY Left 02/16/2019   Procedure: TOTAL KNEE ARTHROPLASTY;  Surgeon: Jodi Geralds, MD;  Location: [facility];  Service: Orthopedics;  Laterality: Left;   VAGINAL HYSTERECTOMY     age 74   Family History  Problem Relation Age of Onset   Diabetes Mother    Hyperlipidemia Mother    Hypertension Mother    Varicose Veins Mother    Hyperlipidemia  Brother    Hypertension Brother    Social History   Socioeconomic History   Marital status: Married    Spouse name: Not on file   Number of children: 2   Years of education: Not on file   Highest education level: Not on file  Occupational History   Occupation: retired  Tobacco Use   Smoking status: Never   Smokeless tobacco: Never  Vaping Use   Vaping status: Never Used  Substance and Sexual Activity   Alcohol use: No   Drug use: Never   Sexual activity: Yes    Birth control/protection: Post-menopausal  Other Topics Concern   Not on file  Social History Narrative   Not on file   Social Determinants of Health   Financial Resource Strain: Low Risk  (04/20/2023)   Overall Financial Resource Strain (CARDIA)    Difficulty of Paying Living Expenses: Not hard at all  Food Insecurity: No Food Insecurity (04/20/2023)   Hunger Vital Sign    Worried About Running Out of Food in the Last Year: Never true    Ran Out of Food in the Last Year: Never true  Transportation Needs: No Transportation Needs (04/20/2023)   PRAPARE - Administrator, Civil Service (Medical): No    Lack of Transportation (Non-Medical): No  Physical Activity: Inactive (04/20/2023)   Exercise Vital Sign    Days of Exercise per Week: 0 days    Minutes of Exercise per Session: 0 min  Stress: No Stress Concern Present (04/20/2023)   [employer] of Occupational Health - Occupational Stress Questionnaire    Feeling of Stress : Not at all  Social Connections: Moderately Integrated (04/20/2023)   Social Connection and Isolation Panel [NHANES]    Frequency of Communication with Friends and Family: More than three times a week    Frequency of Social Gatherings with Friends and Family: Three times a week    Attends Religious Services: More than 4 times per year    Active Member of Clubs or Organizations: No    Attends Banker Meetings: Never    Marital Status: Married    Tobacco  Counseling Counseling given: Not Answered   Clinical Intake:  Pre-visit preparation completed: Yes  Pain : No/denies pain     Nutritional Status: BMI 25 -29 Overweight Nutritional Risks: None Diabetes: Yes CBG done?: No Did pt. bring in CBG monitor from home?: No  How often do you need to have someone help you when you read instructions, pamphlets, or other written materials from your doctor or pharmacy?: 1 - Never  Interpreter Needed?: No  Information entered by :: NAllen LPN   Activities of Daily Living    04/20/2023   11:46 AM  In your present state of health, do  you have any difficulty performing the following activities:  Hearing? 0  Vision? 0  Difficulty concentrating or making decisions? 0  Walking or climbing stairs? 0  Dressing or bathing? 0  Doing errands, shopping? 0  Preparing Food and eating ? N  Using the Toilet? N  In the past six months, have you accidently leaked urine? N  Do you have problems with loss of bowel control? N  Managing your Medications? N  Managing your Finances? N  Housekeeping or managing your Housekeeping? N    Patient Care Team: Dorothyann Peng, MD as PCP - General (Internal Medicine) Alden Hipp, RPH-CPP (Pharmacist)  Indicate any recent Medical Services you may have received from other than [facility] providers in the past year (date may be approximate).     Assessment:   This is a routine wellness examination for [place].  Hearing/Vision screen Hearing Screening - Comments:: Denies hearing issues Vision Screening - Comments:: Regular eye exams, [facility]   Goals Addressed             This Visit's Progress    Patient Stated       04/20/2023, maintain current weight       Depression Screen    04/20/2023   11:54 AM 12/17/2022    2:56 PM 04/01/2022    9:24 AM 03/05/2021    9:37 AM 12/29/2020   12:28 PM 12/12/2019    9:50 AM 08/14/2019    3:00 PM  PHQ 2/9 Scores  PHQ - 2 Score 0 0 0 0 0 0 0  PHQ- 9 Score 0 0    0      Fall Risk    04/20/2023   11:54 AM 12/17/2022    2:55 PM 04/01/2022    9:24 AM 03/05/2021    9:36 AM 12/29/2020   12:28 PM  Fall Risk   Falls in the past year? 0 0 0 0 0  Number falls in past yr: 0 0 0  0  Injury with Fall? 0 0 0  0  Risk for fall due to : Medication side effect No Fall Risks Medication side effect Medication side effect   Follow up Falls prevention discussed;Falls evaluation completed Falls evaluation completed Falls prevention discussed;Education provided;Falls evaluation completed Falls evaluation completed;Education provided;Falls prevention discussed     MEDICARE RISK AT HOME: Medicare Risk at Home Any stairs in or around the home?: Yes If so, are there any without handrails?: No Home free of loose throw rugs in walkways, pet beds, electrical cords, etc?: Yes Adequate lighting in your home to reduce risk of falls?: Yes Life alert?: No Use of a cane, walker or w/c?: No Grab bars in the bathroom?: Yes Shower chair or bench in shower?: Yes Elevated toilet seat or a handicapped toilet?: Yes  TIMED UP AND GO:  Was the test performed?  Yes  Length of time to ambulate 10 feet: 5 sec Gait steady and fast without use of assistive device    Cognitive Function:        04/20/2023   11:55 AM 04/01/2022    9:25 AM 03/05/2021    9:37 AM 12/12/2019    9:51 AM 12/06/2018   10:26 AM  6CIT Screen  What Year? 0 points 0 points 0 points 0 points 0 points  What month? 0 points 0 points 0 points 0 points 0 points  What time? 0 points 0 points 0 points 0 points 0 points  Count back from 20 0 points 0 points 0  points 2 points 0 points  Months in reverse 2 points 0 points 0 points 0 points 0 points  Repeat phrase 0 points 0 points 0 points 2 points 0 points  Total Score 2 points 0 points 0 points 4 points 0 points    Immunizations Immunization History  Administered Date(s) Administered   Fluad Quad(high Dose 65+) 04/08/2020, 03/05/2021, 05/05/2022   Fluad  Trivalent(High Dose 65+) 03/17/2023   Influenza, High Dose Seasonal PF 07/24/2018, 04/24/2019   Influenza-Unspecified 04/24/2019   PFIZER(Purple Top)SARS-COV-2 Vaccination 07/28/2019, 08/18/2019, 06/02/2020   Pneumococcal Conjugate-13 04/24/2019   Pneumococcal Polysaccharide-23 10/07/2014   Tdap 05/05/2022   Zoster Recombinant(Shingrix) 08/22/2021, 11/02/2021    TDAP status: Up to date  Flu Vaccine status: Up to date  Pneumococcal vaccine status: Up to date  Covid-19 vaccine status: Information provided on how to obtain vaccines.   Qualifies for Shingles Vaccine? Yes   Zostavax completed Yes   Shingrix Completed?: Yes  Screening Tests Health Maintenance  Topic Date Due   COVID-19 Vaccine (4 - 2023-24 season) 02/20/2023   Diabetic kidney evaluation - Urine ACR  05/06/2023   FOOT EXAM  05/06/2023   OPHTHALMOLOGY EXAM  06/25/2023   HEMOGLOBIN A1C  09/14/2023   Diabetic kidney evaluation - eGFR measurement  03/16/2024   Medicare Annual Wellness (AWV)  04/19/2024   Colonoscopy  04/14/2028   DTaP/Tdap/Td (2 - Td or Tdap) 05/05/2032   Pneumonia Vaccine 70+ Years old  Completed   INFLUENZA VACCINE  Completed   DEXA SCAN  Completed   Hepatitis C Screening  Completed   Zoster Vaccines- Shingrix  Completed   HPV VACCINES  Aged Out    Health Maintenance  Health Maintenance Due  Topic Date Due   COVID-19 Vaccine (4 - 2023-24 season) 02/20/2023   Diabetic kidney evaluation - Urine ACR  05/06/2023    Colorectal cancer screening: Type of screening: Colonoscopy. Completed 04/14/2018. Repeat every 10 years  Mammogram status: Completed 01/25/2023. Repeat every year  Bone Density status: Completed 01/01/2021.   Lung Cancer Screening: (Low Dose CT Chest recommended if Age 44-80 years, 20 pack-year currently smoking OR have quit w/in 15years.) does not qualify.   Lung Cancer Screening Referral: no  Additional Screening:  Hepatitis C Screening: does qualify; Completed  09/01/2012  Vision Screening: Recommended annual ophthalmology exams for early detection of glaucoma and other disorders of the eye. Is the patient up to date with their annual eye exam?  Yes  Who is the provider or what is the name of the office in which the patient attends annual eye exams? [facility] If pt is not established with a provider, would they like to be referred to a provider to establish care? No .   Dental Screening: Recommended annual dental exams for proper oral hygiene  Diabetic Foot Exam: Diabetic Foot Exam: Completed 05/05/2022  Community Resource Referral / Chronic Care Management: CRR required this visit?  No   CCM required this visit?  No     Plan:     I have personally reviewed and noted the following in the patient's chart:   Medical and social history Use of alcohol, tobacco or illicit drugs  Current medications and supplements including opioid prescriptions. Patient is not currently taking opioid prescriptions. Functional ability and status Nutritional status Physical activity Advanced directives List of other physicians Hospitalizations, surgeries, and ER visits in previous 12 months Vitals Screenings to include cognitive, depression, and falls Referrals and appointments  In addition, I have reviewed and discussed with  patient certain preventive protocols, quality metrics, and best practice recommendations. A written personalized care plan for preventive services as well as general preventive health recommendations were provided to patient.     Barb Merino, LPN   13/01/6577   After Visit Summary: (In Person-Printed) AVS printed and given to the patient  Nurse Notes: none

## 2023-04-20 NOTE — Patient Instructions (Signed)
Betty Arroyo , Thank you for taking time to come for your Medicare Wellness Visit. I appreciate your ongoing commitment to your health goals. Please review the following plan we discussed and let me know if I can assist you in the future.   Referrals/Orders/Follow-Ups/Clinician Recommendations: none  This is a list of the screening recommended for you and due dates:  Health Maintenance  Topic Date Due   COVID-19 Vaccine (4 - 2023-24 season) 02/20/2023   Yearly kidney health urinalysis for diabetes  05/06/2023   Complete foot exam   05/06/2023   Eye exam for diabetics  06/25/2023   Hemoglobin A1C  09/14/2023   Yearly kidney function blood test for diabetes  03/16/2024   Medicare Annual Wellness Visit  04/19/2024   Colon Cancer Screening  04/14/2028   DTaP/Tdap/Td vaccine (2 - Td or Tdap) 05/05/2032   Pneumonia Vaccine  Completed   Flu Shot  Completed   DEXA scan (bone density measurement)  Completed   Hepatitis C Screening  Completed   Zoster (Shingles) Vaccine  Completed   HPV Vaccine  Aged Out    Advanced directives: (Copy Requested) Please bring a copy of your health care power of attorney and living will to the office to be added to your chart at your convenience.  Next Medicare Annual Wellness Visit scheduled for next year: No, office will schedule appointment  Insert Preventive Care attachment Insert FALL PREVENTION attachment if needed

## 2023-04-28 ENCOUNTER — Other Ambulatory Visit: Payer: Self-pay

## 2023-04-28 DIAGNOSIS — E7800 Pure hypercholesterolemia, unspecified: Secondary | ICD-10-CM

## 2023-04-28 MED ORDER — ROSUVASTATIN CALCIUM 40 MG PO TABS: 40.0000 mg | ORAL_TABLET | Freq: Every day | ORAL | 2 refills | Status: DC

## 2023-05-26 ENCOUNTER — Ambulatory Visit (INDEPENDENT_AMBULATORY_CARE_PROVIDER_SITE_OTHER): Payer: Medicare PPO | Admitting: Internal Medicine

## 2023-05-26 ENCOUNTER — Encounter: Payer: Self-pay | Admitting: Internal Medicine

## 2023-05-26 ENCOUNTER — Other Ambulatory Visit: Payer: Self-pay | Admitting: Internal Medicine

## 2023-05-26 VITALS — BP 124/80 | Temp 98.1°F | Ht 61.0 in | Wt 139.0 lb

## 2023-05-26 DIAGNOSIS — N1831 Chronic kidney disease, stage 3a: Secondary | ICD-10-CM | POA: Diagnosis not present

## 2023-05-26 DIAGNOSIS — E7800 Pure hypercholesterolemia, unspecified: Secondary | ICD-10-CM | POA: Diagnosis not present

## 2023-05-26 DIAGNOSIS — I129 Hypertensive chronic kidney disease with stage 1 through stage 4 chronic kidney disease, or unspecified chronic kidney disease: Secondary | ICD-10-CM

## 2023-05-26 DIAGNOSIS — M1611 Unilateral primary osteoarthritis, right hip: Secondary | ICD-10-CM

## 2023-05-26 DIAGNOSIS — E1122 Type 2 diabetes mellitus with diabetic chronic kidney disease: Secondary | ICD-10-CM | POA: Diagnosis not present

## 2023-05-26 DIAGNOSIS — Z0000 Encounter for general adult medical examination without abnormal findings: Secondary | ICD-10-CM | POA: Diagnosis not present

## 2023-05-26 DIAGNOSIS — R0982 Postnasal drip: Secondary | ICD-10-CM | POA: Diagnosis not present

## 2023-05-26 DIAGNOSIS — E2839 Other primary ovarian failure: Secondary | ICD-10-CM

## 2023-05-26 DIAGNOSIS — Z79899 Other long term (current) drug therapy: Secondary | ICD-10-CM

## 2023-05-26 LAB — POCT URINALYSIS DIPSTICK
Bilirubin, UA: NEGATIVE
Glucose, UA: POSITIVE — AB
Ketones, UA: NEGATIVE
Leukocytes, UA: NEGATIVE
Nitrite, UA: NEGATIVE
Protein, UA: POSITIVE — AB
Spec Grav, UA: >=1.03 — AB (ref 1.010–1.025)
Urobilinogen, UA: 0.2 U/dL
pH, UA: 6 (ref 5.0–8.0)

## 2023-05-26 MED ORDER — SEMAGLUTIDE(0.25 OR 0.5MG/DOS) 2 MG/3ML ~~LOC~~ SOPN: 0.2500 mg | PEN_INJECTOR | SUBCUTANEOUS | 2 refills | Status: DC

## 2023-05-26 NOTE — Patient Instructions (Signed)

## 2023-05-26 NOTE — Progress Notes (Unsigned)
I,[name], CMA,acting as a Neurosurgeon for [name], MD.,have documented all relevant documentation on the behalf of [name], MD,as directed by  [name], MD while in the presence of [name], MD.  Subjective:    Patient ID: Betty Arroyo , female    DOB: 12-23-1945 , 77 y.o.   MRN: [id number]  Chief Complaint  Patient presents with   Annual Exam   Diabetes   Hypertension   Hyperlipidemia    HPI  She is here today for a full physical examination.  She reports compliance with meds. She denies headaches, chest pain and shortness of breath. She is no longer followed by GYN. She has no specific questions or concerns.    Diabetes She presents for her follow-up diabetic visit. She has type 2 diabetes mellitus. Her disease course has been stable. Pertinent negatives for diabetes include no blurred vision, no chest pain and no fatigue. There are no hypoglycemic complications. Diabetic complications include nephropathy. Risk factors for coronary artery disease include diabetes mellitus, dyslipidemia, hypertension and post-menopausal. Current diabetic treatment includes oral agent (monotherapy). Her weight is stable. She is following a diabetic diet. She participates in exercise intermittently. Her breakfast blood glucose is taken between 8-9 am. Her breakfast blood glucose range is generally 90-110 mg/dl. Her dinner blood glucose is taken between 5-6 pm. Her dinner blood glucose range is generally 130-140 mg/dl. An ACE inhibitor/angiotensin II receptor blocker is being taken. Eye exam is current.  Hypertension This is a chronic problem. The current episode started more than 1 year ago. The problem has been gradually improving since onset. The problem is controlled. Pertinent negatives include no blurred vision or chest pain. Past treatments include ACE inhibitors, diuretics and angiotensin blockers.     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   Right > left leg   Vertigo, benign paroxysmal    Wears glasses      Family History  Problem Relation Age of Onset   Diabetes Mother    Hyperlipidemia Mother    Hypertension Mother    Varicose Veins Mother    Hyperlipidemia Brother    Hypertension Brother      Current Outpatient Medications:    acetaminophen (TYLENOL) 500 MG tablet, Take 1,000 mg by mouth every 6 (six) hours as needed., Disp: , Rfl:    Blood Glucose Monitoring Suppl (ACCU-CHEK GUIDE ME) w/Device KIT, Use as directed to check blood sugars daily., Disp: 1 kit, Rfl: 2   carvedilol (COREG) 6.25 MG tablet, TAKE 2 TABLETS BY MOUTH TWICE DAILY WITH FOOD, Disp: 180 tablet, Rfl: 2   cholecalciferol (VITAMIN D3) 25 MCG (1000 UNIT) tablet, Take 2,000 Units by mouth daily., Disp: , Rfl:    empagliflozin (JARDIANCE) 10 MG TABS tablet, Take 1 tablet (10 mg total) by mouth at bedtime., Disp: 90 tablet, Rfl: 2   glucose blood test strip, USE TO CHECK BLOOD SUGAR ONCE DAILY., Disp: 100 each, Rfl: 2   Lancets (ACCU-CHEK MULTICLIX) lancets, Use as directed to check blood sugar daily., Disp: 100 each, Rfl: 12   rosuvastatin (CRESTOR) 40 MG tablet, Take 1 tablet (40 mg total) by mouth daily., Disp: 90 tablet, Rfl: 2  triamcinolone cream (KENALOG) 0.1 %, APPLY CREAM EXTERNALLY TO AFFECTED AREA TWICE DAILY, Disp: 30 g, Rfl: 0   vitamin C (ASCORBIC ACID) 500 MG tablet, Take 500 mg by mouth at bedtime., Disp: , Rfl:    ezetimibe (ZETIA) 10 MG tablet, Take 1 tablet by mouth once daily, Disp: 90 tablet, Rfl: 2   meloxicam (MOBIC) 15 MG tablet, Take 15 mg by mouth daily. (Patient not taking: Reported on 03/17/2023), Disp: , Rfl:    Semaglutide,0.25 or 0.5MG /DOS, 2 MG/3ML SOPN, Inject 0.25 mg into the skin once a week., Disp: 6  mL, Rfl: 2   Allergies  Allergen Reactions   Farxiga [Dapagliflozin] Rash      The patient states she uses post menopausal status for birth control. No LMP recorded. Patient has had a hysterectomy.. Negative for Dysmenorrhea. Negative for: breast discharge, breast lump(s), breast pain and breast self exam. Associated symptoms include abnormal vaginal bleeding. Pertinent negatives include abnormal bleeding (hematology), anxiety, decreased libido, depression, difficulty falling sleep, dyspareunia, history of infertility, nocturia, sexual dysfunction, sleep disturbances, urinary incontinence, urinary urgency, vaginal discharge and vaginal itching. Diet regular.   . The patient's tobacco use is:  Social History   Tobacco Use  Smoking Status Never  Smokeless Tobacco Never  . She has been exposed to passive smoke. The patient's alcohol use is:  Social History   Substance and Sexual Activity  Alcohol Use No    Review of Systems  Constitutional: Negative.  Negative for fatigue.  HENT:  Positive for postnasal drip.   Eyes: Negative.  Negative for blurred vision.  Respiratory: Negative.    Cardiovascular: Negative.  Negative for chest pain.  Gastrointestinal: Negative.   Endocrine: Negative.   Genitourinary: Negative.   Musculoskeletal: Negative.   Skin: Negative.   Allergic/Immunologic: Negative.   Neurological: Negative.   Hematological: Negative.   Psychiatric/Behavioral: Negative.       Today's Vitals   05/26/23 1026  BP: 124/80  Temp: 98.1 F (36.7 C)  SpO2: 98%  Weight: 139 lb (63 kg)  Height: 5\' 1"  (1.549 m)   Body mass index is 26.26 kg/m.  Wt Readings from Last 3 Encounters:  05/26/23 139 lb (63 kg)  04/20/23 138 lb (62.6 kg)  03/17/23 139 lb (63 kg)     Objective:  Physical Exam Vitals and nursing note reviewed.  Constitutional:      Appearance: Normal appearance.  HENT:     Head: Normocephalic and atraumatic.     Right Ear: Tympanic membrane, ear canal  and external ear normal.     Left Ear: Tympanic membrane, ear canal and external ear normal.     Nose: Nose normal.     Mouth/Throat:     Mouth: Mucous membranes are moist.     Pharynx: Oropharynx is clear.  Eyes:     Extraocular Movements: Extraocular movements intact.     Conjunctiva/sclera: Conjunctivae normal.     Pupils: Pupils are equal, round, and reactive to light.  Cardiovascular:     Rate and Rhythm: Normal rate and regular rhythm.     Pulses: Normal pulses.          Dorsalis pedis pulses are 2+ on the right side and 2+ on the left side.     Heart sounds: Normal heart sounds.  Pulmonary:     Effort: Pulmonary effort is normal.     Breath sounds: Normal breath sounds.  Chest:  Breasts:    Tanner Score is 5.     Right:  Normal.     Left: Normal.  Abdominal:     General: Abdomen is flat. Bowel sounds are normal.     Palpations: Abdomen is soft.  Genitourinary:    Comments: deferred Musculoskeletal:        General: Normal range of motion.     Cervical back: Normal range of motion and neck supple.  Feet:     Right foot:     Protective Sensation: 5 sites tested.  5 sites sensed.     Skin integrity: Skin integrity normal.     Toenail Condition: Right toenails are normal.     Left foot:     Protective Sensation: 5 sites tested.  5 sites sensed.     Skin integrity: Skin integrity normal.     Toenail Condition: Left toenails are normal.  Skin:    General: Skin is warm and dry.  Neurological:     General: No focal deficit present.     Mental Status: She is alert and oriented to person, place, and time.  Psychiatric:        Mood and Affect: Mood normal.        Behavior: Behavior normal.         Assessment And Plan:     Annual physical exam Assessment & Plan: A full exam was performed.  Importance of monthly self breast exams was discussed with the patient.  She is advised to get 30-45 minutes of regular exercise, no less than four to five days per week. Both  weight-bearing and aerobic exercises are recommended.  She is advised to follow a healthy diet with at least six fruits/veggies per day, decrease intake of red meat and other saturated fats and to increase fish intake to twice weekly.  Meats/fish should not be fried -- baked, boiled or broiled is preferable. It is also important to cut back on your sugar intake.  Be sure to read labels - try to avoid anything with added sugar, high fructose corn syrup or other sweeteners.  If you must use a sweetener, you can try stevia or monkfruit.  It is also important to avoid artificially sweetened foods/beverages and diet drinks. Lastly, wear SPF 50 sunscreen on exposed skin and when in direct sunlight for an extended period of time.  Be sure to avoid fast food restaurants and aim for at least 60 ounces of water daily.       Type 2 diabetes mellitus with stage 3a chronic kidney disease, without long-term current use of insulin (HCC) Assessment & Plan: Chronic, diabetic foot exam was performed.   I will check labs as below. I will adjust meds as needed.  She will continue with Jardiance 10mg  and Ozempic 0.5mg  weekly. I DISCUSSED WITH THE PATIENT AT LENGTH REGARDING THE GOALS OF GLYCEMIC CONTROL AND POSSIBLE LONG-TERM COMPLICATIONS.  I  ALSO STRESSED THE IMPORTANCE OF COMPLIANCE WITH HOME GLUCOSE MONITORING, DIETARY RESTRICTIONS INCLUDING AVOIDANCE OF SUGARY DRINKS/PROCESSED FOODS,  ALONG WITH REGULAR EXERCISE.  I  ALSO STRESSED THE IMPORTANCE OF ANNUAL EYE EXAMS, SELF FOOT CARE AND COMPLIANCE WITH OFFICE VISITS.   Orders: -     CMP14+EGFR -     Hemoglobin A1c -     Semaglutide(0.25 or 0.5MG /DOS); Inject 0.25 mg into the skin once a week.  Dispense: 6 mL; Refill: 2 -     AMB Referral to [facility] -     PTH, intact and calcium -     Phosphorus -     Protein  electrophoresis, serum  Parenchymal renal hypertension, stage 1 through stage 4 or unspecified chronic kidney disease Assessment &  Plan: Chronic, well controlled. EKG performed, NSR w/o acute changes.  She will continue with carvedilol 6.25mg  2 tabs twice daily. She is encouraged to follow low sodium diet. She will rto in Dec 2024 for her next physical examination.   Orders: -     POCT urinalysis dipstick -     Microalbumin / creatinine urine ratio -     EKG 12-Lead  Pure hypercholesterolemia Assessment & Plan: Chronic, LDL goal is less than 70.  She will continue with rosuvastatin and ezetimibe. May need Lipid Clinic evaluation if she is unable to reach goal with both medications.   Orders: -     AMB Referral to [facility]  Postnasal drip  Estrogen deficiency -     DG Bone Density; Future  Drug therapy -     Vitamin B12     Return for 1 year HM, 4 MONTH DM.Marland Kitchen Patient was given opportunity to ask questions. Patient verbalized understanding of the plan and was able to repeat key elements of the plan. All questions were answered to their satisfaction.    I, [name], MD, have reviewed all documentation for this visit. The documentation on 05/26/23 for the exam, diagnosis, procedures, and orders are all accurate and complete.

## 2023-05-30 DIAGNOSIS — Z0000 Encounter for general adult medical examination without abnormal findings: Secondary | ICD-10-CM | POA: Insufficient documentation

## 2023-05-30 NOTE — Progress Notes (Incomplete)
I,[name], CMA,acting as a Neurosurgeon for Betty Aliment, MD.,have documented all relevant documentation on the behalf of Betty Aliment, MD,as directed by  Betty Aliment, MD while in the presence of Betty Aliment, MD.  Subjective:    Patient ID: Betty Arroyo , female    DOB: 03-31-1946 , 77 y.o.   MRN: [id number]  Chief Complaint  Patient presents with  . Annual Exam  . Diabetes  . Hypertension  . Hyperlipidemia    HPI  She is here today for a full physical examination.  She reports compliance with meds. She denies headaches, chest pain and shortness of breath. She is no longer followed by GYN. She has no specific questions or concerns.    Diabetes She presents for her follow-up diabetic visit. She has type 2 diabetes mellitus. Her disease course has been stable. Pertinent negatives for diabetes include no blurred vision, no chest pain and no fatigue. There are no hypoglycemic complications. Diabetic complications include nephropathy. Risk factors for coronary artery disease include diabetes mellitus, dyslipidemia, hypertension and post-menopausal. Current diabetic treatment includes oral agent (monotherapy). Her weight is stable. She is following a diabetic diet. She participates in exercise intermittently. Her breakfast blood glucose is taken between 8-9 am. Her breakfast blood glucose range is generally 90-110 mg/dl. Her dinner blood glucose is taken between 5-6 pm. Her dinner blood glucose range is generally 130-140 mg/dl. An ACE inhibitor/angiotensin II receptor blocker is being taken. Eye exam is current.  Hypertension This is a chronic problem. The current episode started more than 1 year ago. The problem has been gradually improving since onset. The problem is controlled. Pertinent negatives include no blurred vision or chest pain. Past treatments include ACE inhibitors, diuretics and angiotensin blockers.     Past Medical History:  Diagnosis Date  . Chronic kidney  disease (CKD), stage III (moderate) (HCC)   . Hyperlipidemia   . Hypertension   . OA (osteoarthritis)   . Parenchymal renal hypertension   . Type 2 diabetes mellitus (HCC)    followed by pcp  . 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 Vertigo, benign paroxysmal   . Wears glasses      Family History  Problem Relation Age of Onset  . Diabetes Mother   . Hyperlipidemia Mother   . Hypertension Mother   . Varicose Veins Mother   . Hyperlipidemia Brother   . Hypertension Brother      Current Outpatient Medications:  .  acetaminophen (TYLENOL) 500 MG tablet, Take 1,000 mg by mouth every 6 (six) hours as needed., Disp: , Rfl:  .  Blood Glucose Monitoring Suppl (ACCU-CHEK GUIDE ME) w/Device KIT, Use as directed to check blood sugars daily., Disp: 1 kit, Rfl: 2 .  carvedilol (COREG) 6.25 MG tablet, TAKE 2 TABLETS BY MOUTH TWICE DAILY WITH FOOD, Disp: 180 tablet, Rfl: 2 .  cholecalciferol (VITAMIN D3) 25 MCG (1000 UNIT) tablet, Take 2,000 Units by mouth daily., Disp: , Rfl:  .  empagliflozin (JARDIANCE) 10 MG TABS tablet, Take 1 tablet (10 mg total) by mouth at bedtime., Disp: 90 tablet, Rfl: 2 .  glucose blood test strip, USE TO CHECK BLOOD SUGAR ONCE DAILY., Disp: 100 each, Rfl: 2 .  Lancets (ACCU-CHEK MULTICLIX) lancets, Use as directed to check blood sugar daily., Disp: 100 each, Rfl: 12 .  rosuvastatin (CRESTOR) 40 MG tablet, Take 1 tablet (40 mg total) by mouth daily., Disp: 90 tablet, Rfl: 2 .  triamcinolone cream (KENALOG) 0.1 %, APPLY CREAM EXTERNALLY TO AFFECTED AREA TWICE DAILY, Disp: 30 g, Rfl: 0 .  vitamin C (ASCORBIC ACID) 500 MG tablet, Take 500 mg by mouth at bedtime., Disp: , Rfl:  .  ezetimibe (ZETIA) 10 MG tablet, Take 1 tablet by mouth once daily, Disp: 90 tablet, Rfl: 2 .  meloxicam (MOBIC) 15 MG tablet, Take 15 mg by mouth daily. (Patient not taking: Reported on 03/17/2023), Disp: , Rfl:  .  Semaglutide,0.25 or 0.5MG /DOS, 2 MG/3ML SOPN, Inject 0.25 mg into the  skin once a week., Disp: 6 mL, Rfl: 2   Allergies  Allergen Reactions  . Farxiga [Dapagliflozin] Rash      The patient states she uses {contraceptive methods:5051} for birth control. No LMP recorded. Patient has had a hysterectomy.. {Dysmenorrhea-menorrhagia:21918}. Negative for: breast discharge, breast lump(s), breast pain and breast self exam. Associated symptoms include abnormal vaginal bleeding. Pertinent negatives include abnormal bleeding (hematology), anxiety, decreased libido, depression, difficulty falling sleep, dyspareunia, history of infertility, nocturia, sexual dysfunction, sleep disturbances, urinary incontinence, urinary urgency, vaginal discharge and vaginal itching. Diet regular.The patient states her exercise level is    . The patient's tobacco use is:  Social History   Tobacco Use  Smoking Status Never  Smokeless Tobacco Never  . She has been exposed to passive smoke. The patient's alcohol use is:  Social History   Substance and Sexual Activity  Alcohol Use No  . Additional information: Last pap ***, next one scheduled for ***.    Review of Systems  Constitutional: Negative.  Negative for fatigue.  HENT:  Positive for postnasal drip.   Eyes: Negative.  Negative for blurred vision.  Respiratory: Negative.    Cardiovascular: Negative.  Negative for chest pain.  Gastrointestinal: Negative.   Endocrine: Negative.   Genitourinary: Negative.   Musculoskeletal: Negative.   Skin: Negative.   Allergic/Immunologic: Negative.   Neurological: Negative.   Hematological: Negative.   Psychiatric/Behavioral: Negative.       Today's Vitals   05/26/23 1026  BP: 124/80  Temp: 98.1 F (36.7 C)  SpO2: 98%  Weight: 139 lb (63 kg)  Height: 5\' 1"  (1.549 m)   Body mass index is 26.26 kg/m.  Wt Readings from Last 3 Encounters:  05/26/23 139 lb (63 kg)  04/20/23 138 lb (62.6 kg)  03/17/23 139 lb (63 kg)     Objective:  Physical Exam Vitals and nursing note  reviewed.  Constitutional:      Appearance: Normal appearance.  HENT:     Head: Normocephalic and atraumatic.     Right Ear: Tympanic membrane, ear canal and external ear normal.     Left Ear: Tympanic membrane, ear canal and external ear normal.     Nose: Nose normal.     Mouth/Throat:     Mouth: Mucous membranes are moist.     Pharynx: Oropharynx is clear.  Eyes:     Extraocular Movements: Extraocular movements intact.     Conjunctiva/sclera: Conjunctivae normal.     Pupils: Pupils are equal, round, and reactive to light.  Cardiovascular:     Rate and Rhythm: Normal rate and regular rhythm.     Pulses: Normal pulses.          Dorsalis pedis pulses are 2+ on the right side and 2+ on the left side.     Heart sounds: Normal heart sounds.  Pulmonary:     Effort: Pulmonary effort is normal.     Breath sounds: Normal breath sounds.  Chest:  Breasts:    Tanner Score is 5.     Right: Normal.     Left: Normal.  Abdominal:     General: Abdomen is flat. Bowel sounds are normal.     Palpations: Abdomen is soft.  Genitourinary:    Comments: deferred Musculoskeletal:        General: Normal range of motion.     Cervical back: Normal range of motion and neck supple.  Feet:     Right foot:     Protective Sensation: 5 sites tested.  5 sites sensed.     Skin integrity: Skin integrity normal.     Toenail Condition: Right toenails are normal.     Left foot:     Protective Sensation: 5 sites tested.  5 sites sensed.     Skin integrity: Skin integrity normal.     Toenail Condition: Left toenails are normal.  Skin:    General: Skin is warm and dry.  Neurological:     General: No focal deficit present.     Mental Status: She is alert and oriented to person, place, and time.  Psychiatric:        Mood and Affect: Mood normal.        Behavior: Behavior normal.         Assessment And Plan:     Annual physical exam  Type 2 diabetes mellitus with stage 3a chronic kidney disease,  without long-term current use of insulin (HCC) -     CMP14+EGFR -     Hemoglobin A1c -     Semaglutide(0.25 or 0.5MG /DOS); Inject 0.25 mg into the skin once a week.  Dispense: 6 mL; Refill: 2 -     AMB Referral to [facility] -     PTH, intact and calcium -     Phosphorus -     Protein electrophoresis, serum  Parenchymal renal hypertension, stage 1 through stage 4 or unspecified chronic kidney disease -     POCT urinalysis dipstick -     Microalbumin / creatinine urine ratio -     EKG 12-Lead  Pure hypercholesterolemia -     AMB Referral to [facility]  Primary osteoarthritis of right hip  Postnasal drip  Estrogen deficiency -     DG Bone Density; Future  Type 2 diabetes mellitus with stage 3a chronic kidney disease, without long-term current use of insulin (HCC) -     CMP14+EGFR -     Hemoglobin A1c -     Semaglutide(0.25 or 0.5MG /DOS); Inject 0.25 mg into the skin once a week.  Dispense: 6 mL; Refill: 2 -     AMB Referral to [facility] -     PTH, intact and calcium -     Phosphorus -     Protein electrophoresis, serum  Drug therapy -     Vitamin B12     Return for 1 year HM, 4 MONTH DM.Marland Kitchen Patient was given opportunity to ask questions. Patient verbalized understanding of the plan and was able to repeat key elements of the plan. All questions were answered to their satisfaction.    I, Betty Aliment, MD, have reviewed all documentation for this visit. The documentation on 05/26/23 for the exam, diagnosis, procedures, and orders are all accurate and complete.

## 2023-05-30 NOTE — Assessment & Plan Note (Signed)
Chronic, LDL goal is less than 70.  She will continue with rosuvastatin and ezetimibe. May need Lipid Clinic evaluation if she is unable to reach goal with both medications.

## 2023-05-30 NOTE — Assessment & Plan Note (Signed)

## 2023-05-30 NOTE — Assessment & Plan Note (Signed)
Chronic, diabetic foot exam was performed.   I will check labs as below. I will adjust meds as needed.  She will continue with Jardiance 10mg  and Ozempic 0.5mg  weekly. I DISCUSSED WITH THE PATIENT AT LENGTH REGARDING THE GOALS OF GLYCEMIC CONTROL AND POSSIBLE LONG-TERM COMPLICATIONS.  I  ALSO STRESSED THE IMPORTANCE OF COMPLIANCE WITH HOME GLUCOSE MONITORING, DIETARY RESTRICTIONS INCLUDING AVOIDANCE OF SUGARY DRINKS/PROCESSED FOODS,  ALONG WITH REGULAR EXERCISE.  I  ALSO STRESSED THE IMPORTANCE OF ANNUAL EYE EXAMS, SELF FOOT CARE AND COMPLIANCE WITH OFFICE VISITS.

## 2023-05-30 NOTE — Assessment & Plan Note (Signed)
Chronic, well controlled. EKG performed, NSR w/o acute changes.  She will continue with carvedilol 6.25mg  2 tabs twice daily. She is encouraged to follow low sodium diet. She will rto in Dec 2024 for her next physical examination.

## 2023-05-31 LAB — PROTEIN ELECTROPHORESIS, SERUM
A/G Ratio: 1.3 (ref 0.7–1.7)
Albumin ELP: 3.9 g/dL (ref 2.9–4.4)
Alpha 1: 0.2 g/dL (ref 0.0–0.4)
Alpha 2: 0.8 g/dL (ref 0.4–1.0)
Beta: 1.1 g/dL (ref 0.7–1.3)
Gamma Globulin: 0.8 g/dL (ref 0.4–1.8)
Globulin, Total: 2.9 g/dL (ref 2.2–3.9)

## 2023-05-31 LAB — CMP14+EGFR
ALT: 18 [IU]/L (ref 0–32)
AST: 20 [IU]/L (ref 0–40)
Albumin: 4.3 g/dL (ref 3.8–4.8)
Alkaline Phosphatase: 105 [IU]/L (ref 44–121)
BUN/Creatinine Ratio: 14 (ref 12–28)
BUN: 13 mg/dL (ref 8–27)
Bilirubin Total: 0.7 mg/dL (ref 0.0–1.2)
CO2: 24 mmol/L (ref 20–29)
Calcium: 9.5 mg/dL (ref 8.7–10.3)
Chloride: 103 mmol/L (ref 96–106)
Creatinine, Ser: 0.91 mg/dL (ref 0.57–1.00)
Globulin, Total: 2.5 g/dL (ref 1.5–4.5)
Glucose: 75 mg/dL (ref 70–99)
Potassium: 3.4 mmol/L — ABNORMAL LOW (ref 3.5–5.2)
Sodium: 141 mmol/L (ref 134–144)
Total Protein: 6.8 g/dL (ref 6.0–8.5)
eGFR: 66 mL/min/{1.73_m2} (ref >59)

## 2023-05-31 LAB — PTH, INTACT AND CALCIUM: PTH: 29 pg/mL (ref 15–65)

## 2023-05-31 LAB — MICROALBUMIN / CREATININE URINE RATIO
Creatinine, Urine: 74.5 mg/dL
Microalb/Creat Ratio: 243 mg/g{creat} — ABNORMAL HIGH (ref 0–29)
Microalbumin, Urine: 180.9 ug/mL

## 2023-05-31 LAB — PHOSPHORUS: Phosphorus: 3.4 mg/dL (ref 3.0–4.3)

## 2023-05-31 LAB — HEMOGLOBIN A1C
Est. average glucose Bld gHb Est-mCnc: 134 mg/dL
Hgb A1c MFr Bld: 6.3 % — ABNORMAL HIGH (ref 4.8–5.6)

## 2023-05-31 LAB — VITAMIN B12: Vitamin B-12: 1616 pg/mL — ABNORMAL HIGH (ref 232–1245)

## 2023-06-01 DIAGNOSIS — R0982 Postnasal drip: Secondary | ICD-10-CM | POA: Insufficient documentation

## 2023-06-01 NOTE — Assessment & Plan Note (Signed)
She was given samples of Zytec, 10mg  - advised to take 1/2 tablet daily as needed.  She will let me know if her sx persist. Advised to also avoid dairy and drink hot beverage daily.

## 2023-07-01 DIAGNOSIS — M8588 Other specified disorders of bone density and structure, other site: Secondary | ICD-10-CM | POA: Diagnosis not present

## 2023-07-01 DIAGNOSIS — E2839 Other primary ovarian failure: Secondary | ICD-10-CM | POA: Diagnosis not present

## 2023-07-01 DIAGNOSIS — N958 Other specified menopausal and perimenopausal disorders: Secondary | ICD-10-CM | POA: Diagnosis not present

## 2023-07-01 LAB — HM DEXA SCAN

## 2023-07-05 ENCOUNTER — Encounter: Payer: Self-pay | Admitting: Internal Medicine

## 2023-07-08 ENCOUNTER — Other Ambulatory Visit: Payer: Self-pay

## 2023-07-08 MED ORDER — MELOXICAM 15 MG PO TABS: 15.0000 mg | ORAL_TABLET | Freq: Every day | ORAL | 1 refills | Status: AC

## 2023-07-13 ENCOUNTER — Other Ambulatory Visit: Payer: Self-pay | Admitting: Podiatry

## 2023-09-10 ENCOUNTER — Other Ambulatory Visit: Payer: Self-pay | Admitting: Internal Medicine

## 2023-09-10 DIAGNOSIS — I129 Hypertensive chronic kidney disease with stage 1 through stage 4 chronic kidney disease, or unspecified chronic kidney disease: Secondary | ICD-10-CM

## 2023-09-28 ENCOUNTER — Encounter: Payer: Self-pay | Admitting: Internal Medicine

## 2023-09-28 ENCOUNTER — Ambulatory Visit: Payer: Medicare PPO | Admitting: Internal Medicine

## 2023-09-28 VITALS — BP 110/70 | Temp 98.3°F | Ht 61.0 in | Wt 139.0 lb

## 2023-09-28 DIAGNOSIS — I129 Hypertensive chronic kidney disease with stage 1 through stage 4 chronic kidney disease, or unspecified chronic kidney disease: Secondary | ICD-10-CM

## 2023-09-28 DIAGNOSIS — E1122 Type 2 diabetes mellitus with diabetic chronic kidney disease: Secondary | ICD-10-CM | POA: Diagnosis not present

## 2023-09-28 DIAGNOSIS — E2839 Other primary ovarian failure: Secondary | ICD-10-CM | POA: Insufficient documentation

## 2023-09-28 DIAGNOSIS — N1831 Chronic kidney disease, stage 3a: Secondary | ICD-10-CM

## 2023-09-28 DIAGNOSIS — M79672 Pain in left foot: Secondary | ICD-10-CM | POA: Diagnosis not present

## 2023-09-28 DIAGNOSIS — M791 Myalgia, unspecified site: Secondary | ICD-10-CM | POA: Diagnosis not present

## 2023-09-28 DIAGNOSIS — E7800 Pure hypercholesterolemia, unspecified: Secondary | ICD-10-CM

## 2023-09-28 MED ORDER — CARVEDILOL 12.5 MG PO TABS: 12.5000 mg | ORAL_TABLET | Freq: Two times a day (BID) | ORAL | 1 refills | Status: DC

## 2023-09-28 MED ORDER — EMPAGLIFLOZIN 10 MG PO TABS: 10.0000 mg | ORAL_TABLET | Freq: Every day | ORAL | 2 refills | Status: AC

## 2023-09-28 NOTE — Patient Instructions (Signed)

## 2023-09-28 NOTE — Assessment & Plan Note (Signed)
 Chronic, well controlled. She will continue with carvedilol 6.25mg  2 tabs twice daily. A new prescription for 12.5mg  tablet twice daily was sent to the pharmacy. She is encouraged to follow low sodium diet. She will rto in 4 months for re-evaluation. She is also encouraged to avoid processed meats including bacon, sausages and deli meats which tend to be high in sodium.

## 2023-09-28 NOTE — Assessment & Plan Note (Signed)
 Managed with rosuvastatin and Zetia. Reports muscle aches, possibly related to rosuvastatin. Scheduled lipid clinic visit in May. - Order muscle enzyme test to evaluate for statin-related myopathy. - Continue rosuvastatin and Zetia. - Attend lipid clinic appointment in May.

## 2023-09-28 NOTE — Assessment & Plan Note (Addendum)
 Per patient, likely due to a bone spur. Significant discomfort, especially in the left foot. Differential diagnosis includes gout, but not suspected. - She has upcoming Podiatry evaluation  - She is encouraged to wear shoes with wider toe box

## 2023-09-28 NOTE — Progress Notes (Signed)
 I,[name], CMA,acting as a Neurosurgeon for [name], MD.,have documented all relevant documentation on the behalf of [name], MD,as directed by  [name], MD while in the presence of [name], MD.  Subjective:  Patient ID: Betty Arroyo , female    DOB: [birth year] , 78 y.o.   MRN: [id number]  Chief Complaint  Patient presents with   Diabetes    Patient presents today for bp, dm & cholesterol follow up. She reports compliance with medications. Denies headache, chest pain & sob. She will call to schedule DM eye exam.     Hypertension   Hyperlipidemia    HPI Discussed the use of AI scribe software for clinical note transcription with the patient, who gave verbal consent to proceed.  History of Present Illness Betty Arroyo is a 78 year old female with diabetes and hypertension who presents for a routine check-up focusing on diabetes management.  She regularly monitors her blood glucose levels, with a recent reading of 93 mg/dL in the morning last week. She has an eye exam scheduled for next week after a previous unsuccessful walk-in attempt due to long wait times.  Her blood pressure has been stable with the current regimen of carvedilol, two tablets twice a day.  She experiences significant left foot pain, severe enough to make wearing shoes difficult, and suspects a bone spur. No suspicion of gout.  Her current medications include Jardiance, Zetia, meloxicam as needed for pain, rosuvastatin, and Ozempic. She is concerned about potential side effects of her medications causing bone aches, but is unsure if this is due to medication or aging. She pays for Jardiance and Ozempic out of pocket due to insurance coverage issues.      Diabetes She presents for her follow-up diabetic visit. She has type 2 diabetes mellitus. Her disease course has been stable. Pertinent negatives for diabetes include no blurred vision, no fatigue, no polydipsia, no polyphagia  and no polyuria. There are no hypoglycemic complications. Diabetic complications include nephropathy. Risk factors for coronary artery disease include diabetes mellitus, dyslipidemia, hypertension and post-menopausal. Current diabetic treatment includes oral agent (monotherapy). Her weight is stable. She is following a diabetic diet. She participates in exercise intermittently. Her breakfast blood glucose is taken between 8-9 am. Her breakfast blood glucose range is generally 90-110 mg/dl. Her dinner blood glucose is taken between 5-6 pm. Her dinner blood glucose range is generally 130-140 mg/dl. An ACE inhibitor/angiotensin II receptor blocker is being taken. Eye exam is current.  Hypertension This is a chronic problem. The current episode started more than 1 year ago. The problem has been gradually improving since onset. The problem is controlled. Pertinent negatives include no blurred vision. Past treatments include ACE inhibitors, diuretics and angiotensin blockers.     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   Right > left leg   Vertigo, benign paroxysmal    Wears glasses      Family History  Problem Relation Age of Onset   Diabetes Mother    Hyperlipidemia Mother    Hypertension Mother    Varicose Veins Mother    Hyperlipidemia Brother    Hypertension Brother      Current  Outpatient Medications:    acetaminophen (TYLENOL) 500 MG tablet, Take 1,000 mg by mouth every 6 (six) hours as needed., Disp: , Rfl:    Blood Glucose Monitoring Suppl (ACCU-CHEK GUIDE ME) w/Device KIT, Use as directed to check blood sugars daily., Disp: 1 kit, Rfl: 2   carvedilol (COREG) 12.5 MG tablet, Take 1 tablet (12.5 mg total) by mouth 2 (two) times daily., Disp: 180 tablet,  Rfl: 1   cholecalciferol (VITAMIN D3) 25 MCG (1000 UNIT) tablet, Take 2,000 Units by mouth daily., Disp: , Rfl:    ezetimibe (ZETIA) 10 MG tablet, Take 1 tablet by mouth once daily, Disp: 90 tablet, Rfl: 2   glucose blood test strip, USE TO CHECK BLOOD SUGAR ONCE DAILY., Disp: 100 each, Rfl: 2   Lancets (ACCU-CHEK MULTICLIX) lancets, Use as directed to check blood sugar daily., Disp: 100 each, Rfl: 12   meloxicam (MOBIC) 15 MG tablet, Take 1 tablet (15 mg total) by mouth daily., Disp: 30 tablet, Rfl: 1   rosuvastatin (CRESTOR) 40 MG tablet, Take 1 tablet (40 mg total) by mouth daily., Disp: 90 tablet, Rfl: 2   Semaglutide,0.25 or 0.5MG /DOS, 2 MG/3ML SOPN, Inject 0.25 mg into the skin once a week., Disp: 6 mL, Rfl: 2   triamcinolone cream (KENALOG) 0.1 %, APPLY CREAM EXTERNALLY TO AFFECTED AREA TWICE DAILY, Disp: 30 g, Rfl: 0   vitamin C (ASCORBIC ACID) 500 MG tablet, Take 500 mg by mouth at bedtime., Disp: , Rfl:    empagliflozin (JARDIANCE) 10 MG TABS tablet, Take 1 tablet (10 mg total) by mouth at bedtime., Disp: 90 tablet, Rfl: 2   Allergies  Allergen Reactions   Farxiga [Dapagliflozin] Rash     Review of Systems  Constitutional: Negative.  Negative for fatigue.  Eyes:  Negative for blurred vision.  Respiratory: Negative.    Cardiovascular: Negative.   Endocrine: Negative for polydipsia, polyphagia and polyuria.  Musculoskeletal:  Positive for myalgias.  Neurological: Negative.   Psychiatric/Behavioral: Negative.       Today's Vitals   09/28/23 1500  BP: 110/70  Temp: 98.3 F (36.8 C)  SpO2: 98%  Weight: 139 lb (63 kg)  Height: 5\' 1"  (1.549 m)   Body mass index is 26.26 kg/m.  Wt Readings from Last 3 Encounters:  09/28/23 139 lb (63 kg)  05/26/23 139 lb (63 kg)  04/20/23 138 lb (62.6 kg)     Objective:  Physical Exam Vitals and nursing note reviewed.  Constitutional:      Appearance: Normal appearance.  HENT:     Head: Normocephalic and atraumatic.  Eyes:      Extraocular Movements: Extraocular movements intact.  Cardiovascular:     Rate and Rhythm: Normal rate and regular rhythm.     Heart sounds: Normal heart sounds.  Pulmonary:     Effort: Pulmonary effort is normal.     Breath sounds: Normal breath sounds.  Musculoskeletal:     Cervical back: Normal range of motion.  Skin:    General: Skin is warm.  Neurological:     General: No focal deficit present.     Mental Status: She is alert.  Psychiatric:        Mood and Affect: Mood normal.        Behavior: Behavior normal.         Assessment And Plan:  Type 2 diabetes mellitus with stage 3a chronic kidney disease, without long-term current use of insulin (HCC) Assessment & Plan: Chronic, I will check labs as below.  I will adjust meds as needed.   Managed with Jardiance and Ozempic. Blood sugar level at 93 mg/dL indicates good control. Compliant with medication regimen. - Continue Jardiance and Ozempic. - Order A1c test. - Monitor blood sugar levels regularly.  Orders: -     CMP14+EGFR -     Hemoglobin A1c -     Empagliflozin; Take 1 tablet (10 mg total) by mouth at bedtime.  Dispense: 90 tablet; Refill: 2  Parenchymal renal hypertension, stage 1 through stage 4 or unspecified chronic kidney disease Assessment & Plan: Chronic, well controlled. She will continue with carvedilol 6.25mg  2 tabs twice daily. A new prescription for 12.5mg  tablet twice daily was sent to the pharmacy. She is encouraged to follow low sodium diet. She will rto in 4 months for re-evaluation. She is also encouraged to avoid processed meats including bacon, sausages and deli meats which tend to be high in sodium.   Orders: -     CMP14+EGFR  Pure hypercholesterolemia Assessment & Plan: Managed with rosuvastatin and Zetia. Reports muscle aches, possibly related to rosuvastatin. Scheduled lipid clinic visit in May. - Order muscle enzyme test to evaluate for statin-related myopathy. - Continue rosuvastatin and  Zetia. - Attend lipid clinic appointment in May.   Left foot pain Assessment & Plan: Per patient, likely due to a bone spur. Significant discomfort, especially in the left foot. Differential diagnosis includes gout, but not suspected. - She has upcoming Podiatry evaluation  - She is encouraged to wear shoes with wider toe box    Myalgia -     CK  Other orders -     Carvedilol; Take 1 tablet (12.5 mg total) by mouth 2 (two) times daily.  Dispense: 180 tablet; Refill: 1  General Health Maintenance Due for an eye exam. Cholesterol levels to be checked at a future appointment due to fasting requirements. - Attend scheduled eye exam next week. - Schedule morning appointment for cholesterol test.  Return in 4 months (on 01/28/2024) for 4 month dm f/u.Marland Kitchen  Patient was given opportunity to ask questions. Patient verbalized understanding of the plan and was able to repeat key elements of the plan. All questions were answered to their satisfaction.    I, [name], MD, have reviewed all documentation for this visit. The documentation on 09/28/23 for the exam, diagnosis, procedures, and orders are all accurate and complete.   IF YOU HAVE BEEN REFERRED TO A SPECIALIST, IT MAY TAKE 1-2 WEEKS TO SCHEDULE/PROCESS THE REFERRAL. IF YOU HAVE NOT HEARD FROM US/SPECIALIST IN TWO WEEKS, PLEASE GIVE [place] A CALL AT [phone].   THE PATIENT IS ENCOURAGED TO PRACTICE SOCIAL DISTANCING DUE TO THE COVID-19 PANDEMIC.

## 2023-09-28 NOTE — Assessment & Plan Note (Signed)
 Chronic, I will check labs as below. I will adjust meds as needed.   Managed with Jardiance and Ozempic. Blood sugar level at 93 mg/dL indicates good control. Compliant with medication regimen. - Continue Jardiance and Ozempic. - Order A1c test. - Monitor blood sugar levels regularly.

## 2023-09-29 ENCOUNTER — Encounter: Payer: Self-pay | Admitting: Internal Medicine

## 2023-09-29 LAB — HEMOGLOBIN A1C
Est. average glucose Bld gHb Est-mCnc: 131 mg/dL
Hgb A1c MFr Bld: 6.2 % — ABNORMAL HIGH (ref 4.8–5.6)

## 2023-09-29 LAB — CMP14+EGFR
ALT: 22 IU/L (ref 0–32)
AST: 23 IU/L (ref 0–40)
Albumin: 4.3 g/dL (ref 3.8–4.8)
Alkaline Phosphatase: 94 IU/L (ref 44–121)
BUN/Creatinine Ratio: 12 (ref 12–28)
BUN: 16 mg/dL (ref 8–27)
Bilirubin Total: 0.7 mg/dL (ref 0.0–1.2)
CO2: 23 mmol/L (ref 20–29)
Calcium: 9.4 mg/dL (ref 8.7–10.3)
Chloride: 105 mmol/L (ref 96–106)
Creatinine, Ser: 1.31 mg/dL — ABNORMAL HIGH (ref 0.57–1.00)
Globulin, Total: 2.4 g/dL (ref 1.5–4.5)
Glucose: 92 mg/dL (ref 70–99)
Potassium: 3.8 mmol/L (ref 3.5–5.2)
Sodium: 142 mmol/L (ref 134–144)
Total Protein: 6.7 g/dL (ref 6.0–8.5)
eGFR: 42 mL/min/{1.73_m2} — ABNORMAL LOW (ref >59)

## 2023-09-29 LAB — CK: Total CK: 178 U/L (ref 32–182)

## 2023-10-05 ENCOUNTER — Ambulatory Visit: Admitting: Podiatry

## 2023-10-05 DIAGNOSIS — M21622 Bunionette of left foot: Secondary | ICD-10-CM | POA: Diagnosis not present

## 2023-10-05 NOTE — Progress Notes (Signed)
 Subjective:  Patient ID: Betty Arroyo, female    DOB: 18-Aug-1945,  MRN: [id number]  Chief Complaint  Patient presents with   Foot Pain    Left foot 5th toe burning sensation     78 y.o. female presents with the above complaint.  Patient presents with new complaint left foot tailor's bunion.  She states is causing her some discomfort wanted to get it evaluated.  She states is causing her some burning sensation.  She wears standard shoes with a standard toebox.  She does not wear wide toebox.  She has not seen MRIs prior to seeing me for this.  Pain scale is 5 out of 10 dull aching nature mostly when ambulating   Review of Systems: Negative except as noted in the HPI. Denies N/V/F/Ch.  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   Right > left leg   Vertigo, benign paroxysmal    Wears glasses     Current Outpatient Medications:    acetaminophen (TYLENOL) 500 MG tablet, Take 1,000 mg by mouth every 6 (six) hours as needed., Disp: , Rfl:    Blood Glucose Monitoring Suppl (ACCU-CHEK GUIDE ME) w/Device KIT, Use as directed to check blood sugars daily., Disp: 1 kit, Rfl: 2   carvedilol (COREG) 12.5 MG tablet, Take 1 tablet (12.5 mg total) by mouth 2 (two) times daily., Disp: 180 tablet, Rfl: 1   cholecalciferol (VITAMIN D3) 25 MCG (1000 UNIT) tablet, Take 2,000 Units by mouth daily., Disp: , Rfl:    empagliflozin (JARDIANCE) 10 MG TABS tablet, Take 1 tablet (10 mg total) by mouth at bedtime., Disp: 90 tablet, Rfl: 2   ezetimibe (ZETIA) 10 MG tablet, Take 1 tablet by mouth once daily, Disp: 90 tablet, Rfl: 2   glucose blood test strip, USE TO CHECK BLOOD SUGAR ONCE DAILY., Disp: 100 each, Rfl: 2   Lancets (ACCU-CHEK MULTICLIX) lancets, Use as directed to check  blood sugar daily., Disp: 100 each, Rfl: 12   meloxicam (MOBIC) 15 MG tablet, Take 1 tablet (15 mg total) by mouth daily., Disp: 30 tablet, Rfl: 1   rosuvastatin (CRESTOR) 40 MG tablet, Take 1 tablet (40 mg total) by mouth daily., Disp: 90 tablet, Rfl: 2   Semaglutide,0.25 or 0.5MG /DOS, 2 MG/3ML SOPN, Inject 0.25 mg into the skin once a week., Disp: 6 mL, Rfl: 2   triamcinolone cream (KENALOG) 0.1 %, APPLY CREAM EXTERNALLY TO AFFECTED AREA TWICE DAILY, Disp: 30 g, Rfl: 0   vitamin C (ASCORBIC ACID) 500 MG tablet, Take 500 mg by mouth at bedtime., Disp: , Rfl:   Social History   Tobacco Use  Smoking Status Never  Smokeless Tobacco Never    Allergies  Allergen Reactions   Farxiga [Dapagliflozin] Rash   Objective:  There were no vitals filed for this visit. There is no height or weight on file to calculate BMI. Constitutional Well developed. Well nourished.  Vascular Dorsalis pedis pulses palpable bilaterally. Posterior tibial pulses palpable bilaterally. Capillary refill normal to all digits.  No cyanosis or clubbing noted. Pedal hair growth normal.  Neurologic Normal speech. Oriented to person, place, and time. Epicritic sensation to light touch grossly present bilaterally.  Dermatologic Nails well groomed and normal in appearance. No  open wounds. No skin lesions.  Orthopedic: Pain on palpation left tailor's bunion palpable prominent tailor met fifth met head noted.  Mild pain on palpation.  No pain with range of motion of fifth metatarsophalangeal joint.  Fifth digit semiflexible hammertoe contracture noted.   Radiographs: None Assessment:   1. Tailor's bunionette, left    Plan:  Patient was evaluated and treated and all questions answered.  Left fifth tailor's bunion - All questions and concerns were discussed with the patient in extensive detail given the amount of pain that she is having she will benefit from her modification I discussed new shoes with wide toe box she  states understand will obtain them right away.  I also discussed floating osteotomy in the future if there is no resolve meant she states understanding  No follow-ups on file.

## 2023-10-13 LAB — HM DIABETES EYE EXAM

## 2023-10-15 DIAGNOSIS — H40033 Anatomical narrow angle, bilateral: Secondary | ICD-10-CM | POA: Diagnosis not present

## 2023-10-15 DIAGNOSIS — E119 Type 2 diabetes mellitus without complications: Secondary | ICD-10-CM | POA: Diagnosis not present

## 2023-11-01 ENCOUNTER — Ambulatory Visit (HOSPITAL_BASED_OUTPATIENT_CLINIC_OR_DEPARTMENT_OTHER): Payer: Medicare PPO | Admitting: Internal Medicine

## 2023-11-01 ENCOUNTER — Encounter (HOSPITAL_BASED_OUTPATIENT_CLINIC_OR_DEPARTMENT_OTHER): Payer: Self-pay | Admitting: Internal Medicine

## 2023-11-01 VITALS — BP 132/66 | HR 60 | Ht 61.0 in | Wt 148.0 lb

## 2023-11-01 DIAGNOSIS — E119 Type 2 diabetes mellitus without complications: Secondary | ICD-10-CM | POA: Diagnosis not present

## 2023-11-01 DIAGNOSIS — E785 Hyperlipidemia, unspecified: Secondary | ICD-10-CM | POA: Diagnosis not present

## 2023-11-01 DIAGNOSIS — I10 Essential (primary) hypertension: Secondary | ICD-10-CM | POA: Diagnosis not present

## 2023-11-01 NOTE — Patient Instructions (Signed)
 Medication Instructions:  Dr. Maximo Spar recommends Repatha (PCSK9). This is an injectable cholesterol medication self-administered once every 14 days. This medication will likely need prior approval with your insurance company, which we will work on. If the medication is not approved initially, we may need to do an appeal with your insurance.   Administer medication in area of fatty tissue such as abdomen, outer thigh, back of upper arm - and rotate site with each injection Store medication in refrigerator until ready to administer - allow to sit at room temp for 30 mins - 1 hour prior to injection Dispose of medication in a SHARPS container - your pharmacy should be able to direct you on this and proper disposal   If you need a co-pay card for Repatha: Lawsponsor.fr If you need a co-pay card for Praluent: [email or web address]  Patient Assistance:    These foundations have funds at various times.   The [employer] Foundation: https://www.panfoundation.org/disease-funds/hypercholesterolemia/ -- can sign up for wait list  The [facility] offers assistance to help pay for medication copays.  They will cover copays for all cholesterol lowering meds, including statins, fibrates, omega-3 fish oils like Vascepa, ezetimibe , Repatha, Praluent, Nexletol, Nexlizet.  The cards are usually good for $2,500 or 12 months, whichever comes first. Our fax # is [phone] (you will need this to apply) Go to healthwellfoundation.org Click on "Apply Now" Answer questions as to whom is applying (patient or representative) Your disease fund will be "hypercholesterolemia - Medicare access" They will ask questions about finances and which medications you are taking for cholesterol When you submit, the approval is usually within minutes.  You will need to print the card information from the site You will need to show this information to your pharmacy, they will bill your  Medicare Part D plan first -then bill Health Well --for the copay.   You can also call them at [phone], although the hold times can be quite long.   *If you need a refill on your cardiac medications before your next appointment, please call your pharmacy*  Lab Work: NMR and LPa in 3-4 months (Aug/Sept) If you have labs (blood work) drawn today and your tests are completely normal, you will receive your results only by: MyChart Message (if you have MyChart) OR A paper copy in the mail If you have any lab test that is abnormal or we need to change your treatment, we will call you to review the results.  Follow-Up: At [facility], you and your health needs are our priority.  As part of our continuing mission to provide you with exceptional heart care, our providers are all part of one team.  This team includes your primary Cardiologist (physician) and Advanced Practice Providers or APPs (Physician Assistants and Nurse Practitioners) who all work together to provide you with the care you need, when you need it.  Your next appointment:   Dr. Maximo Spar at [facility] after repeat lab work

## 2023-11-01 NOTE — Progress Notes (Signed)
 LIPID CLINIC CONSULT NOTE  Chief Complaint:  Manage dyslipidemia  Primary Care Physician: Cleave Curling, MD  Primary Cardiologist:  None  HPI:  Betty Arroyo is a 78 y.o. female who is being seen today for the evaluation of dyslipidemia at the request of Cleave Curling, MD. this is a pleasant 78 year old female kindly referred for evaluation management of dyslipidemia.  She has a history of type 2 diabetes, chronic kidney disease, hypertension and dyslipidemia as well as varicose veins and a family history of diabetes.  Her LDL has not been at goal less than 70.  She is on combination therapy with rosuvastatin  40 mg daily and ezetimibe  10 mg daily.  She is also on semaglutide  and has had some weight loss with this.  Despite that her recent lipid profile showed total cholesterol 156, triglycerides 77, HDL 46 and LDL 95.  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   Right > left leg   Vertigo, benign paroxysmal    Wears glasses     Past Surgical History:  Procedure Laterality Date   COLONOSCOPY  2019   HALLUX FUSION Left 02/01/2022   Procedure: HALLUX FUSION, METATARSAL PHALANGEAL JOINT;  Surgeon: Velma Ghazi, DPM;  Location: [facility];  Service: Podiatry;  Laterality: Left;  WITH BLOCK   TONSILLECTOMY  1984   TOTAL HIP ARTHROPLASTY 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   TOTAL KNEE ARTHROPLASTY Left 02/16/2019   Procedure: TOTAL KNEE ARTHROPLASTY;  Surgeon: Neil Balls, MD;  Location: [facility];  Service: Orthopedics;  Laterality: Left;   VAGINAL HYSTERECTOMY     age 66    FAMHx:  Family History  Problem Relation Age of Onset   Diabetes Mother    Hyperlipidemia Mother    Hypertension Mother    Varicose Veins Mother    Hyperlipidemia  Brother    Hypertension Brother     SOCHx:   reports that she has never smoked. She has never used smokeless tobacco. She reports that she does not drink alcohol and does not use drugs.  ALLERGIES:  Allergies  Allergen Reactions   Farxiga  [Dapagliflozin ] Rash    ROS: Pertinent items noted in HPI and remainder of comprehensive ROS otherwise negative.  HOME MEDS: Current Outpatient Medications on File Prior to Visit  Medication Sig Dispense Refill   acetaminophen  (TYLENOL ) 500 MG tablet Take 1,000 mg by mouth every 6 (six) hours as needed.     Blood Glucose Monitoring Suppl (ACCU-CHEK GUIDE ME) w/Device KIT Use as directed to check blood sugars daily. 1 kit 2   carvedilol  (COREG ) 12.5 MG tablet Take 1 tablet (12.5 mg total) by mouth 2 (two) times daily. 180 tablet 1   cholecalciferol (VITAMIN D3) 25 MCG (1000 UNIT) tablet Take 2,000 Units by mouth daily.     empagliflozin  (JARDIANCE ) 10 MG TABS tablet Take 1 tablet (10 mg total) by mouth at bedtime. 90 tablet 2   ezetimibe  (ZETIA ) 10 MG tablet Take 1 tablet by mouth once daily 90 tablet 2   glucose blood test strip USE TO CHECK BLOOD SUGAR ONCE DAILY. 100 each 2   Lancets (ACCU-CHEK MULTICLIX) lancets Use as directed to check blood sugar daily. 100 each 12   meloxicam  (  MOBIC ) 15 MG tablet Take 1 tablet (15 mg total) by mouth daily. 30 tablet 1   rosuvastatin  (CRESTOR ) 40 MG tablet Take 1 tablet (40 mg total) by mouth daily. 90 tablet 2   Semaglutide ,0.25 or 0.5MG /DOS, 2 MG/3ML SOPN Inject 0.25 mg into the skin once a week. 6 mL 2   triamcinolone  cream (KENALOG ) 0.1 % APPLY CREAM EXTERNALLY TO AFFECTED AREA TWICE DAILY 30 g 0   vitamin C (ASCORBIC ACID) 500 MG tablet Take 500 mg by mouth at bedtime.     No current facility-administered medications on file prior to visit.    LABS/IMAGING: No results found for this or any previous visit (from the past 48 hours). No results found.  LIPID PANEL:    Component Value Date/Time   CHOL  156 03/17/2023 0958   TRIG 77 03/17/2023 0958   HDL 46 03/17/2023 0958   CHOLHDL 3.4 03/17/2023 0958   LDLCALC 95 03/17/2023 0958    WEIGHTS: Wt Readings from Last 3 Encounters:  11/01/23 148 lb (67.1 kg)  09/28/23 139 lb (63 kg)  05/26/23 139 lb (63 kg)    VITALS: BP 132/66 (BP Location: Left Arm, Patient Position: Sitting, Cuff Size: Normal)   Pulse 60   Ht 5\' 1"  (1.549 m)   Wt 148 lb (67.1 kg)   BMI 27.96 kg/m   EXAM: Deferred  EKG: Deferred  ASSESSMENT: Dyslipidemia, goal LDL less than 70 Type 2 diabetes Hypertension CKD stage III  PLAN: 1.   Ms. Springett has a persistent dyslipidemia despite being on high intensity statin therapy and ezetimibe .  LDL remains above target.  She would benefit from additional lipid-lowering.  As per clinical trial results with PCSK9 inhibitor she would benefit from additional lipid-lowering below LDL 70.  Will plan to reach out for prior authorization for Repatha.  Check an NMR and LP(a) in 3 to 4 months on therapy.  Thanks again for the kind referral.  Hazle Lites, MD, [facility], FNLA, FACP  [place]  Medical Director of the Advanced Lipid Disorders &  Cardiovascular Risk Reduction Clinic Diplomate of the American Board of Clinical Lipidology Attending Cardiologist  Direct Dial: [phone]  Fax: [phone]  Website:  [email or web address] 11/01/2023, 2:18 PM

## 2023-11-02 ENCOUNTER — Telehealth: Payer: Self-pay

## 2023-11-02 ENCOUNTER — Other Ambulatory Visit (HOSPITAL_COMMUNITY): Payer: Self-pay

## 2023-11-02 DIAGNOSIS — E785 Hyperlipidemia, unspecified: Secondary | ICD-10-CM

## 2023-11-02 NOTE — Telephone Encounter (Signed)
-----   Message from Nurse Jocabed C sent at 11/01/2023  2:26 PM EDT ----- Regarding: PA for Repatha Good afternoon! This patient was prescribed Repatha today and will need a PA. Thank you.

## 2023-11-02 NOTE — Telephone Encounter (Signed)
 Pharmacy Patient Advocate Encounter   Received notification from Physician's Office that prior authorization for REPATHA is required/requested.   Insurance verification completed.   The patient is insured through [employer] .   Per test claim: PA required; PA submitted to above mentioned insurance via CoverMyMeds Key/confirmation #/EOC [id number] Status is pending

## 2023-11-03 ENCOUNTER — Other Ambulatory Visit (HOSPITAL_COMMUNITY): Payer: Self-pay

## 2023-11-03 MED ORDER — REPATHA SURECLICK 140 MG/ML ~~LOC~~ SOAJ: 140.0000 mg | SUBCUTANEOUS | 11 refills | Status: AC

## 2023-11-03 NOTE — Telephone Encounter (Signed)
 Rx(s) sent to pharmacy electronically.

## 2023-11-03 NOTE — Addendum Note (Signed)
 Addended by: Francesco Inks on: 11/03/2023 11:54 AM   Modules accepted: Orders

## 2023-11-03 NOTE — Telephone Encounter (Signed)
 Pharmacy Patient Advocate Encounter  Received notification from [facility] that Prior Authorization for REPATHA has been APPROVED from 06/22/23 to 06/20/24. Ran test claim, Copay is $40. This test claim was processed through [facility]- copay amounts may vary at other pharmacies due to pharmacy/plan contracts, or as the patient moves through the different stages of their insurance plan.

## 2023-11-03 NOTE — Telephone Encounter (Signed)
 Patient updated via MyChart

## 2023-11-03 NOTE — Telephone Encounter (Deleted)
 Rx(s) sent to pharmacy electronically.

## 2024-01-23 ENCOUNTER — Encounter (HOSPITAL_BASED_OUTPATIENT_CLINIC_OR_DEPARTMENT_OTHER): Payer: Self-pay | Admitting: Nurse Practitioner

## 2024-01-24 ENCOUNTER — Other Ambulatory Visit: Payer: Self-pay | Admitting: Internal Medicine

## 2024-01-24 DIAGNOSIS — E7800 Pure hypercholesterolemia, unspecified: Secondary | ICD-10-CM

## 2024-01-30 DIAGNOSIS — Z1231 Encounter for screening mammogram for malignant neoplasm of breast: Secondary | ICD-10-CM | POA: Diagnosis not present

## 2024-01-30 LAB — HM MAMMOGRAPHY

## 2024-02-06 NOTE — Progress Notes (Unsigned)
 I,Victoria T Emmitt, CMA,acting as a Neurosurgeon for Catheryn LOISE Slocumb, MD.,have documented all relevant documentation on the behalf of Catheryn LOISE Slocumb, MD,as directed by  Catheryn LOISE Slocumb, MD while in the presence of Catheryn LOISE Slocumb, MD.  Subjective:  Patient ID: Betty Arroyo , female    DOB: [birth year] , 78 y.o.   MRN: [id number]  No chief complaint on file.   HPI     Diabetes She presents for her follow-up diabetic visit. She has type 2 diabetes mellitus. Her disease course has been stable. Pertinent negatives for diabetes include no blurred vision, no fatigue, no polydipsia, no polyphagia and no polyuria. There are no hypoglycemic complications. Diabetic complications include nephropathy. Risk factors for coronary artery disease include diabetes mellitus, dyslipidemia, hypertension and post-menopausal. Current diabetic treatment includes oral agent (monotherapy). Her weight is stable. She is following a diabetic diet. She participates in exercise intermittently. Her breakfast blood glucose is taken between 8-9 am. Her breakfast blood glucose range is generally 90-110 mg/dl. Her dinner blood glucose is taken between 5-6 pm. Her dinner blood glucose range is generally 130-140 mg/dl. An ACE inhibitor/angiotensin II receptor blocker is being taken. Eye exam is current.  Hypertension This is a chronic problem. The current episode started more than 1 year ago. The problem has been gradually improving since onset. The problem is controlled. Pertinent negatives include no blurred vision. Past treatments include ACE inhibitors, diuretics and angiotensin blockers.     Past Medical History:  Diagnosis Date  . Chronic kidney disease (CKD), stage III (moderate) (HCC)   . Hyperlipidemia   . Hypertension   . OA (osteoarthritis)   . Parenchymal renal hypertension   . Type 2 diabetes mellitus (HCC)    followed by pcp  . 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 Vertigo, benign paroxysmal   . Wears  glasses      Family History  Problem Relation Age of Onset  . Diabetes Mother   . Hyperlipidemia Mother   . Hypertension Mother   . Varicose Veins Mother   . Hyperlipidemia Brother   . Hypertension Brother      Current Outpatient Medications:  .  acetaminophen  (TYLENOL ) 500 MG tablet, Take 1,000 mg by mouth every 6 (six) hours as needed., Disp: , Rfl:  .  Blood Glucose Monitoring Suppl (ACCU-CHEK GUIDE ME) w/Device KIT, Use as directed to check blood sugars daily., Disp: 1 kit, Rfl: 2 .  carvedilol  (COREG ) 12.5 MG tablet, Take 1 tablet (12.5 mg total) by mouth 2 (two) times daily., Disp: 180 tablet, Rfl: 1 .  cholecalciferol (VITAMIN D3) 25 MCG (1000 UNIT) tablet, Take 2,000 Units by mouth daily., Disp: , Rfl:  .  empagliflozin  (JARDIANCE ) 10 MG TABS tablet, Take 1 tablet (10 mg total) by mouth at bedtime., Disp: 90 tablet, Rfl: 2 .  Evolocumab  (REPATHA  SURECLICK) 140 MG/ML SOAJ, Inject 140 mg into the skin every 14 (fourteen) days., Disp: 2 mL, Rfl: 11 .  ezetimibe  (ZETIA ) 10 MG tablet, Take 1 tablet by mouth once daily, Disp: 90 tablet, Rfl: 2 .  glucose blood test strip, USE TO CHECK BLOOD SUGAR ONCE DAILY., Disp: 100 each, Rfl: 2 .  Lancets (ACCU-CHEK MULTICLIX) lancets, Use as directed to check blood sugar daily., Disp: 100 each, Rfl: 12 .  meloxicam  (MOBIC ) 15 MG tablet, Take 1 tablet (15 mg total) by mouth daily., Disp: 30 tablet, Rfl: 1 .  rosuvastatin  (CRESTOR ) 40 MG tablet, Take 1 tablet by mouth  once daily, Disp: 90 tablet, Rfl: 2 .  Semaglutide ,0.25 or 0.5MG /DOS, 2 MG/3ML SOPN, Inject 0.25 mg into the skin once a week., Disp: 6 mL, Rfl: 2 .  triamcinolone  cream (KENALOG ) 0.1 %, APPLY CREAM EXTERNALLY TO AFFECTED AREA TWICE DAILY, Disp: 30 g, Rfl: 0 .  vitamin C (ASCORBIC ACID) 500 MG tablet, Take 500 mg by mouth at bedtime., Disp: , Rfl:    Allergies  Allergen Reactions  . Farxiga  [Dapagliflozin ] Rash     Review of Systems  Constitutional: Negative.  Negative for  fatigue.  Eyes:  Negative for blurred vision.  Respiratory: Negative.    Cardiovascular: Negative.   Endocrine: Negative for polydipsia, polyphagia and polyuria.  Neurological: Negative.   Psychiatric/Behavioral: Negative.       There were no vitals filed for this visit. There is no height or weight on file to calculate BMI.  Wt Readings from Last 3 Encounters:  11/01/23 148 lb (67.1 kg)  09/28/23 139 lb (63 kg)  05/26/23 139 lb (63 kg)     Objective:  Physical Exam      Assessment And Plan:  Type 2 diabetes mellitus with stage 3a chronic kidney disease, without long-term current use of insulin  (HCC)  Parenchymal renal hypertension, stage 1 through stage 4 or unspecified chronic kidney disease  Pure hypercholesterolemia  Myalgia  Estrogen deficiency     No follow-ups on file.  Patient was given opportunity to ask questions. Patient verbalized understanding of the plan and was able to repeat key elements of the plan. All questions were answered to their satisfaction.  Catheryn LOISE Slocumb, MD  I, Catheryn LOISE Slocumb, MD, have reviewed all documentation for this visit. The documentation on 02/06/24 for the exam, diagnosis, procedures, and orders are all accurate and complete.   IF YOU HAVE BEEN REFERRED TO A SPECIALIST, IT MAY TAKE 1-2 WEEKS TO SCHEDULE/PROCESS THE REFERRAL. IF YOU HAVE NOT HEARD FROM US /SPECIALIST IN TWO WEEKS, PLEASE GIVE US  A CALL AT [phone].   THE PATIENT IS ENCOURAGED TO PRACTICE SOCIAL DISTANCING DUE TO THE COVID-19 PANDEMIC.

## 2024-02-06 NOTE — Patient Instructions (Signed)

## 2024-02-07 ENCOUNTER — Encounter: Payer: Self-pay | Admitting: Internal Medicine

## 2024-02-07 ENCOUNTER — Ambulatory Visit: Admitting: Internal Medicine

## 2024-02-07 VITALS — BP 120/60 | HR 68 | Temp 97.6°F | Ht 61.0 in | Wt 144.0 lb

## 2024-02-07 DIAGNOSIS — M791 Myalgia, unspecified site: Secondary | ICD-10-CM

## 2024-02-07 DIAGNOSIS — E2839 Other primary ovarian failure: Secondary | ICD-10-CM

## 2024-02-07 DIAGNOSIS — N1831 Chronic kidney disease, stage 3a: Secondary | ICD-10-CM

## 2024-02-07 DIAGNOSIS — I129 Hypertensive chronic kidney disease with stage 1 through stage 4 chronic kidney disease, or unspecified chronic kidney disease: Secondary | ICD-10-CM | POA: Diagnosis not present

## 2024-02-07 DIAGNOSIS — M1711 Unilateral primary osteoarthritis, right knee: Secondary | ICD-10-CM | POA: Diagnosis not present

## 2024-02-07 DIAGNOSIS — E7800 Pure hypercholesterolemia, unspecified: Secondary | ICD-10-CM | POA: Diagnosis not present

## 2024-02-07 DIAGNOSIS — E1122 Type 2 diabetes mellitus with diabetic chronic kidney disease: Secondary | ICD-10-CM | POA: Diagnosis not present

## 2024-02-07 MED ORDER — EZETIMIBE 10 MG PO TABS: 10.0000 mg | ORAL_TABLET | Freq: Every day | ORAL | 2 refills | Status: DC

## 2024-02-07 MED ORDER — SEMAGLUTIDE(0.25 OR 0.5MG/DOS) 2 MG/3ML ~~LOC~~ SOPN: 0.2500 mg | PEN_INJECTOR | SUBCUTANEOUS | 2 refills | Status: DC

## 2024-02-07 NOTE — Assessment & Plan Note (Signed)
 Chronic, hyperlipidemia managed with current regimen. Zetia  requires refill. - Continue Repatha , Zetia , and rosuvastatin . - Send Zetia  refill to [facility] on [address].

## 2024-02-07 NOTE — Assessment & Plan Note (Signed)
 Chronic, she is having mild-moderate pain. She does not wish to pursue any joint injections at this time.Will consult with Ortho when ready to move forward with TKR.

## 2024-02-07 NOTE — Assessment & Plan Note (Signed)
 Chronic, well controlled. She will continue with carvedilol  12.5mg  tablet twice daily was sent to the pharmacy. She is encouraged to follow low sodium diet. She will rto in 4 months for re-evaluation. She is also encouraged to avoid processed meats including bacon, sausages and deli meats which tend to be high in sodium.

## 2024-02-07 NOTE — Assessment & Plan Note (Signed)
 Chronic, I will check labs as below. I will adjust meds as needed.   Managed with Jardiance  and Ozempic . Blood sugar readings in 90s mg/dL indicates good control. Compliant with medication regimen. - Continue Jardiance  and Ozempic . - Order A1c test. - Monitor blood sugar levels regularly.

## 2024-02-08 ENCOUNTER — Encounter (HOSPITAL_BASED_OUTPATIENT_CLINIC_OR_DEPARTMENT_OTHER): Admitting: Nurse Practitioner

## 2024-02-08 LAB — CMP14+EGFR
ALT: 19 IU/L (ref 0–32)
AST: 21 IU/L (ref 0–40)
Albumin: 4.1 g/dL (ref 3.8–4.8)
Alkaline Phosphatase: 99 IU/L (ref 44–121)
BUN/Creatinine Ratio: 15 (ref 12–28)
BUN: 14 mg/dL (ref 8–27)
Bilirubin Total: 0.7 mg/dL (ref 0.0–1.2)
CO2: 21 mmol/L (ref 20–29)
Calcium: 9.1 mg/dL (ref 8.7–10.3)
Chloride: 106 mmol/L (ref 96–106)
Creatinine, Ser: 0.91 mg/dL (ref 0.57–1.00)
Globulin, Total: 2.4 g/dL (ref 1.5–4.5)
Glucose: 83 mg/dL (ref 70–99)
Potassium: 3.7 mmol/L (ref 3.5–5.2)
Sodium: 142 mmol/L (ref 134–144)
Total Protein: 6.5 g/dL (ref 6.0–8.5)
eGFR: 65 mL/min/1.73 (ref >59)

## 2024-02-08 LAB — CBC
Hematocrit: 48.5 % — ABNORMAL HIGH (ref 34.0–46.6)
Hemoglobin: 15.7 g/dL (ref 11.1–15.9)
MCH: 30.1 pg (ref 26.6–33.0)
MCHC: 32.4 g/dL (ref 31.5–35.7)
MCV: 93 fL (ref 79–97)
Platelets: 187 x10E3/uL (ref 150–450)
RBC: 5.21 x10E6/uL (ref 3.77–5.28)
RDW: 13.6 % (ref 11.7–15.4)
WBC: 4.3 x10E3/uL (ref 3.4–10.8)

## 2024-02-08 LAB — LIPID PANEL
Chol/HDL Ratio: 1.5 ratio (ref 0.0–4.4)
Cholesterol, Total: 77 mg/dL — ABNORMAL LOW (ref 100–199)
HDL: 52 mg/dL (ref >39)
LDL Chol Calc (NIH): 12 mg/dL (ref 0–99)
Triglycerides: 53 mg/dL (ref 0–149)
VLDL Cholesterol Cal: 13 mg/dL (ref 5–40)

## 2024-02-08 LAB — HEMOGLOBIN A1C
Est. average glucose Bld gHb Est-mCnc: 131 mg/dL
Hgb A1c MFr Bld: 6.2 % — ABNORMAL HIGH (ref 4.8–5.6)

## 2024-02-13 ENCOUNTER — Ambulatory Visit: Payer: Self-pay | Admitting: Internal Medicine

## 2024-02-22 ENCOUNTER — Encounter (HOSPITAL_BASED_OUTPATIENT_CLINIC_OR_DEPARTMENT_OTHER): Payer: Self-pay | Admitting: Nurse Practitioner

## 2024-02-22 ENCOUNTER — Ambulatory Visit (HOSPITAL_BASED_OUTPATIENT_CLINIC_OR_DEPARTMENT_OTHER): Admitting: Nurse Practitioner

## 2024-02-22 VITALS — BP 140/70 | Ht 61.0 in | Wt 149.8 lb

## 2024-02-22 DIAGNOSIS — E119 Type 2 diabetes mellitus without complications: Secondary | ICD-10-CM | POA: Diagnosis not present

## 2024-02-22 DIAGNOSIS — I10 Essential (primary) hypertension: Secondary | ICD-10-CM | POA: Diagnosis not present

## 2024-02-22 DIAGNOSIS — E785 Hyperlipidemia, unspecified: Secondary | ICD-10-CM | POA: Diagnosis not present

## 2024-02-22 DIAGNOSIS — Z7189 Other specified counseling: Secondary | ICD-10-CM | POA: Diagnosis not present

## 2024-02-22 NOTE — Progress Notes (Unsigned)
 Cardiology Office Note   Date:  02/23/2024  ID:  Betty Arroyo, Betty Arroyo 08-Aug-1945, MRN [id number] PCP: Jarold Medici, MD  [facility] Providers Cardiologist:  None     PMH Dyslipidemia Type 2 diabetes mellitus Chronic kidney disease Hypertension   Referred to Advanced lipid disorder clinic and seen by Dr. Mona 11/01/2023.  She was on combination therapy of rosuvastatin  40 mg daily and ezetimibe  10 mg daily.  She was also on semaglutide  and successfully lost some weight.  Lipid profile showed total cholesterol 156, triglycerides 77, HDL 46, and LDL 95.  She was started on Repatha  140 mg every 14 days.  Lipid panel completed 02/07/2024 revealed total cholesterol 77, triglycerides 53, HDL 52, and LDL-C 12.  History of Present Illness Discussed the use of AI scribe software for clinical note transcription with the patient, who gave verbal consent to proceed.  History of Present Illness Betty Arroyo is a very pleasant 78 year old female who presents for follow-up of cholesterol management. She is on Repatha  every fourteen days with significant improvement in cholesterol levels and no concerning side effects. She has rhinorrhea following Repatha  administration but it is not concerning to her. She also takes rosuvastatin  40 mg and ezetimibe  10 mg daily. She denies  chest pain, shortness of breath, palpitations, orthopnea, PND, edema, presyncope, syncope. Her family history includes diabetes and a brother who had heart surgery with stents at age 34. She experiences 'funny headaches' recently, and her blood pressure was slightly elevated today but usually drops after resting. She monitors occasionally at home.   ROS: See HPI  Studies Reviewed       No results found for: LIPOA  Risk Assessment/Calculations   HYPERTENSION CONTROL Vitals:   02/22/24 1543 02/22/24 1556  BP: (!) 146/78 (!) 140/70    The patient's blood pressure is elevated above target today.  In order to  address the patient's elevated BP: Blood pressure will be monitored at home to determine if medication changes need to be made.          Physical Exam VS:  BP (!) 140/70   Ht 5' 1 (1.549 m)   Wt 149 lb 12.8 oz (67.9 kg)   BMI 28.30 kg/m    Wt Readings from Last 3 Encounters:  02/22/24 149 lb 12.8 oz (67.9 kg)  02/07/24 144 lb (65.3 kg)  11/01/23 148 lb (67.1 kg)    GEN: Well nourished, well developed in no acute distress NECK: No JVD; No carotid bruits CARDIAC: RRR, no murmurs, rubs, gallops RESPIRATORY:  Clear to auscultation without rales, wheezing or rhonchi  ABDOMEN: Soft, non-tender, non-distended EXTREMITIES:  No edema; No deformity    Assessment & Plan Hyperlipidemia LDL goal < 70 Type 2 diabetes Cardiac risk Due to history of diabetes, goal LDL 70 or lower.  Lipid panel completed 02/07/2024 with total cholesterol 77, triglycerides 53, HDL 52, and LDL-C 12. Lipids are very well controlled. A1C is 6.2%, diabetes is well controlled. She is tolerating Repatha  140 mg every 14 days, rosuvastatin  40 mg daily, and ezetimibe  10 mg daily.  She is overall feeling well with mild rhinorrhea that occurs following Repatha  injection but is not concerning to her.  She does not have any concerning cardiac symptoms and there is no indication for further ischemia evaluation.  -Discontinue ezetimibe  -Continue Repatha  and rosuvastatin  -Follow-up in 1 year, sooner if needed  Essential hypertension   BP mildly elevated at 140/70 mmHg, with occasional higher readings at home that  decrease after resting. No change in anti-hypertensive therapy today.  -Continue home monitoring and record readings to share with PCP at upcoming visit        Dispo: 1 year with Dr. Mona or me  Signed, Rosaline Bane, NP-C

## 2024-02-22 NOTE — Patient Instructions (Signed)
 Medication Instructions:   STOP TAKING ZETIA  NOW  *If you need a refill on your cardiac medications before your next appointment, please call your pharmacy*    Follow-Up: At [facility], you and your health needs are our priority.  As part of our continuing mission to provide you with exceptional heart care, our providers are all part of one team.  This team includes your primary Cardiologist (physician) and Advanced Practice Providers or APPs (Physician Assistants and Nurse Practitioners) who all work together to provide you with the care you need, when you need it.  Your next appointment:   1 year(s)  Provider:   Rosaline Bane, NP IN LIPID CLINIC

## 2024-02-23 ENCOUNTER — Encounter (HOSPITAL_BASED_OUTPATIENT_CLINIC_OR_DEPARTMENT_OTHER): Payer: Self-pay | Admitting: Nurse Practitioner

## 2024-03-18 ENCOUNTER — Other Ambulatory Visit: Payer: Self-pay | Admitting: Internal Medicine

## 2024-04-10 ENCOUNTER — Ambulatory Visit

## 2024-04-10 VITALS — BP 120/64 | HR 98 | Temp 98.6°F | Ht 61.0 in | Wt 149.0 lb

## 2024-04-10 DIAGNOSIS — Z23 Encounter for immunization: Secondary | ICD-10-CM

## 2024-04-10 NOTE — Progress Notes (Signed)
 Patient is in office today for a nurse visit for Immunization. Patient Injection was given in the  Left deltoid. Patient tolerated injection well.

## 2024-04-25 ENCOUNTER — Ambulatory Visit: Payer: Medicare PPO

## 2024-04-25 VITALS — BP 122/62 | HR 64 | Temp 98.6°F | Ht 61.0 in | Wt 143.0 lb

## 2024-04-25 DIAGNOSIS — Z0000 Encounter for general adult medical examination without abnormal findings: Secondary | ICD-10-CM | POA: Diagnosis not present

## 2024-04-25 NOTE — Patient Instructions (Signed)
 Betty Arroyo,  Thank you for taking the time for your Medicare Wellness Visit. I appreciate your continued commitment to your health goals. Please review the care plan we discussed, and feel free to reach out if I can assist you further.  Please note that Annual Wellness Visits do not include a physical exam. Some assessments may be limited, especially if the visit was conducted virtually. If needed, we may recommend an in-person follow-up with your provider.  Ongoing Care Seeing your primary care provider every 3 to 6 months helps us  monitor your health and provide consistent, personalized care.   Referrals If a referral was made during today's visit and you haven't received any updates within two weeks, please contact the referred provider directly to check on the status.  Recommended Screenings:  Health Maintenance  Topic Date Due   COVID-19 Vaccine (4 - 2025-26 season) 02/20/2024   Medicare Annual Wellness Visit  04/19/2024   Yearly kidney health urinalysis for diabetes  05/25/2024   Complete foot exam   05/25/2024   Hemoglobin A1C  08/09/2024   Eye exam for diabetics  10/12/2024   Yearly kidney function blood test for diabetes  02/06/2025   DTaP/Tdap/Td vaccine (2 - Td or Tdap) 05/05/2032   Pneumococcal Vaccine for age over 29  Completed   Flu Shot  Completed   DEXA scan (bone density measurement)  Completed   Hepatitis C Screening  Completed   Zoster (Shingles) Vaccine  Completed   Meningitis B Vaccine  Aged Out   Breast Cancer Screening  Discontinued   Colon Cancer Screening  Discontinued       04/25/2024   11:55 AM  Advanced Directives  Does Patient Have a Medical Advance Directive? Yes  Type of Estate Agent of [place];Living will  Does patient want to make changes to medical advance directive? No - Patient declined  Copy of Healthcare Power of Attorney in Chart? No - copy requested    Vision: Annual vision screenings are recommended for early  detection of glaucoma, cataracts, and diabetic retinopathy. These exams can also reveal signs of chronic conditions such as diabetes and high blood pressure.  Dental: Annual dental screenings help detect early signs of oral cancer, gum disease, and other conditions linked to overall health, including heart disease and diabetes.  Please see the attached documents for additional preventive care recommendations.

## 2024-04-25 NOTE — Progress Notes (Signed)
 Subjective:   Betty Arroyo is a 78 y.o. female who presents for a Medicare Annual Wellness Visit.  Allergies (verified) Farxiga  [dapagliflozin ]   History: 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   Right > left leg   Vertigo, benign paroxysmal    Wears glasses    Past Surgical History:  Procedure Laterality Date   COLONOSCOPY  2019   HALLUX FUSION Left 02/01/2022   Procedure: HALLUX FUSION, METATARSAL PHALANGEAL JOINT;  Surgeon: Tobie Franky SQUIBB, DPM;  Location: [facility];  Service: Podiatry;  Laterality: Left;  WITH BLOCK   TONSILLECTOMY  1984   TOTAL HIP ARTHROPLASTY 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   TOTAL KNEE ARTHROPLASTY Left 02/16/2019   Procedure: TOTAL KNEE ARTHROPLASTY;  Surgeon: Yvone Rush, MD;  Location: [facility];  Service: Orthopedics;  Laterality: Left;   VAGINAL HYSTERECTOMY     age 50   Family History  Problem Relation Age of Onset   Diabetes Mother    Hyperlipidemia Mother    Hypertension Mother    Varicose Veins Mother    Hyperlipidemia Brother    Hypertension Brother    Social History   Occupational History   Occupation: retired  Tobacco Use   Smoking status: Never   Smokeless tobacco: Never  Vaping Use   Vaping status: Never Used  Substance and Sexual Activity   Alcohol use: No   Drug use: Never   Sexual activity: Yes    Birth control/protection: Post-menopausal   Tobacco Counseling Counseling given: Not Answered  SDOH Screenings   Food Insecurity: No Food Insecurity (04/25/2024)  Housing: Unknown (04/25/2024)  Transportation Needs: No Transportation Needs (04/25/2024)  Utilities: Not At Risk (04/25/2024)  Alcohol Screen: Low Risk  (04/25/2024)  Depression (PHQ2-9): Low Risk  (04/25/2024)  Financial  Resource Strain: Low Risk  (04/25/2024)  Physical Activity: Inactive (04/25/2024)  Social Connections: Moderately Integrated (04/25/2024)  Stress: No Stress Concern Present (04/25/2024)  Tobacco Use: Low Risk  (04/25/2024)  Health Literacy: Adequate Health Literacy (04/25/2024)   Depression Screen    04/25/2024   11:53 AM 02/07/2024    9:02 AM 05/26/2023   10:31 AM 04/20/2023   11:54 AM 12/17/2022    2:56 PM 04/01/2022    9:24 AM 03/05/2021    9:37 AM  PHQ 2/9 Scores  PHQ - 2 Score 0 0 0 0 0 0 0  PHQ- 9 Score 0 0 0 0 0       Goals Addressed             This Visit's Progress    Patient Stated       04/25/2024, trying to avoid knee surgery       Visit info / Clinical Intake: Medicare Wellness Visit Type:: Subsequent Annual Wellness Visit Medicare Wellness Visit Mode:: In-person (required for WTM) Interpreter Needed?: No Pre-visit prep was completed: yes AWV questionnaire completed by patient prior to visit?: no Living arrangements:: lives with spouse/significant other Patient's Overall Health Status Rating: good Typical amount of pain: some Does pain affect daily life?: no Are you currently prescribed opioids?: no  Dietary Habits and Nutritional Risks How many meals a day?: 3 Eats fruit and vegetables daily?: yes Most meals are obtained by: preparing own meals  Diabetic:: (!) yes Any non-healing wounds?: no How often do you check your BS?: as needed (checks 2-3 weekly) Would you like to be referred to a Nutritionist or for Diabetic Management? : no  Functional Status Activities of Daily Living (to include ambulation/medication): Independent Ambulation: Independent Medication Administration: Independent Home Management: Independent Manage your own finances?: yes Primary transportation is: driving Concerns about vision?: no *vision screening is required for WTM* Concerns about hearing?: no  Fall Screening Falls in the past year?: 0 Number of falls in past year:  0 Was there an injury with Fall?: 0 Fall Risk Category Calculator: 0 Patient Fall Risk Level: Low Fall Risk  Fall Risk Patient at Risk for Falls Due to: Medication side effect Fall risk Follow up: Falls prevention discussed; Falls evaluation completed  Home and Transportation Safety: All rugs have non-skid backing?: yes All stairs or steps have railings?: yes Grab bars in the bathtub or shower?: yes Have non-skid surface in bathtub or shower?: yes Good home lighting?: yes Regular seat belt use?: yes Hospital stays in the last year:: no  Cognitive Assessment Difficulty concentrating, remembering, or making decisions? : no Will 6CIT or Mini Cog be Completed: yes What year is it?: 0 points What month is it?: 0 points Give patient an address phrase to remember (5 components): [address] About what time is it?: 0 points Count backwards from 20 to 1: 0 points Say the months of the year in reverse: 0 points Repeat the address phrase from earlier: 0 points 6 CIT Score: 0 points  Advance Directives (For Healthcare) Does Patient Have a Medical Advance Directive?: Yes Does patient want to make changes to medical advance directive?: No - Patient declined Type of Advance Directive: Healthcare Power of [place]; Living will Copy of Healthcare Power of Attorney in Chart?: No - copy requested Copy of Living Will in Chart?: No - copy requested  Reviewed/Updated  Reviewed/Updated: All        Objective:    Today's Vitals   04/25/24 1150  BP: 122/62  Pulse: 64  Temp: 98.6 F (37 C)  TempSrc: Oral  Weight: 143 lb (64.9 kg)  Height: 5' 1 (1.549 m)   Body mass index is 27.02 kg/m.  Current Medications (verified) Outpatient Encounter Medications as of 04/25/2024  Medication Sig   acetaminophen  (TYLENOL ) 500 MG tablet Take 1,000 mg by mouth every 6 (six) hours as needed.   Blood Glucose Monitoring Suppl (ACCU-CHEK GUIDE ME) w/Device KIT Use as directed to check blood  sugars daily.   carvedilol  (COREG ) 12.5 MG tablet Take 1 tablet by mouth twice daily   cholecalciferol (VITAMIN D3) 25 MCG (1000 UNIT) tablet Take 2,000 Units by mouth daily.   empagliflozin  (JARDIANCE ) 10 MG TABS tablet Take 1 tablet (10 mg total) by mouth at bedtime.   Evolocumab  (REPATHA  SURECLICK) 140 MG/ML SOAJ Inject 140 mg into the skin every 14 (fourteen) days.   glucose blood test strip USE TO CHECK BLOOD SUGAR ONCE DAILY.   Lancets (ACCU-CHEK MULTICLIX) lancets Use as directed to check blood sugar daily.   rosuvastatin  (CRESTOR ) 40 MG tablet Take 1 tablet by mouth once daily   Semaglutide ,0.25 or 0.5MG /DOS, 2 MG/3ML SOPN Inject 0.25 mg into the skin once a week.   triamcinolone  cream (KENALOG ) 0.1 % APPLY CREAM EXTERNALLY TO AFFECTED AREA TWICE DAILY   vitamin C (ASCORBIC ACID) 500 MG tablet Take 500 mg by mouth at bedtime.   meloxicam  (MOBIC ) 15 MG tablet Take 1 tablet (  15 mg total) by mouth daily. (Patient not taking: Reported on 04/25/2024)   No facility-administered encounter medications on file as of 04/25/2024.   Hearing/Vision screen Hearing Screening - Comments:: Denies hearing issues Vision Screening - Comments:: Regular eye exams, [facility], [facility] Immunizations and Health Maintenance Health Maintenance  Topic Date Due   COVID-19 Vaccine (4 - 2025-26 season) 02/20/2024   Diabetic kidney evaluation - Urine ACR  05/25/2024   FOOT EXAM  05/25/2024   HEMOGLOBIN A1C  08/09/2024   OPHTHALMOLOGY EXAM  10/12/2024   Diabetic kidney evaluation - eGFR measurement  02/06/2025   Medicare Annual Wellness (AWV)  04/25/2025   DTaP/Tdap/Td (2 - Td or Tdap) 05/05/2032   Pneumococcal Vaccine: 50+ Years  Completed   Influenza Vaccine  Completed   DEXA SCAN  Completed   Hepatitis C Screening  Completed   Zoster Vaccines- Shingrix   Completed   Meningococcal B Vaccine  Aged Out   Mammogram  Discontinued   Colonoscopy  Discontinued        Assessment/Plan:  This is a routine  wellness examination for [place].  Patient Care Team: Jarold Medici, MD as PCP - General (Internal Medicine) Rudy Dorothyann DASEN, RPH-CPP (Pharmacist)  I have personally reviewed and noted the following in the patient's chart:   Medical and social history Use of alcohol, tobacco or illicit drugs  Current medications and supplements including opioid prescriptions. Functional ability and status Nutritional status Physical activity Advanced directives List of other physicians Hospitalizations, surgeries, and ER visits in previous 12 months Vitals Screenings to include cognitive, depression, and falls Referrals and appointments  No orders of the defined types were placed in this encounter.  In addition, I have reviewed and discussed with patient certain preventive protocols, quality metrics, and best practice recommendations. A written personalized care plan for preventive services as well as general preventive health recommendations were provided to patient.   Ardella FORBES Dawn, LPN   88/09/[id number]   Return in 1 year (on 04/25/2025).  After Visit Summary: (In Person-Declined) Patient declined AVS at this time.  Nurse Notes: none

## 2024-04-26 LAB — MICROALBUMIN / CREATININE URINE RATIO: Microalb Creat Ratio: 191

## 2024-05-08 ENCOUNTER — Encounter: Payer: Self-pay | Admitting: Internal Medicine

## 2024-05-28 ENCOUNTER — Ambulatory Visit: Payer: Self-pay | Admitting: Internal Medicine

## 2024-05-28 ENCOUNTER — Encounter: Payer: Self-pay | Admitting: Internal Medicine

## 2024-05-28 VITALS — BP 110/70 | HR 64 | Temp 98.0°F | Ht 61.0 in | Wt 142.0 lb

## 2024-05-28 DIAGNOSIS — Z0000 Encounter for general adult medical examination without abnormal findings: Secondary | ICD-10-CM

## 2024-05-28 DIAGNOSIS — E7800 Pure hypercholesterolemia, unspecified: Secondary | ICD-10-CM

## 2024-05-28 DIAGNOSIS — E1122 Type 2 diabetes mellitus with diabetic chronic kidney disease: Secondary | ICD-10-CM | POA: Diagnosis not present

## 2024-05-28 DIAGNOSIS — M816 Localized osteoporosis [Lequesne]: Secondary | ICD-10-CM

## 2024-05-28 DIAGNOSIS — I129 Hypertensive chronic kidney disease with stage 1 through stage 4 chronic kidney disease, or unspecified chronic kidney disease: Secondary | ICD-10-CM

## 2024-05-28 DIAGNOSIS — N1831 Chronic kidney disease, stage 3a: Secondary | ICD-10-CM

## 2024-05-28 LAB — CMP14+EGFR
ALT: 23 IU/L (ref 0–32)
AST: 22 IU/L (ref 0–40)
Albumin: 4.5 g/dL (ref 3.8–4.8)
Alkaline Phosphatase: 101 IU/L (ref 49–135)
BUN/Creatinine Ratio: 17 (ref 12–28)
BUN: 17 mg/dL (ref 8–27)
Bilirubin Total: 0.7 mg/dL (ref 0.0–1.2)
CO2: 24 mmol/L (ref 20–29)
Calcium: 9.6 mg/dL (ref 8.7–10.3)
Chloride: 108 mmol/L — ABNORMAL HIGH (ref 96–106)
Creatinine, Ser: 0.99 mg/dL (ref 0.57–1.00)
Globulin, Total: 2.4 g/dL (ref 1.5–4.5)
Glucose: 71 mg/dL (ref 70–99)
Potassium: 3.7 mmol/L (ref 3.5–5.2)
Sodium: 146 mmol/L — ABNORMAL HIGH (ref 134–144)
Total Protein: 6.9 g/dL (ref 6.0–8.5)
eGFR: 59 mL/min/1.73 — ABNORMAL LOW (ref >59)

## 2024-05-28 LAB — POCT URINALYSIS DIP (CLINITEK)
Bilirubin, UA: NEGATIVE
Glucose, UA: 500 mg/dL — AB
Ketones, POC UA: NEGATIVE mg/dL
Leukocytes, UA: NEGATIVE
Nitrite, UA: NEGATIVE
POC PROTEIN,UA: 100 — AB
Spec Grav, UA: 1.025 (ref 1.010–1.025)
Urobilinogen, UA: NEGATIVE U/dL — AB
pH, UA: 5.5 (ref 5.0–8.0)

## 2024-05-28 LAB — HEMOGLOBIN A1C
Est. average glucose Bld gHb Est-mCnc: 128 mg/dL
Hgb A1c MFr Bld: 6.1 % — ABNORMAL HIGH (ref 4.8–5.6)

## 2024-05-28 LAB — TSH: TSH: 1.1 u[IU]/mL (ref 0.450–4.500)

## 2024-05-28 MED ORDER — SEMAGLUTIDE(0.25 OR 0.5MG/DOS) 2 MG/3ML ~~LOC~~ SOPN: 0.5000 mg | PEN_INJECTOR | SUBCUTANEOUS | 2 refills | Status: AC

## 2024-05-28 NOTE — Progress Notes (Unsigned)
 Betty Arroyo, CMA,acting as a scribe for Betty LOISE Slocumb, MD.,have documented all relevant documentation on the behalf of Betty LOISE Slocumb, MD,as directed by  Betty LOISE Slocumb, MD while in the presence of Betty LOISE Slocumb, MD.  Subjective:    Patient ID: Betty Arroyo , female    DOB: January 24, 1946 , 78 y.o.   MRN: [id number]  Chief Complaint  Patient presents with   Annual Exam    Patient presents today for HM, Patient reports compliance with medication. Patient denies any chest pain, SOB, or headaches. Patient has no concerns today.     HPI Discussed the use of AI scribe software for clinical note transcription with the patient, who gave verbal consent to proceed.  History of Present Illness Betty Arroyo is a 78 year old female with diabetes and hypertension who presents for a physical exam and concerns about elevated blood sugar levels.  She has experienced elevated A1C levels recently, with blood sugar readings of 142 mg/dL and 869 mg/dL, which are higher than her usual levels of around 120 mg/dL. She is currently on Ozempic  0.25 mg weekly. She also takes Jardiance  once daily for diabetes management.  She is actively engaging in daily walking exercises and reports no changes in her diet. She abstains from alcohol, even during the holiday season.  Her current medications include carvedilol  twice daily for blood pressure, Repatha  every two weeks, and rosuvastatin  40 mg daily. She asked about how long she will need to continue Repatha  treatment.  She has a history of osteoporosis, with prior bone density testing showing thinning in her wrist.  She reports normal bowel movements occurring three to four times a week and acknowledges not drinking much water  or other fluids.  She has a history of knee issues, with one knee being 'bone on bone' and awaiting surgery. She previously had the other knee replaced, which improved its condition.      Diabetes She presents for her follow-up  diabetic visit. She has type 2 diabetes mellitus. Her disease course has been stable. Pertinent negatives for diabetes include no blurred vision, no chest pain, no fatigue, no polydipsia, no polyphagia and no polyuria. There are no hypoglycemic complications. Diabetic complications include nephropathy. Risk factors for coronary artery disease include diabetes mellitus, dyslipidemia, hypertension and post-menopausal. Current diabetic treatment includes oral agent (monotherapy). Her weight is stable. She is following a diabetic diet. She participates in exercise intermittently. Her breakfast blood glucose is taken between 8-9 am. Her breakfast blood glucose range is generally 90-110 mg/dl. Her dinner blood glucose is taken between 5-6 pm. Her dinner blood glucose range is generally 130-140 mg/dl. An ACE inhibitor/angiotensin II receptor blocker is being taken. Eye exam is current.  Hypertension This is a chronic problem. The current episode started more than 1 year ago. The problem has been gradually improving since onset. The problem is controlled. Pertinent negatives include no blurred vision or chest pain. Past treatments include ACE inhibitors, diuretics and angiotensin blockers.     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   Right > left leg   Vertigo, benign paroxysmal    Wears glasses      Family History  Problem Relation Age of Onset  Diabetes Mother    Hyperlipidemia Mother    Hypertension Mother    Varicose Veins Mother    Hyperlipidemia Brother    Hypertension Brother      Current Outpatient Medications:    acetaminophen  (TYLENOL ) 500 MG tablet, Take 1,000 mg by mouth every 6 (six) hours as needed., Disp: , Rfl:    Blood Glucose Monitoring Suppl  (ACCU-CHEK GUIDE ME) w/Device KIT, Use as directed to check blood sugars daily., Disp: 1 kit, Rfl: 2   carvedilol  (COREG ) 12.5 MG tablet, Take 1 tablet by mouth twice daily, Disp: 180 tablet, Rfl: 0   cholecalciferol (VITAMIN D3) 25 MCG (1000 UNIT) tablet, Take 2,000 Units by mouth daily., Disp: , Rfl:    empagliflozin  (JARDIANCE ) 10 MG TABS tablet, Take 1 tablet (10 mg total) by mouth at bedtime., Disp: 90 tablet, Rfl: 2   Evolocumab  (REPATHA  SURECLICK) 140 MG/ML SOAJ, Inject 140 mg into the skin every 14 (fourteen) days., Disp: 2 mL, Rfl: 11   glucose blood test strip, USE TO CHECK BLOOD SUGAR ONCE DAILY., Disp: 100 each, Rfl: 2   Lancets (ACCU-CHEK MULTICLIX) lancets, Use as directed to check blood sugar daily., Disp: 100 each, Rfl: 12   rosuvastatin  (CRESTOR ) 40 MG tablet, Take 1 tablet by mouth once daily, Disp: 90 tablet, Rfl: 2   Semaglutide ,0.25 or 0.5MG /DOS, 2 MG/3ML SOPN, Inject 0.25 mg into the skin once a week., Disp: 6 mL, Rfl: 2   triamcinolone  cream (KENALOG ) 0.1 %, APPLY CREAM EXTERNALLY TO AFFECTED AREA TWICE DAILY, Disp: 30 g, Rfl: 0   vitamin C (ASCORBIC ACID) 500 MG tablet, Take 500 mg by mouth at bedtime., Disp: , Rfl:    meloxicam  (MOBIC ) 15 MG tablet, Take 1 tablet (15 mg total) by mouth daily. (Patient not taking: Reported on 05/28/2024), Disp: 30 tablet, Rfl: 1   Allergies  Allergen Reactions   Farxiga  [Dapagliflozin ] Rash    The patient states she uses none for birth control. No LMP recorded. Patient has had a hysterectomy.. Negative for Dysmenorrhea. Negative for: breast discharge, breast lump(s), breast pain and breast self exam. Associated symptoms include abnormal vaginal bleeding. Pertinent negatives include abnormal bleeding (hematology), anxiety, decreased libido, depression, difficulty falling sleep, dyspareunia, history of infertility, nocturia, sexual dysfunction, sleep disturbances, urinary incontinence, urinary urgency, vaginal discharge and vaginal itching. Diet  regular.The patient states her exercise level is  moderate  . The patient's tobacco use is:  Social History   Tobacco Use  Smoking Status Never  Smokeless Tobacco Never  . She has been exposed to passive smoke. The patient's alcohol use is:  Social History   Substance and Sexual Activity  Alcohol Use No    Review of Systems  Constitutional: Negative.  Negative for fatigue.  HENT:  Positive for postnasal drip.   Eyes: Negative.  Negative for blurred vision.  Respiratory: Negative.    Cardiovascular: Negative.  Negative for chest pain.  Gastrointestinal: Negative.   Endocrine: Negative.  Negative for polydipsia, polyphagia and polyuria.  Genitourinary: Negative.   Musculoskeletal: Negative.   Skin: Negative.   Allergic/Immunologic: Negative.   Neurological: Negative.   Hematological: Negative.   Psychiatric/Behavioral: Negative.       Today's Vitals   05/28/24 1410  BP: 110/70  Pulse: 64  Temp: 98 F (36.7 C)  TempSrc: Oral  Weight: 142 lb (64.4 kg)  Height: 5' 1 (1.549 m)  PainSc: 0-No pain   Body mass index is 26.83 kg/m.  Wt Readings from Last 3 Encounters:  05/28/24  142 lb (64.4 kg)  04/25/24 143 lb (64.9 kg)  04/10/24 149 lb (67.6 kg)     Objective:  Physical Exam      Assessment And Plan:     Encounter for annual health examination  Type 2 diabetes mellitus with stage 3a chronic kidney disease, without long-term current use of insulin  (HCC) -     EKG 12-Lead -     POCT URINALYSIS DIP (CLINITEK) -     Microalbumin / creatinine urine ratio  Pure hypercholesterolemia  Parenchymal renal hypertension, stage 1 through stage 4 or unspecified chronic kidney disease   Assessment & Plan Adult Wellness Visit Routine visit with no acute concerns. EKG normal. Bone density scan showed osteoporosis. Bowel movements normal. Low water  intake reported. - Encouraged increased water  intake.  Type 2 diabetes mellitus Recent increase in blood glucose levels.  Discussed increasing Ozempic  to improve control. - Increased Ozempic  to 0.5 mg weekly. - Sent prescription for Ozempic  to [facility] on [address]. - Ensured a 90-day supply of Ozempic  is available.  Hypertension Managed with carvedilol . - Continue carvedilol  twice daily.  Pure hypercholesterolemia Managed with Repatha  and rosuvastatin . Discussed potential long-term use of Repatha . - Continue Repatha  every two weeks. - Continue rosuvastatin  40 mg daily.  Localized osteoporosis of wrist Severe thinning of wrist bone. Discussed referral for treatment options to prevent fractures. Emphasized weight-bearing exercises. - Referred to osteoporosis clinic for treatment options. - Encouraged weight-bearing exercises and strength training.  Knee osteoarthritis, right knee, status post replacement Right knee replacement. Left knee pain requires future intervention. Awaiting bilateral knee surgery. - Plan for bilateral knee surgery. Return for 1 year physical, controlled DM check 4 months. Patient was given opportunity to ask questions. Patient verbalized understanding of the plan and was able to repeat key elements of the plan. All questions were answered to their satisfaction.    I, Betty LOISE Slocumb, MD, have reviewed all documentation for this visit. The documentation on 05/28/24 for the exam, diagnosis, procedures, and orders are all accurate and complete.

## 2024-05-29 LAB — MICROALBUMIN / CREATININE URINE RATIO
Creatinine, Urine: 74.1 mg/dL
Microalb/Creat Ratio: 256 mg/g{creat} — ABNORMAL HIGH (ref 0–29)
Microalbumin, Urine: 189.5 ug/mL

## 2024-05-30 ENCOUNTER — Ambulatory Visit: Payer: Self-pay | Admitting: Internal Medicine

## 2024-06-02 DIAGNOSIS — M816 Localized osteoporosis [Lequesne]: Secondary | ICD-10-CM | POA: Insufficient documentation

## 2024-06-02 NOTE — Assessment & Plan Note (Addendum)
 She is s/p right knee replacement. Left knee pain requires future intervention. Awaiting left knee surgery.

## 2024-06-02 NOTE — Assessment & Plan Note (Signed)

## 2024-06-02 NOTE — Assessment & Plan Note (Addendum)
 Chronic, well controlled. She will continue with carvedilol  12.5mg  tablet twice daily.  - She is encouraged to follow low sodium diet.  - EKG performed, NSR w/o acute changes. - She will rto in 4 months for re-evaluation. She is also encouraged to avoid processed meats including bacon, sausages and deli meats which tend to be high in sodium.  - Also followed by [facility] for CKD

## 2024-06-02 NOTE — Assessment & Plan Note (Signed)
 Chronic, diabetic foot exam was performed. Recent increase in blood glucose levels. Discussed increasing Ozempic  to improve control. - Increased Ozempic  to 0.5 mg weekly. - Sent prescription for Ozempic  to [facility] on [address]. - Ensured a 90-day supply of Ozempic  is available. - I DISCUSSED WITH THE PATIENT AT LENGTH REGARDING THE GOALS OF GLYCEMIC CONTROL AND POSSIBLE LONG-TERM COMPLICATIONS.  I  ALSO STRESSED THE IMPORTANCE OF COMPLIANCE WITH HOME GLUCOSE MONITORING, DIETARY RESTRICTIONS INCLUDING AVOIDANCE OF SUGARY DRINKS/PROCESSED FOODS,  ALONG WITH REGULAR EXERCISE.  I  ALSO STRESSED THE IMPORTANCE OF ANNUAL EYE EXAMS, SELF FOOT CARE AND COMPLIANCE WITH OFFICE VISITS.

## 2024-06-02 NOTE — Assessment & Plan Note (Signed)
 Chronic, LDL goal is less than 70.  Managed with Repatha  and rosuvastatin . Discussed potential long-term use of Repatha . - Continue Repatha  every two weeks. - Continue rosuvastatin  40 mg daily.

## 2024-06-02 NOTE — Assessment & Plan Note (Signed)
 Severe thinning of wrist bone. Discussed referral for treatment options to prevent fractures. Emphasized weight-bearing exercises. - Referred to osteoporosis clinic for treatment options. - Encouraged weight-bearing exercises and strength training.

## 2024-06-04 ENCOUNTER — Encounter: Payer: Self-pay | Admitting: Physician Assistant

## 2024-06-04 ENCOUNTER — Ambulatory Visit: Admitting: Physician Assistant

## 2024-06-04 VITALS — Ht 61.0 in | Wt 152.4 lb

## 2024-06-04 DIAGNOSIS — M816 Localized osteoporosis [Lequesne]: Secondary | ICD-10-CM

## 2024-06-04 NOTE — Progress Notes (Signed)
 Office Visit Note   Patient: Betty Arroyo           Date of Birth: February 19, 1946           MRN: [id number] Visit Date: 06/04/2024              Requested by: Jarold Medici, MD [address] [place],  [place] [id number] PCP: Jarold Medici, MD   Assessment & Plan: Visit Diagnoses:  1. Localized osteoporosis without current pathological fracture     Plan: Patient is a pleasant 78 year old woman is in referral from Dr. Jarold for evaluation of osteoporosis.  She had a bone density scan earlier this year that demonstrated a radial bone density of -3.8 T-score.  She had osteopenia in her hip.  And spine.  Earlier this year she was recommended Fosamax but she does have a history of some kidney disease.  Currently her kidney function is good.  She has never actually had a fracture of her wrist.  She does not take any medications for osteoporosis no history of heart disease or cancer.  She does have some history of kidney disease no history of ulcers gastric bypass or severe reflux.  No history of seizures.  She does not really take calcium  but does take 1 tablet of vitamin D  a day.  She has never been a smoker she does not drink she does walking for exercise she does have regular dental work done no family history of osteoporotic fracture.  It is concerning of the significant osteoporosis in her wrist.  This obviously makes her higher risk for fractures.  We discussed tracking her calcium  and vitamin D .  Will draw vitamin D  today to see where she is at.  She understands to track her calcium  level given her food list to do this.  With regards to exercise we talked about adding some resistance exercises to her routine.  From a medical management I think the best option would be Prolia.  Could also be Jubbonti.  Cannot really do bisphosphonates because of her issues with kidney disease in the past.  We talked about the side effects how this medication is administered questions were answered.   She is going to think about this in the meantime we will draw vitamin D  today.  I spent 45 minutes reviewing her chart discussing treatment options talking about lifestyle  Follow-Up Instructions: No follow-ups on file.   Orders:  No orders of the defined types were placed in this encounter.  No orders of the defined types were placed in this encounter.     Procedures: No procedures performed   Clinical Data: No additional findings.   Subjective: No chief complaint on file.   HPI patient is a pleasant 78 year old woman presents today for evaluation of osteoporosis referred by Dr. Jarold  Review of Systems  All other systems reviewed and are negative.    Objective: Vital Signs: There were no vitals taken for this visit.  Physical Exam Constitutional:      Appearance: Normal appearance.  Skin:    General: Skin is warm and dry.  Neurological:     General: No focal deficit present.     Mental Status: She is alert and oriented to person, place, and time.  Psychiatric:        Mood and Affect: Mood normal.        Behavior: Behavior normal.       Specialty Comments:  No specialty comments available.  Imaging: No results  found.   PMFS History: Patient Active Problem List   Diagnosis Date Noted   Localized osteoporosis without current pathological fracture 06/02/2024   Primary osteoarthritis of right knee 02/07/2024   Estrogen deficiency 09/28/2023   Left foot pain 09/28/2023   Postnasal drip 06/01/2023   Encounter for annual health examination 05/30/2023   Preop cardiovascular exam 01/06/2023   Mixed hyperlipidemia 01/06/2023   Essential hypertension 01/06/2023   Localized swelling of toe of left foot 05/09/2022   Nasal congestion with rhinorrhea 05/11/2021   Vaginal candidiasis 05/11/2021   Primary osteoarthritis of left knee 02/16/2019   Parenchymal renal hypertension 07/24/2018   Type 2 diabetes mellitus with stage 3 chronic kidney disease, without  long-term current use of insulin  (HCC) 07/24/2018   Pure hypercholesterolemia 07/24/2018   Body mass index (BMI) of 29.0 to 29.9 in adult 07/24/2018   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   Right > left leg   Vertigo, benign paroxysmal    Wears glasses     Family History  Problem Relation Age of Onset   Diabetes Mother    Hyperlipidemia Mother    Hypertension Mother    Varicose Veins Mother    Hyperlipidemia Brother    Hypertension Brother     Past Surgical History:  Procedure Laterality Date   COLONOSCOPY  2019   HALLUX FUSION Left 02/01/2022   Procedure: HALLUX FUSION, METATARSAL PHALANGEAL JOINT;  Surgeon: Tobie Franky SQUIBB, DPM;  Location: [facility];  Service: Podiatry;  Laterality: Left;  WITH BLOCK   TONSILLECTOMY  1984   TOTAL HIP ARTHROPLASTY 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   TOTAL KNEE ARTHROPLASTY Left 02/16/2019   Procedure: TOTAL KNEE ARTHROPLASTY;  Surgeon: Yvone Rush, MD;  Location: [facility];  Service: Orthopedics;  Laterality: Left;   VAGINAL HYSTERECTOMY     age 18   Social History   Occupational History   Occupation: retired  Tobacco Use   Smoking status: Never   Smokeless tobacco: Never  Vaping Use   Vaping status: Never Used  Substance and Sexual Activity   Alcohol use: No   Drug use: Never   Sexual activity: Yes    Birth control/protection: Post-menopausal

## 2024-06-05 ENCOUNTER — Ambulatory Visit: Payer: Self-pay | Admitting: Physician Assistant

## 2024-06-05 LAB — EXTRA LAV TOP TUBE

## 2024-06-05 LAB — VITAMIN D 25 HYDROXY (VIT D DEFICIENCY, FRACTURES): Vit D, 25-Hydroxy: 63 ng/mL (ref 30–100)

## 2024-06-16 ENCOUNTER — Other Ambulatory Visit: Payer: Self-pay | Admitting: Internal Medicine

## 2024-10-01 ENCOUNTER — Ambulatory Visit: Admitting: Internal Medicine

## 2025-05-22 ENCOUNTER — Ambulatory Visit: Payer: Self-pay

## 2025-05-30 ENCOUNTER — Encounter: Admitting: Internal Medicine
